# Patient Record
Sex: Female | Born: 1955 | Race: Black or African American | Hispanic: No | Smoking: Never smoker
Health system: Southern US, Community
[De-identification: ages and names within clinical notes are randomized; demographics above are authoritative.]

## PROBLEM LIST (undated history)

## (undated) DIAGNOSIS — Z973 Presence of spectacles and contact lenses: Secondary | ICD-10-CM

## (undated) DIAGNOSIS — K219 Gastro-esophageal reflux disease without esophagitis: Secondary | ICD-10-CM

## (undated) DIAGNOSIS — J45909 Unspecified asthma, uncomplicated: Secondary | ICD-10-CM

## (undated) DIAGNOSIS — E785 Hyperlipidemia, unspecified: Secondary | ICD-10-CM

## (undated) DIAGNOSIS — R011 Cardiac murmur, unspecified: Secondary | ICD-10-CM

## (undated) DIAGNOSIS — D649 Anemia, unspecified: Secondary | ICD-10-CM

## (undated) DIAGNOSIS — G4730 Sleep apnea, unspecified: Secondary | ICD-10-CM

## (undated) DIAGNOSIS — R5383 Other fatigue: Secondary | ICD-10-CM

## (undated) DIAGNOSIS — R0602 Shortness of breath: Secondary | ICD-10-CM

## (undated) DIAGNOSIS — I10 Essential (primary) hypertension: Secondary | ICD-10-CM

## (undated) DIAGNOSIS — C801 Malignant (primary) neoplasm, unspecified: Secondary | ICD-10-CM

## (undated) DIAGNOSIS — T7840XA Allergy, unspecified, initial encounter: Secondary | ICD-10-CM

## (undated) DIAGNOSIS — Z85038 Personal history of other malignant neoplasm of large intestine: Secondary | ICD-10-CM

## (undated) DIAGNOSIS — M549 Dorsalgia, unspecified: Secondary | ICD-10-CM

## (undated) DIAGNOSIS — E559 Vitamin D deficiency, unspecified: Secondary | ICD-10-CM

## (undated) DIAGNOSIS — D219 Benign neoplasm of connective and other soft tissue, unspecified: Secondary | ICD-10-CM

## (undated) DIAGNOSIS — M25475 Effusion, left foot: Secondary | ICD-10-CM

## (undated) DIAGNOSIS — E119 Type 2 diabetes mellitus without complications: Secondary | ICD-10-CM

## (undated) DIAGNOSIS — K635 Polyp of colon: Secondary | ICD-10-CM

## (undated) HISTORY — DX: Benign neoplasm of connective and other soft tissue, unspecified: D21.9

## (undated) HISTORY — DX: Dorsalgia, unspecified: M54.9

## (undated) HISTORY — DX: Hyperlipidemia, unspecified: E78.5

## (undated) HISTORY — DX: Other fatigue: R53.83

## (undated) HISTORY — DX: Unspecified asthma, uncomplicated: J45.909

## (undated) HISTORY — DX: Gastro-esophageal reflux disease without esophagitis: K21.9

## (undated) HISTORY — PX: MYOMECTOMY: SHX85

## (undated) HISTORY — DX: Shortness of breath: R06.02

## (undated) HISTORY — DX: Essential (primary) hypertension: I10

## (undated) HISTORY — DX: Vitamin D deficiency, unspecified: E55.9

## (undated) HISTORY — PX: ABDOMINAL HYSTERECTOMY: SHX81

## (undated) HISTORY — DX: Malignant (primary) neoplasm, unspecified: C80.1

## (undated) HISTORY — DX: Allergy, unspecified, initial encounter: T78.40XA

## (undated) HISTORY — DX: Personal history of other malignant neoplasm of large intestine: Z85.038

## (undated) HISTORY — DX: Anemia, unspecified: D64.9

## (undated) HISTORY — DX: Effusion, left foot: M25.475

---

## 1998-04-17 ENCOUNTER — Other Ambulatory Visit: Admission: RE | Admit: 1998-04-17 | Discharge: 1998-04-17 | Payer: Self-pay | Admitting: Obstetrics and Gynecology

## 1999-10-09 ENCOUNTER — Encounter: Payer: Self-pay | Admitting: Emergency Medicine

## 1999-10-09 ENCOUNTER — Encounter: Admission: RE | Admit: 1999-10-09 | Discharge: 1999-10-09 | Payer: Self-pay | Admitting: Emergency Medicine

## 1999-10-25 ENCOUNTER — Encounter: Admission: RE | Admit: 1999-10-25 | Discharge: 1999-10-25 | Payer: Self-pay | Admitting: Emergency Medicine

## 1999-10-25 ENCOUNTER — Encounter: Payer: Self-pay | Admitting: Emergency Medicine

## 2001-04-21 ENCOUNTER — Other Ambulatory Visit: Admission: RE | Admit: 2001-04-21 | Discharge: 2001-04-21 | Payer: Self-pay | Admitting: Obstetrics and Gynecology

## 2001-05-27 ENCOUNTER — Encounter: Payer: Self-pay | Admitting: Obstetrics and Gynecology

## 2001-05-27 ENCOUNTER — Encounter: Admission: RE | Admit: 2001-05-27 | Discharge: 2001-05-27 | Payer: Self-pay | Admitting: Obstetrics and Gynecology

## 2001-07-14 ENCOUNTER — Inpatient Hospital Stay (HOSPITAL_COMMUNITY): Admission: RE | Admit: 2001-07-14 | Discharge: 2001-07-16 | Payer: Self-pay | Admitting: Obstetrics and Gynecology

## 2001-07-14 ENCOUNTER — Encounter (INDEPENDENT_AMBULATORY_CARE_PROVIDER_SITE_OTHER): Payer: Self-pay | Admitting: Specialist

## 2002-02-14 ENCOUNTER — Inpatient Hospital Stay (HOSPITAL_COMMUNITY): Admission: AD | Admit: 2002-02-14 | Discharge: 2002-02-14 | Payer: Self-pay | Admitting: *Deleted

## 2002-10-12 ENCOUNTER — Encounter: Payer: Self-pay | Admitting: Obstetrics and Gynecology

## 2002-10-12 ENCOUNTER — Encounter: Admission: RE | Admit: 2002-10-12 | Discharge: 2002-10-12 | Payer: Self-pay | Admitting: Obstetrics and Gynecology

## 2003-10-21 ENCOUNTER — Encounter: Admission: RE | Admit: 2003-10-21 | Discharge: 2003-10-21 | Payer: Self-pay | Admitting: Obstetrics and Gynecology

## 2004-11-21 ENCOUNTER — Other Ambulatory Visit: Admission: RE | Admit: 2004-11-21 | Discharge: 2004-11-21 | Payer: Self-pay | Admitting: Obstetrics and Gynecology

## 2004-12-13 ENCOUNTER — Encounter: Admission: RE | Admit: 2004-12-13 | Discharge: 2004-12-13 | Payer: Self-pay | Admitting: Internal Medicine

## 2005-03-26 ENCOUNTER — Encounter: Admission: RE | Admit: 2005-03-26 | Discharge: 2005-03-26 | Payer: Self-pay | Admitting: Allergy and Immunology

## 2005-04-10 ENCOUNTER — Ambulatory Visit (HOSPITAL_COMMUNITY): Admission: RE | Admit: 2005-04-10 | Discharge: 2005-04-10 | Payer: Self-pay | Admitting: Allergy and Immunology

## 2005-05-21 ENCOUNTER — Encounter: Admission: RE | Admit: 2005-05-21 | Discharge: 2005-05-21 | Payer: Self-pay | Admitting: Allergy and Immunology

## 2005-12-06 ENCOUNTER — Other Ambulatory Visit: Admission: RE | Admit: 2005-12-06 | Discharge: 2005-12-06 | Payer: Self-pay | Admitting: Internal Medicine

## 2005-12-26 ENCOUNTER — Encounter: Admission: RE | Admit: 2005-12-26 | Discharge: 2005-12-26 | Payer: Self-pay | Admitting: Internal Medicine

## 2006-12-29 ENCOUNTER — Encounter: Admission: RE | Admit: 2006-12-29 | Discharge: 2006-12-29 | Payer: Self-pay | Admitting: Obstetrics & Gynecology

## 2007-02-24 ENCOUNTER — Encounter: Admission: RE | Admit: 2007-02-24 | Discharge: 2007-02-24 | Payer: Self-pay | Admitting: Obstetrics and Gynecology

## 2007-11-12 ENCOUNTER — Other Ambulatory Visit: Admission: RE | Admit: 2007-11-12 | Discharge: 2007-11-12 | Payer: Self-pay | Admitting: Obstetrics and Gynecology

## 2010-09-21 ENCOUNTER — Encounter
Admission: RE | Admit: 2010-09-21 | Discharge: 2010-09-21 | Payer: Self-pay | Source: Home / Self Care | Attending: Family Medicine | Admitting: Family Medicine

## 2011-01-25 NOTE — H&P (Signed)
[facility]  Patient:    Sharon Allison, Sharon Allison Visit Number: [id number] MRN: [id number]          Service Type: GYN Location: 9300 [id number] Attending Physician:  Jenean Lindau Dictated by:   Laqueta Linden, M.D. Admit Date:  07/14/2001                           History and Physical  IDENTIFYING DATA:             The patient is a 55 year old nulligravida black female with recurrent symptomatic fibroids admitted for total abdominal hysterectomy.  HISTORY OF PRESENT ILLNESS:   The patient is a 55 year old gravida 0, single, African-American female who was initially seen by this physician back in 1997 for multiple large symptomatic fibroids.  At that time, she desired to retain childbearing capabilities.  She subsequently underwent surgery with multiple myomectomies performed in May 1997 with removal of a total of 100 g of fibroids.  She was followed for a couple of years after that and did fairly well with no evidence of regrowth at her last visit in August 1999.  She was then lost to followup and presented again in August of this year.  At that time she reported a six-month history of worsening debilitating menorrhagia with multiple accidents and soak-throughs, especially on days #1 and #2.  At this point, she was almost 55 years of age and determined that she now longer desired childbearing capabilities.  She was anemic with a hemoglobin of 11 and had a fibroid which confirmed multiple serosal and intramural fibroids. Various alternatives were discussed including the possible use of combination versus progesterone-only pills, repeat myomectomy, evaluation for submucosal fibroids with endometrial resection if appropriate, versus hysterectomy.  Due to the patients age and recurrence of fibroids and desire for definitive surgery, she elected to proceed to hysterectomy.  She has been extensively counselled as to the risks, benefits, and alternatives, and  complications including the fact that this would render her permanently and irreversibly sterilized.  She has voiced her understanding and acceptance of all of these risks and is admitted now for same-day surgery.  PAST MEDICAL HISTORY:         1. Iron-deficiency anemia.                               2. Atypical chest, arm, and leg pain with                                  negative evaluation per [facility].  PAST SURGICAL HISTORY:        In 1997, multiple myomectomies as noted above.  PAST OBSTETRICAL HISTORY:     Nulligravida.  PAST GYNECOLOGIC HISTORY:     As noted above.  Pap smear in August 2002 was within normal limits.  Mammogram in September 2002 within normal limits.  STD testing including GC and chlamydia probe were negative as well.  ALLERGIES:                    SULFA causes itching.  IVP DYE causes facial  swelling.  No latex sensitivities.  CURRENT MEDICATIONS:          Multivitamins with iron daily with an additional iron supplement.  TRANSFUSION HISTORY:          Negative.  SOCIAL HISTORY:               The patient is divorced for 10 years.  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  She denies any smoking, alcohol, or illicit drug use.  FAMILY HISTORY:               Positive for hypertension and diabetes in her mother with diabetes also in her grandmother.  Father deceased at the age of 33 of lupus.  One brother is alive and well at the age of 32, one sister, at the age of 29, with anemia.   Otherwise noncontributory.  REVIEW OF SYSTEMS:            Notable as per History of Present Illness and some history of fatigue which she relates to anemia, left-sided body discomfort and numbness with a negative evaluation per her primary care physician which has gradually gotten better per the patients report.  She denies any urinary symptoms, bowel complaints,  cardiovascular, respiratory, or neurologic complaints other than as noted above.  PHYSICAL EXAMINATION: (Prior to admission)  GENERAL:                      Obese black female in no apparent distress.  VITAL SIGNS:                  Height 5 feet 3-1/4 inches, weight 201 pounds. Blood pressure 134/84.  HEENT:                        Grossly negative with slight exophthalmos noted.  NECK:                         Supple without thyromegaly or lymphadenopathy.  CHEST:                        Without spine or CVA tenderness.  HEART:                        Regular rate and rhythm without murmurs, gallops, or rubs.  Carotids +2 and equal without bruit, distal pulses full.  LUNGS:                        Clear to auscultation.  BREASTS:                      Without masses.  ABDOMEN:                      Soft without hepatosplenomegaly.  Pfannenstiel scar well healed.  Suprapubic mass appreciated.  PELVIC:                       A 12-week, irregular, poorly mobile uterus with no obvious adnexal masses.  Rectovaginal confirmatory.  EXTREMITIES:                  Without edema.  NEUROLOGIC:                   Grossly  nonfocal.  LABORATORY DATA:              Studies on admission reveal hemoglobin 12.8, hematocrit 36.9, platelets 312, normal white count of 7.1.  Comprehensive metabolic profile within normal limits with a slightly decreased potassium at 3.4.  Normal coagulation studies.  Urinalysis negative.  EKG and chest x-ray deferred per anesthesia.  IMPRESSION ON ADMISSION:      1. Multiple fibroids status post distant                                  myomectomy with recurrent menorrhagia.                               2. The patient is 55 years old, no desire for                                  childbearing at this time.                               3. Mild anemia, responsive to iron therapy.  PLAN:                         The patient is admitted for same-day surgery. She will  undergo a TAH under general anesthesia.  She has been extensively  counselled as to the risks, benefits, alternatives, and complications and agrees to proceed.  She will receive antibiotic prophylaxis preoperatively. Dictated by:   Laqueta Linden, M.D. Attending Physician:  Jenean Lindau DD:  07/14/01 TD:  07/14/01 Job: [id number]

## 2011-01-25 NOTE — Op Note (Signed)
[facility] [place]  Patient:    Sharon Allison, Sharon Allison Visit Number: [id number] MRN: [id number]          Service Type: GYN Location: 9300 [id number] Attending Physician:  Jenean Lindau Dictated by:   Laqueta Linden, M.D. Proc. Date: 07/14/01 Admit Date:  07/14/2001   CC:         [facility]   Operative Report  PREOPERATIVE DIAGNOSES:       Recurrent leiomyomata uteri.  POSTOPERATIVE DIAGNOSES:      Recurrent leiomyomata uteri, pelvic adhesions.  PROCEDURE:                    Total abdominal hysterectomy with lysis of adhesions.  SURGEON:                      Laqueta Linden, M.D.  ASSISTANT:                    Cheryle Horsfall, M.D.  ANESTHESIA:                   General endotracheal.  ESTIMATED BLOOD LOSS:         100 cc.  URINE OUTPUT:                 500 cc.  FLUIDS:                       1500 cc Crystalloid.  COUNTS:                       Correct x 2.  COMPLICATIONS:                None.  INDICATIONS:                  Sharon Allison is a 55 year old gravida 0 African-American woman who underwent multiple myomectomies by the surgeon in 1997 for multiple large fibroids and menorrhagia.  At that time she desired conservative management due to desire for retention of childbearing capacity. She was lost to followup for several years and presented again in August 2002 at the age of almost 55 reporting that her periods have become prohibitively heavy over the preceding six months which were debilitating with frequent accidents.  Her hemoglobin was noted to be 11 at that time.  She underwent ultrasound which confirmed the presence of multiple small fibroids diffusely involving the uterus.  Both ovaries appeared normal.  A discussion was held with the patient regarding various management alternatives.  A trial of low dose birth control pills to control her menses versus progesterone only pill versus repeat myomectomy versus hysterectomy  were all discussed.  At this point and at this age, she has determined that she will not be having children regardless and elects to proceed to definitive surgical management.  She has been extensively counseled as to the risks, benefits, alternatives, and complications of the procedure including, but not limited to, anesthesia risks, infection, bleeding, possibly requiring transfusion, injury to bowel, bladder, ureters, vessels, nerves, possibility of adhesions from the prior surgery making injury slightly more likely, possibility of fistula formation between the bladder or rectum or small bowel and vagina, postoperative expectations regarding recovery time, sexual functioning, return to work, as well as the permanent and irreversible sterilization as well as risks of DVT, PE, pneumonia, death, and other unnamed risks.  She has seen the informed consent  film and voiced her understanding and acceptance of the above named risks and agrees to proceed.  She has signed the informed permanent sterilization consent as well.  PROCEDURE:                    After proper identification and consents were ascertained she was taken to the operating room and placed on the operating table in supine position.  After the injection of general endotracheal anesthesia she was placed in the broadway position and the abdomen, perineum, and vagina were prepped and draped in a routine sterile fashion.  A transurethral Foley was placed.  She was returned to the supine position and drape was placed appropriately.  A Pfannenstiel incision was then made through the patients prior incision.  This was carried down to the level of the anterior rectus fascia.  The fascia was incised and the incision extended laterally superiorly and inferiorly.  The parietoperitoneum was elevated and incised and the incision extended superiorly and inferiorly to the level of the bladder.  There were noted to be some filmy anterior  peritoneal adhesions to the left side of the incision which were not obstructive in any way to the surgery and these were left in place.  The appendiceal tip was visualized and had no lesion or stone.  It did have some surrounding adhesions.  Upper abdomen was not palpated due to the adhesions around the incision.  Patient was placed in Trendelenburg and a self retaining retractor was placed.  Sharp dissection of small bowel and omental adhesions to the uterine fundus, posterior lower uterine segment were then carefully sharply lysed to mobilize the uterus.  There was also noted to be an anterior peritoneal adhesion of the bladder to the upper portion of the anterior aspect of the uterus which was also carefully lysed.  The uterus was distorted by multiple serosal and intramural fibroids.  Both tubes and ovaries appeared completely normal.  The uterus was elevated in the operative field after mobilization with lysis of adhesions.  The round ligaments were clamped, cut, and suture ligated bilaterally and tagged.  Dissection was carried forward in the anterior leaf of the broad ligament with dissection of the bladder off of the anterior lower uterine segment and cervix.  A window was made in the posterior broad ligament and the proximal utero-ovarian ligament and fallopian tube were then clamped with the pedicle cut and doubly ligated with a free tie and a stitch of 0 Vicryl with retention of both adnexa.  This procedure was performed on both sides.  The uterine vessels were then skeletonized bilaterally and curved Heaney clamps placed perpendicularly across the vessels at the level of the internal os.  Care was taken to advance the bladder well off of the cervix prior to proceeding with the dissection of the cervix.  Pedicles were cut and suture ligated with 0 Vicryl.  A straight Heaney clamp was then placed across the upper portion of the cardinal ligament with the pedicle cut and  suture ligated such that the uterine vessels were doubly ligated within this pedicle  as well.  Additional straight and then curved Heaney clamps were placed across the cardinal ligaments and upper vaginal angles bilaterally.  Satinsky scissors were then used to circumscribe the cervix with removal of the specimen which was sent for final sectioning.  Richardson angle sutures were placed bilaterally with excellent hemostasis noted.  Several additional figure-of-eight sutures of 0 Vicryl were placed with closure of the upper vagina.  Several small bleeding points were cauterized.  The uterosacral ligament tags were tied together to prevent enterocele formation.  Both ureters were visualized and noted to be of normal caliber and peristalsing normally throughout the procedure.  Copious lavage was again accomplished. Both adnexa were suspended to its lateral round ligaments.  A window in the peritoneum on the left was closed to prevent incarceration of bowel or omentum.  All pedicles were noted to be hemostatic.  The vaginal cuff was hemostatic as was the bladder dissection site.  Urine remained clear and copious throughout the procedure.  All instruments and lap packs were then removed.  Needle, sponge, and instrument counts were correct prior to closure of the abdomen.  Parietoperitoneum was closed in a running fashion using 2-0 Vicryl suture taking care to avoid the adhesions along the left aspect which were left in place as previously noted.  The rectus muscles were loosely reapproximated in the midline.  Subfascial hemostasis was ascertained and the fascia was closed from both lateral aspects of the midline using a running stitch of 0 Maxon.  Subcutaneous hemostasis was ascertained.  The skin was closed with staples and Steri-Strips and pressure dressing applied.  Patient was stable on transfer to the recovery room.  Estimated blood loss 100 cc. Urine output 500 cc.  Fluids:  1500 cc  Crystalloid.  Counts correct x 2. Complications:  None.Dictated by:   Laqueta Linden, M.D. Attending Physician:  Jenean Lindau DD:  07/14/01 TD:  07/15/01 Job: [id number]

## 2011-10-11 ENCOUNTER — Other Ambulatory Visit: Payer: Self-pay | Admitting: Family Medicine

## 2011-10-11 DIAGNOSIS — Z1231 Encounter for screening mammogram for malignant neoplasm of breast: Secondary | ICD-10-CM

## 2011-10-23 ENCOUNTER — Ambulatory Visit
Admission: RE | Admit: 2011-10-23 | Discharge: 2011-10-23 | Disposition: A | Discharge status: Home / Self Care | Payer: BC Managed Care – PPO | Source: Ambulatory Visit | Attending: Family Medicine | Admitting: Family Medicine

## 2011-10-23 DIAGNOSIS — Z1231 Encounter for screening mammogram for malignant neoplasm of breast: Secondary | ICD-10-CM

## 2012-12-14 ENCOUNTER — Other Ambulatory Visit: Payer: Self-pay

## 2012-12-14 DIAGNOSIS — Z1231 Encounter for screening mammogram for malignant neoplasm of breast: Secondary | ICD-10-CM

## 2013-01-19 ENCOUNTER — Encounter: Payer: Self-pay | Admitting: Certified Nurse Midwife

## 2013-01-20 ENCOUNTER — Ambulatory Visit (INDEPENDENT_AMBULATORY_CARE_PROVIDER_SITE_OTHER): Payer: BC Managed Care – PPO | Admitting: Certified Nurse Midwife

## 2013-01-20 ENCOUNTER — Encounter: Payer: Self-pay | Admitting: Certified Nurse Midwife

## 2013-01-20 ENCOUNTER — Ambulatory Visit
Admission: RE | Admit: 2013-01-20 | Discharge: 2013-01-20 | Disposition: A | Discharge status: Home / Self Care | Payer: BC Managed Care – PPO | Source: Ambulatory Visit

## 2013-01-20 VITALS — BP 132/62 | HR 68 | Resp 14 | Ht 63.0 in | Wt 215.0 lb

## 2013-01-20 DIAGNOSIS — Z1231 Encounter for screening mammogram for malignant neoplasm of breast: Secondary | ICD-10-CM

## 2013-01-20 DIAGNOSIS — Z0000 Encounter for general adult medical examination without abnormal findings: Secondary | ICD-10-CM

## 2013-01-20 DIAGNOSIS — B373 Candidiasis of vulva and vagina: Secondary | ICD-10-CM

## 2013-01-20 DIAGNOSIS — Z23 Encounter for immunization: Secondary | ICD-10-CM

## 2013-01-20 DIAGNOSIS — B3731 Acute candidiasis of vulva and vagina: Secondary | ICD-10-CM

## 2013-01-20 DIAGNOSIS — Z01419 Encounter for gynecological examination (general) (routine) without abnormal findings: Secondary | ICD-10-CM

## 2013-01-20 LAB — POCT URINALYSIS DIPSTICK

## 2013-01-20 MED ORDER — TERCONAZOLE 0.4 % VA CREA: 1.0000 | TOPICAL_CREAM | Freq: Every day | VAGINAL | Status: DC

## 2013-01-20 MED ORDER — TETANUS-DIPHTH-ACELL PERTUSSIS 5-2.5-18.5 LF-MCG/0.5 IM SUSP: 0.5000 mL | Freq: Once | INTRAMUSCULAR | Status: DC

## 2013-01-20 NOTE — Patient Instructions (Addendum)

## 2013-01-20 NOTE — Progress Notes (Signed)
57 y.o. G0P0000 Divorced African American Fe here for annual exam. Menopausal no HRT.  Reports occasional vaginal dryness, uses OTC with good results.Complaining of vaginal itching internal off and on over the past month.  Not sexually active.  No new personal products. No health issues today.  Sees PCP for aex, medication management and labs.     No LMP recorded. Patient is postmenopausal.          Sexually active: no  The current method of family planning is status post hysterectomy.    Exercising: yes  aerobics Smoker:  no  Health Maintenance: Pap:  11/21/2004  negative MMG:  01/20/2013 Colonoscopy:  12/2006 BMD:  Never, per pt.   TDaP:  Up to date at PCP, per pt.  Labs:PCP does lab (blood) work.     reports that she has never smoked. She has never used smokeless tobacco. She reports that she does not drink alcohol or use illicit drugs.  Past Medical History  Diagnosis Date  . Fibroid   . Anemia   . GERD (gastroesophageal reflux disease)   . Hypertension   . Asthma     Past Surgical History  Procedure Laterality Date  . Myomectomy      multiple fibroids  . Abdominal hysterectomy      2002    Current Outpatient Prescriptions  Medication Sig Dispense Refill  . Lansoprazole (PREVACID PO) Take by mouth daily.      . mometasone (ASMANEX) 220 MCG/INH inhaler Inhale 2 puffs into the lungs daily.      . Multiple Vitamins-Minerals (MULTIVITAMIN PO) Take by mouth daily.      . Cholecalciferol (VITAMIN D PO) Take by mouth daily.      . Montelukast Sodium (SINGULAIR PO) Take by mouth daily.       No current facility-administered medications for this visit.    Family History  Problem Relation Age of Onset  . Hypertension Mother   . Lupus Father   . Hodgkin's lymphoma Maternal Grandmother   . Cancer Maternal Grandfather     prostate    ROS:  Pertinent items are noted in HPI.  Otherwise, a comprehensive ROS was negative.  Exam:   BP 132/62  Pulse 68  Resp 14  Ht 5\' 3"   (1.6 m)  Wt 215 lb (97.523 kg)  BMI 38.09 kg/m2 Height: 5\' 3"  (160 cm)  Ht Readings from Last 3 Encounters:  01/20/13 5\' 3"  (1.6 m)    General appearance: alert, cooperative and appears stated age Head: Normocephalic, without obvious abnormality, atraumatic Neck: no adenopathy, supple, symmetrical, trachea midline and thyroid normal to inspection and palpation Lungs: clear to auscultation bilaterally Breasts: normal appearance, no masses or tenderness, No nipple retraction or dimpling, No nipple discharge or bleeding, No axillary or supraclavicular adenopathy, large, pendulous breasts Heart: regular rate and rhythm Abdomen: soft, non-tender; no masses,  no organomegaly Extremities: extremities normal, atraumatic, no cyanosis or edema Skin: Skin color, texture, turgor normal. No rashes or lesions Lymph nodes: Cervical, supraclavicular, and axillary nodes normal. No abnormal inguinal nodes palpated Neurologic: Grossly normal   Pelvic: External genitalia:  no lesions              Urethra:  normal appearing urethra with no masses, tenderness or lesions              Bartholin's and Skene's: normal                 Vagina: normal appearing vagina with normal color  and white thick discharge, no odor, no lesions Wet prep taken              Cervix: absent              Pap taken: no Bimanual Exam:  Uterus:  uterus absent              Adnexa: normal adnexa and no mass, fullness, tenderness               Rectovaginal: Confirms               Anus:  normal sphincter tone, no lesions Wet Prep: positive for yeast, negative for clue  A:  Well Woman with normal exam  Menopausal, no HRT S/P TAH with ovaries retained due to fibroids  Yeast vaginitis  Immunization update  P: Reviewed health and wellness pertinent to exam    Pap smear as per guidelines, no pap taken   Mammogram yearly  Reviewed findings Rx Terazol see order  Continue to use OTC moisture which well maintain normal vaginal  ph  Requests TDAP counseled on breast self exam, feminine hygiene, adequate intake of calcium and vitamin D, diet and exercise  return annually or prn  An After Visit Summary was printed and given to the patient.  Reviewed, TL

## 2014-01-24 ENCOUNTER — Ambulatory Visit: Payer: BC Managed Care – PPO | Admitting: Certified Nurse Midwife

## 2014-03-08 ENCOUNTER — Encounter: Payer: Self-pay | Admitting: Certified Nurse Midwife

## 2014-03-08 ENCOUNTER — Ambulatory Visit (INDEPENDENT_AMBULATORY_CARE_PROVIDER_SITE_OTHER): Payer: BC Managed Care – PPO | Admitting: Certified Nurse Midwife

## 2014-03-08 VITALS — BP 122/80 | HR 68 | Resp 16 | Ht 62.25 in | Wt 227.0 lb

## 2014-03-08 DIAGNOSIS — Z0000 Encounter for general adult medical examination without abnormal findings: Secondary | ICD-10-CM

## 2014-03-08 DIAGNOSIS — Z01419 Encounter for gynecological examination (general) (routine) without abnormal findings: Secondary | ICD-10-CM

## 2014-03-08 LAB — POCT URINALYSIS DIPSTICK
BILIRUBIN UA: NEGATIVE
Blood, UA: NEGATIVE
Glucose, UA: NEGATIVE
KETONES UA: NEGATIVE
LEUKOCYTES UA: NEGATIVE
NITRITE UA: NEGATIVE
Protein, UA: NEGATIVE
Urobilinogen, UA: NEGATIVE
pH, UA: 5

## 2014-03-08 LAB — HEMOGLOBIN A1C
Hgb A1c MFr Bld: 5.2 % (ref <5.7)
MEAN PLASMA GLUCOSE: 103 mg/dL (ref <117)

## 2014-03-08 NOTE — Patient Instructions (Signed)

## 2014-03-08 NOTE — Progress Notes (Signed)
58 y.o. G0P0000 Divorced African American Fe here for annual exam. Menopausal no HRT. Denies vaginal bleeding or vaginal dryness. Not sexually active. Has not seen PCP in past year and is searching for new one. Requests labs today. Patient complaining of feet and ankle edema at then end of the day. She sits all day at job, elevates at night and resolves. Denies pain in legs. No other health issues today.  Patient's last menstrual period was 07/10/2001.          Sexually active: No.  The current method of family planning is status post hysterectomy.    Exercising: No.  exercise Smoker:  no  Health Maintenance: Pap: 11-21-04 neg MMG:  01/20/13 density category 2, bi-rads category 1:neg Colonoscopy:  4/08 BMD:   none TDaP: 2014 Labs: Poct urine-neg, Hgb-13.2 Self breast exam: done occ   reports that she has never smoked. She has never used smokeless tobacco. She reports that she does not drink alcohol or use illicit drugs.  Past Medical History  Diagnosis Date  . Fibroid   . Anemia   . GERD (gastroesophageal reflux disease)   . Hypertension   . Asthma     Past Surgical History  Procedure Laterality Date  . Myomectomy      multiple fibroids  . Abdominal hysterectomy      2002    Current Outpatient Prescriptions  Medication Sig Dispense Refill  . DiphenhydrAMINE HCl (BENADRYL PO) Take by mouth daily.      . Multiple Vitamins-Minerals (MULTIVITAMIN PO) Take by mouth daily.       No current facility-administered medications for this visit.    Family History  Problem Relation Age of Onset  . Hypertension Mother   . Leukemia Mother   . Lupus Father   . Hodgkin's lymphoma Maternal Grandmother   . Cancer Maternal Grandfather     prostate  . Leukemia Sister   . Kidney disease Brother     ROS:  Pertinent items are noted in HPI.  Otherwise, a comprehensive ROS was negative.  Exam:   BP 122/80  Pulse 68  Resp 16  Ht 5' 2.25" (1.581 m)  Wt 227 lb (102.967 kg)  BMI 41.19  kg/m2  LMP 07/10/2001 Height: 5' 2.25" (158.1 cm)  Ht Readings from Last 3 Encounters:  03/08/14 5' 2.25" (1.581 m)  01/20/13 5\' 3"  (1.6 m)    General appearance: alert, cooperative and appears stated age Head: Normocephalic, without obvious abnormality, atraumatic Neck: no adenopathy, supple, symmetrical, trachea midline and thyroid normal to inspection and palpation and non-palpable Lungs: clear to auscultation bilaterally Breasts: normal appearance, no masses or tenderness, No nipple retraction or dimpling, No nipple discharge or bleeding, No axillary or supraclavicular adenopathy Heart: regular rate and rhythm Abdomen: soft, non-tender; no masses,  no organomegaly Extremities: extremities normal, atraumatic, no cyanosis or edema. Legs pulses equal, no edema noted Skin: Skin color, texture, turgor normal. No rashes or lesions Lymph nodes: Cervical, supraclavicular, and axillary nodes normal. No abnormal inguinal nodes palpated Neurologic: Grossly normal   Pelvic: External genitalia:  no lesions              Urethra:  normal appearing urethra with no masses, tenderness or lesions              Bartholin's and Skene's: normal                 Vagina: normal appearing vagina with normal color and discharge, no lesions  Cervix: absent              Pap taken: No. Bimanual Exam:  Uterus:  normal size, contour, position, consistency, mobility, non-tender and anteverted              Adnexa: normal adnexa and no mass, fullness, tenderness               Rectovaginal: Confirms               Anus:  normal sphincter tone, no lesions  A:  Well Woman with normal exam  Menopausal no HRT S/P TAH due to fibroids  Dependent edema of feet  Screening labs  P:   Reviewed health and wellness pertinent to exam  Discussed elevating feet on stool at work and taking a break frequently to move feet and legs. Watch salt intake, and increase water intake. Warning of edema given.  Labs: Lipid  panel, CMP, Hgb A 1c, TSH  Pap smear not taken today  Given list of PCP's in area   counseled on breast self exam, mammography screening, adequate intake of calcium and vitamin D, diet and exercise  return annually or prn  An After Visit Summary was printed and given to the patient.

## 2014-03-09 LAB — COMPREHENSIVE METABOLIC PANEL
ALK PHOS: 82 U/L (ref 39–117)
ALT: 20 U/L (ref 0–35)
AST: 18 U/L (ref 0–37)
Albumin: 4 g/dL (ref 3.5–5.2)
BILIRUBIN TOTAL: 0.4 mg/dL (ref 0.2–1.2)
BUN: 10 mg/dL (ref 6–23)
CO2: 27 meq/L (ref 19–32)
CREATININE: 0.67 mg/dL (ref 0.50–1.10)
Calcium: 10 mg/dL (ref 8.4–10.5)
Chloride: 102 mEq/L (ref 96–112)
GLUCOSE: 103 mg/dL — AB (ref 70–99)
POTASSIUM: 4.2 meq/L (ref 3.5–5.3)
Sodium: 137 mEq/L (ref 135–145)
Total Protein: 6.7 g/dL (ref 6.0–8.3)

## 2014-03-09 LAB — HEMOGLOBIN, FINGERSTICK: Hemoglobin, fingerstick: 13.2 g/dL (ref 12.0–16.0)

## 2014-03-09 LAB — LIPID PANEL
Cholesterol: 196 mg/dL (ref 0–200)
HDL: 50 mg/dL (ref >39)
LDL Cholesterol: 115 mg/dL — ABNORMAL HIGH (ref 0–99)
TRIGLYCERIDES: 155 mg/dL — AB (ref <150)
Total CHOL/HDL Ratio: 3.9 Ratio
VLDL: 31 mg/dL (ref 0–40)

## 2014-03-09 LAB — TSH: TSH: 1.066 u[IU]/mL (ref 0.350–4.500)

## 2014-03-14 NOTE — Progress Notes (Signed)
Reviewed personally.  M. Suzanne Hollie Wojahn, MD.  

## 2014-11-29 ENCOUNTER — Telehealth: Payer: Self-pay | Admitting: Certified Nurse Midwife

## 2014-11-29 NOTE — Telephone Encounter (Signed)
Left message regarding her appointment for 03/10/15. No longer a 3:30 slot.

## 2015-03-10 ENCOUNTER — Ambulatory Visit: Payer: BC Managed Care – PPO | Admitting: Certified Nurse Midwife

## 2015-03-29 ENCOUNTER — Ambulatory Visit (INDEPENDENT_AMBULATORY_CARE_PROVIDER_SITE_OTHER): Payer: BC Managed Care – PPO | Admitting: Certified Nurse Midwife

## 2015-03-29 ENCOUNTER — Encounter: Payer: Self-pay | Admitting: Certified Nurse Midwife

## 2015-03-29 VITALS — BP 130/80 | HR 80 | Resp 18 | Ht 62.5 in | Wt 226.0 lb

## 2015-03-29 DIAGNOSIS — Z01419 Encounter for gynecological examination (general) (routine) without abnormal findings: Secondary | ICD-10-CM | POA: Diagnosis not present

## 2015-03-29 DIAGNOSIS — Z0000 Encounter for general adult medical examination without abnormal findings: Secondary | ICD-10-CM

## 2015-03-29 LAB — POCT URINALYSIS DIPSTICK
BILIRUBIN UA: NEGATIVE
Blood, UA: NEGATIVE
GLUCOSE UA: NEGATIVE
KETONES UA: NEGATIVE
LEUKOCYTES UA: NEGATIVE
Nitrite, UA: NEGATIVE
Protein, UA: NEGATIVE
UROBILINOGEN UA: NEGATIVE
pH, UA: 5

## 2015-03-29 LAB — LIPID PANEL
CHOL/HDL RATIO: 5.7 ratio
Cholesterol: 232 mg/dL — ABNORMAL HIGH (ref 0–200)
HDL: 41 mg/dL — ABNORMAL LOW (ref >=46)
LDL CALC: 142 mg/dL — AB (ref 0–99)
TRIGLYCERIDES: 245 mg/dL — AB (ref <150)
VLDL: 49 mg/dL — ABNORMAL HIGH (ref 0–40)

## 2015-03-29 LAB — CBC
HCT: 39.6 % (ref 36.0–46.0)
HEMOGLOBIN: 13.7 g/dL (ref 12.0–15.0)
MCH: 30.2 pg (ref 26.0–34.0)
MCHC: 34.6 g/dL (ref 30.0–36.0)
MCV: 87.4 fL (ref 78.0–100.0)
MPV: 9.7 fL (ref 8.6–12.4)
Platelets: 327 10*3/uL (ref 150–400)
RBC: 4.53 MIL/uL (ref 3.87–5.11)
RDW: 13.8 % (ref 11.5–15.5)
WBC: 7.3 10*3/uL (ref 4.0–10.5)

## 2015-03-29 LAB — HEMOGLOBIN A1C
Hgb A1c MFr Bld: 5.4 % (ref <5.7)
MEAN PLASMA GLUCOSE: 108 mg/dL (ref <117)

## 2015-03-29 LAB — COMPREHENSIVE METABOLIC PANEL
ALK PHOS: 74 U/L (ref 39–117)
ALT: 14 U/L (ref 0–35)
AST: 16 U/L (ref 0–37)
Albumin: 4.1 g/dL (ref 3.5–5.2)
BUN: 10 mg/dL (ref 6–23)
CALCIUM: 10.6 mg/dL — AB (ref 8.4–10.5)
CHLORIDE: 101 meq/L (ref 96–112)
CO2: 28 mEq/L (ref 19–32)
Creat: 0.6 mg/dL (ref 0.50–1.10)
GLUCOSE: 77 mg/dL (ref 70–99)
Potassium: 4.3 mEq/L (ref 3.5–5.3)
Sodium: 139 mEq/L (ref 135–145)
TOTAL PROTEIN: 7.2 g/dL (ref 6.0–8.3)
Total Bilirubin: 0.4 mg/dL (ref 0.2–1.2)

## 2015-03-29 LAB — TSH: TSH: 1.843 u[IU]/mL (ref 0.350–4.500)

## 2015-03-29 NOTE — Progress Notes (Signed)
59 y.o. G0P0000 Divorced  African American Fe here for annual exam. Menopausal no HRT. no hot flashes or night sweats. Denies vagina dryness.  Planning on establishing PCP with [facility] , attended a weight loss class that has helped. No other health issues today.  Patient's last menstrual period was 07/10/2001.          Sexually active: No.  The current method of family planning is status post hysterectomy.    Exercising: No.  exercise Smoker:  no  Health Maintenance: Pap:  11-21-04 neg MMG:  2015 per patient negative. Colonoscopy:  4/08 normal  BMD:   None  TDaP:  2014 Labs: poct urine-neg, Hgb-13.6 Self breast exam: done occ   reports that she has never smoked. She has never used smokeless tobacco. She reports that she does not drink alcohol or use illicit drugs.  Past Medical History  Diagnosis Date  . Fibroid   . Anemia   . GERD (gastroesophageal reflux disease)   . Hypertension   . Asthma     Past Surgical History  Procedure Laterality Date  . Myomectomy      multiple fibroids  . Abdominal hysterectomy      2002    Current Outpatient Prescriptions  Medication Sig Dispense Refill  . DiphenhydrAMINE HCl (BENADRYL PO) Take by mouth as needed.     . Multiple Vitamins-Minerals (MULTIVITAMIN PO) Take by mouth daily.     No current facility-administered medications for this visit.    Family History  Problem Relation Age of Onset  . Hypertension Mother   . Leukemia Mother   . Lupus Father   . Hodgkin's lymphoma Maternal Grandmother   . Cancer Maternal Grandfather     prostate  . Leukemia Sister   . Kidney disease Brother   . Leukemia Brother     ROS:  Pertinent items are noted in HPI.  Otherwise, a comprehensive ROS was negative.  Exam:   BP 130/80 mmHg  Pulse 80  Resp 18  Ht 5' 2.5" (1.588 m)  Wt 226 lb (102.513 kg)  BMI 40.65 kg/m2  LMP 07/10/2001 Height: 5' 2.5" (158.8 cm) Ht Readings from Last 3 Encounters:  03/29/15 5' 2.5" (1.588 m)   03/08/14 5' 2.25" (1.581 m)  01/20/13 5\' 3"  (1.6 m)    General appearance: alert, cooperative and appears stated age Head: Normocephalic, without obvious abnormality, atraumatic Neck: no adenopathy, supple, symmetrical, trachea midline and thyroid normal to inspection and palpation Lungs: clear to auscultation bilaterally Breasts: normal appearance, no masses or tenderness, No nipple retraction or dimpling, No nipple discharge or bleeding, No axillary or supraclavicular adenopathy  Large and pendulous Heart: regular rate and rhythm Abdomen: soft, non-tender; no masses,  no organomegaly Extremities: extremities normal, atraumatic, no cyanosis or edema Skin: Skin color, texture, turgor normal. No rashes or lesions Lymph nodes: Cervical, supraclavicular, and axillary nodes normal. No abnormal inguinal nodes palpated Neurologic: Grossly normal   Pelvic: External genitalia:  no lesions              Urethra:  normal appearing urethra with no masses, tenderness or lesions              Bartholin's and Skene's: normal                 Vagina: normal appearing vagina with normal color and discharge, no lesions              Cervix: absent  Pap taken: No. Bimanual Exam:  Uterus:  uterus absent              Adnexa: no mass, fullness, tenderness and adnexa not palpable due to body habitus, no large masses palpated               Rectovaginal: Confirms               Anus:  normal sphincter tone, no lesions  Chaperone present: Yes  A:  Well Woman with normal exam  Menopausal no HRT S/P TAH ovaries retained, fibroids  Morbid obesity working on weight loss  Mammogram overdue  Height loss, BMD due  Screening labs  P:   Reviewed health and wellness pertinent to exam  Encouraged to continue working on weight loss and exercise to help reduce risk of other health issues.  Stressed mammogram yearly, given information to schedule and can schedule BMD at same time. Patient will schedule  !  Labs:Lipid panel, CMP, CBC, Vitamin D, TSH, Hgb A1-c  Pap smear not taken   counseled on breast self exam, mammography screening, adequate intake of calcium and vitamin D, diet and exercise  return annually or prn  An After Visit Summary was printed and given to the patient.

## 2015-03-29 NOTE — Patient Instructions (Signed)

## 2015-03-30 ENCOUNTER — Telehealth: Payer: Self-pay | Admitting: Certified Nurse Midwife

## 2015-03-30 ENCOUNTER — Other Ambulatory Visit: Payer: Self-pay

## 2015-03-30 DIAGNOSIS — E2839 Other primary ovarian failure: Secondary | ICD-10-CM

## 2015-03-30 DIAGNOSIS — Z780 Asymptomatic menopausal state: Secondary | ICD-10-CM

## 2015-03-30 DIAGNOSIS — Z1231 Encounter for screening mammogram for malignant neoplasm of breast: Secondary | ICD-10-CM

## 2015-03-30 LAB — HEMOGLOBIN, FINGERSTICK: Hemoglobin, fingerstick: 13.6 g/dL (ref 12.0–16.0)

## 2015-03-30 LAB — VITAMIN D 25 HYDROXY (VIT D DEFICIENCY, FRACTURES): VIT D 25 HYDROXY: 18 ng/mL — AB (ref 30–100)

## 2015-03-30 NOTE — Progress Notes (Signed)
Reviewed personally.  M. Suzanne Shriyan Arakawa, MD.  

## 2015-03-30 NOTE — Telephone Encounter (Signed)
Spoke with patient. Advised [facility] for BMD [facility] for her appointment on 04/07/2015. Patient is agreeable.  Routing to provider for final review. Patient agreeable to disposition. Will close encounter.

## 2015-03-30 NOTE — Telephone Encounter (Signed)
Patient has a MMG appointment 04/07/15 at the [facility] center. Patient needs an order sent for the BMD appointment. Last seen 03/29/15.

## 2015-03-31 ENCOUNTER — Telehealth: Payer: Self-pay

## 2015-03-31 DIAGNOSIS — R7989 Other specified abnormal findings of blood chemistry: Secondary | ICD-10-CM

## 2015-03-31 NOTE — Telephone Encounter (Signed)
Left message to call Kaitlyn at [phone]. 

## 2015-04-03 MED ORDER — VITAMIN D (ERGOCALCIFEROL) 1.25 MG (50000 UNIT) PO CAPS: 50000.0000 [IU] | ORAL_CAPSULE | ORAL | Status: DC

## 2015-04-03 NOTE — Telephone Encounter (Signed)
Spoke with patient. Advised of results as seen below from [place]. Patient is agreeable and verbalizes understanding. Rx for Vitamin D 50,000 IU every 7 days #8 0RF sent to [facility] off of [address] per patients request. Follow up lab appointment scheduled for 9/22 at 3:30pm. Agreeable to date and time. Patient would like to schedule appointment with new PCP. Will call to let our office know of provider and location so that labs can be faxed.    Notes Recorded by Jasmine Awe, RN on 03/31/2015 at 8:31 AM Left message to call [place] at [phone].  Notes Recorded by Regina Eck, CNM on 03/30/2015 at 7:48 AM Notify patient that Vitamin D is low protocol which contributes to fatigue Lipid panel is elevated with borderline high cholesterol Triglycerides are elevated again, LDL is borderline high( harmful cholesterol) needs PCP management follow up with scheduling with PCP and take labs from our office with her. Liver, kidney,glucose profile normal, except slight elevation of calcium, if taking calcium needs to stop Hgb. A1-c is normal,  CBC is Normal TSH is normal  Routing to provider for final review. Patient agreeable to disposition. Will close encounter.   Patient aware provider will review message and nurse will return call if any additional advice or change of disposition.

## 2015-04-07 ENCOUNTER — Ambulatory Visit
Admission: RE | Admit: 2015-04-07 | Discharge: 2015-04-07 | Disposition: A | Discharge status: Home / Self Care | Payer: BC Managed Care – PPO | Source: Ambulatory Visit

## 2015-04-07 DIAGNOSIS — Z1231 Encounter for screening mammogram for malignant neoplasm of breast: Secondary | ICD-10-CM

## 2015-04-25 ENCOUNTER — Ambulatory Visit
Admission: RE | Admit: 2015-04-25 | Discharge: 2015-04-25 | Disposition: A | Discharge status: Home / Self Care | Payer: BC Managed Care – PPO | Source: Ambulatory Visit | Attending: Certified Nurse Midwife | Admitting: Certified Nurse Midwife

## 2015-04-25 DIAGNOSIS — Z780 Asymptomatic menopausal state: Secondary | ICD-10-CM

## 2015-04-25 DIAGNOSIS — E2839 Other primary ovarian failure: Secondary | ICD-10-CM

## 2015-05-02 ENCOUNTER — Telehealth: Payer: Self-pay

## 2015-05-02 NOTE — Telephone Encounter (Signed)
Notified patient that her bone density was normal. See scanned in results.

## 2015-05-30 ENCOUNTER — Telehealth: Payer: Self-pay | Admitting: Certified Nurse Midwife

## 2015-05-30 DIAGNOSIS — Z0189 Encounter for other specified special examinations: Secondary | ICD-10-CM

## 2015-05-30 NOTE — Telephone Encounter (Signed)
Patient has a lab appointment 06/06/15 for vitamin d. Patient would like to add cholesterol to her labs.

## 2015-05-30 NOTE — Telephone Encounter (Signed)
OK to add lipid panel as long as she is fasting 12 hours.  Water or black coffee only after 8:30 pm.

## 2015-05-30 NOTE — Telephone Encounter (Signed)
Kem Boroughs, FNP patient would like to have lipid panel checked with vitamin D recheck. Okay to add this order to labs?

## 2015-05-31 NOTE — Telephone Encounter (Signed)
Left message to call Maryem Shuffler at [phone]. 

## 2015-05-31 NOTE — Telephone Encounter (Signed)
Spoke with patient. Advised of message as seen below from Kem Boroughs, [place]. Patient is agreeable. Order placed for lipid panel.  Routing to provider for final review. Patient agreeable to disposition. Will close encounter.

## 2015-06-01 ENCOUNTER — Other Ambulatory Visit: Payer: BC Managed Care – PPO

## 2015-06-06 ENCOUNTER — Other Ambulatory Visit (INDEPENDENT_AMBULATORY_CARE_PROVIDER_SITE_OTHER): Payer: BC Managed Care – PPO

## 2015-06-06 DIAGNOSIS — Z0000 Encounter for general adult medical examination without abnormal findings: Secondary | ICD-10-CM

## 2015-06-06 DIAGNOSIS — R7989 Other specified abnormal findings of blood chemistry: Secondary | ICD-10-CM

## 2015-06-06 DIAGNOSIS — Z0189 Encounter for other specified special examinations: Secondary | ICD-10-CM

## 2015-06-06 LAB — LIPID PANEL
CHOL/HDL RATIO: 5 ratio (ref <=5.0)
CHOLESTEROL: 203 mg/dL — AB (ref 125–200)
HDL: 41 mg/dL — ABNORMAL LOW (ref >=46)
LDL CALC: 133 mg/dL — AB (ref <130)
Triglycerides: 147 mg/dL (ref <150)
VLDL: 29 mg/dL (ref <30)

## 2015-06-06 NOTE — Addendum Note (Signed)
Addended by: Graylon Good on: 06/06/2015 08:47 AM   Modules accepted: Orders

## 2015-06-07 ENCOUNTER — Telehealth: Payer: Self-pay

## 2015-06-07 LAB — VITAMIN D 25 HYDROXY (VIT D DEFICIENCY, FRACTURES): Vit D, 25-Hydroxy: 24 ng/mL — ABNORMAL LOW (ref 30–100)

## 2015-06-07 NOTE — Telephone Encounter (Signed)
[email or web address]

## 2015-06-07 NOTE — Telephone Encounter (Signed)
-----   Message from Regina Eck, CNM sent at 06/07/2015 12:13 PM EDT ----- Notify patient that vitamin D is low needs protocol Cholesterol is better than previous year Cholesterol is 203 from 232 Triglycerides level is 147 from 245 HDL is same at 41 low needs to elevate with exercise LDL is 133 from 142, which is optimal level Keep working on weight management and exercise to improve this recheck one year

## 2015-06-08 NOTE — Telephone Encounter (Signed)
Left message for call back.

## 2015-06-09 NOTE — Telephone Encounter (Signed)
Left message to call back  

## 2015-06-09 NOTE — Telephone Encounter (Signed)
Patient returning your call.

## 2015-06-13 NOTE — Telephone Encounter (Signed)
Left message for call back.

## 2015-06-14 NOTE — Telephone Encounter (Signed)
Returning call.

## 2015-06-14 NOTE — Telephone Encounter (Signed)
Patient notified of results as written by provider 

## 2016-03-26 ENCOUNTER — Other Ambulatory Visit: Payer: Self-pay | Admitting: Certified Nurse Midwife

## 2016-03-26 DIAGNOSIS — Z1231 Encounter for screening mammogram for malignant neoplasm of breast: Secondary | ICD-10-CM

## 2016-03-29 ENCOUNTER — Encounter: Payer: Self-pay | Admitting: Certified Nurse Midwife

## 2016-03-29 ENCOUNTER — Ambulatory Visit (INDEPENDENT_AMBULATORY_CARE_PROVIDER_SITE_OTHER): Payer: BC Managed Care – PPO | Admitting: Certified Nurse Midwife

## 2016-03-29 VITALS — BP 120/80 | HR 72 | Resp 16 | Ht 62.25 in | Wt 236.0 lb

## 2016-03-29 DIAGNOSIS — Z0000 Encounter for general adult medical examination without abnormal findings: Secondary | ICD-10-CM

## 2016-03-29 DIAGNOSIS — Z01419 Encounter for gynecological examination (general) (routine) without abnormal findings: Secondary | ICD-10-CM | POA: Diagnosis not present

## 2016-03-29 LAB — POCT URINALYSIS DIPSTICK
Bilirubin, UA: NEGATIVE
Glucose, UA: NEGATIVE
Ketones, UA: NEGATIVE
Leukocytes, UA: NEGATIVE
NITRITE UA: NEGATIVE
PROTEIN UA: NEGATIVE
RBC UA: NEGATIVE
UROBILINOGEN UA: NEGATIVE
pH, UA: 5

## 2016-03-29 LAB — CBC
HCT: 39 % (ref 35.0–45.0)
Hemoglobin: 13.3 g/dL (ref 11.7–15.5)
MCH: 30 pg (ref 27.0–33.0)
MCHC: 34.1 g/dL (ref 32.0–36.0)
MCV: 87.8 fL (ref 80.0–100.0)
MPV: 10 fL (ref 7.5–12.5)
PLATELETS: 309 10*3/uL (ref 140–400)
RBC: 4.44 MIL/uL (ref 3.80–5.10)
RDW: 13.6 % (ref 11.0–15.0)
WBC: 8.1 10*3/uL (ref 3.8–10.8)

## 2016-03-29 LAB — TSH: TSH: 0.96 m[IU]/L

## 2016-03-29 NOTE — Progress Notes (Signed)
60 y.o. G0P0000 Divorced  African American Fe here for annual exam. Menopausal  No HRT. Denies vaginal bleeding or vaginal dryness. Has not been on vegetable and lean meat diet in a while and has gained 10 pounds. Patient has started again and plans to work on exercise, due to her job sitting all the time. Seeing [place] with PCP prn but no labs. Screening labs desired. No other health issues today.  Patient's last menstrual period was 07/10/2001.          Sexually active: No.  The current method of family planning is status post hysterectomy.    Exercising: No.  exercise Smoker:  no  Health Maintenance: Pap: 11-21-04 neg MMG:  04-07-15 category b density birads 1:neg, scheduled for 04-08-16 Colonoscopy:  4/07 BMD:   2016 TDaP:  2014 Shingles: no Pneumonia: no Hep C and HIV: HIV neg yrs ago Labs: poct urine-neg Self breast exam: done occ   reports that she has never smoked. She has never used smokeless tobacco. She reports that she does not drink alcohol or use illicit drugs.  Past Medical History  Diagnosis Date  . Fibroid   . Anemia   . GERD (gastroesophageal reflux disease)   . Hypertension   . Asthma     Past Surgical History  Procedure Laterality Date  . Myomectomy      multiple fibroids  . Abdominal hysterectomy      2002    Current Outpatient Prescriptions  Medication Sig Dispense Refill  . Ascorbic Acid (VITAMIN C PO) Take by mouth daily.    . Multiple Vitamins-Minerals (MULTIVITAMIN PO) Take by mouth daily.    . Probiotic Product (PROBIOTIC PO) Take by mouth daily.    . Pseudoephedrine HCl (SUPHEDRINE PO) Take by mouth as needed.     No current facility-administered medications for this visit.    Family History  Problem Relation Age of Onset  . Hypertension Mother   . Leukemia Mother   . Lupus Father   . Hodgkin's lymphoma Maternal Grandmother   . Cancer Maternal Grandfather     prostate  . Leukemia Sister   . Kidney disease Brother   . Leukemia  Brother     ROS:  Pertinent items are noted in HPI.  Otherwise, a comprehensive ROS was negative.  Exam:   BP 120/80 mmHg  Pulse 72  Resp 16  Ht 5' 2.25" (1.581 m)  Wt 236 lb (107.049 kg)  BMI 42.83 kg/m2  LMP 07/10/2001 Height: 5' 2.25" (158.1 cm) Ht Readings from Last 3 Encounters:  03/29/16 5' 2.25" (1.581 m)  03/29/15 5' 2.5" (1.588 m)  03/08/14 5' 2.25" (1.581 m)    General appearance: alert, cooperative and appears stated age Head: Normocephalic, without obvious abnormality, atraumatic Neck: no adenopathy, supple, symmetrical, trachea midline and thyroid normal to inspection and palpation Lungs: clear to auscultation bilaterally Breasts: normal appearance, no masses or tenderness, No nipple retraction or dimpling, No nipple discharge or bleeding, No axillary or supraclavicular adenopathy Heart: regular rate and rhythm Abdomen: soft, non-tender; no masses,  no organomegaly Extremities: extremities normal, atraumatic, no cyanosis or edema Skin: Skin color, texture, turgor normal. No rashes or lesions Lymph nodes: Cervical, supraclavicular, and axillary nodes normal. No abnormal inguinal nodes palpated Neurologic: Grossly normal   Pelvic: External genitalia:  no lesions              Urethra:  normal appearing urethra with no masses, tenderness or lesions  Bartholin's and Skene's: normal                 Vagina: normal appearing vagina with normal color and discharge, no lesions              Cervix: absent              Pap taken: No. Bimanual Exam:  Uterus:  uterus absent              Adnexa: no mass, fullness, tenderness and adnexa not palpable , limited by body habitus               Rectovaginal: Confirms               Anus:  normal sphincter tone, no lesions  Chaperone present: yes  A:  Well Woman with normal exam  Menopausal no HRT  Morbid obesity  Screening labs  Colonoscopy due  P:   Reviewed health and wellness pertinent to exam  Discussed  increase in weight has influence on increase risk of other health issues. Patient will try to work exercise with her diet.  Lab: CBC,CMP, Hgb A1-C, Lipid panel, TSH, Vitamin    D  Patient will call to schedule with Dr. Collene Mares( saw before)  Pap smear as above not taken   counseled on breast self exam, mammography screening, adequate intake of calcium and vitamin D, diet and exercise  return annually or prn  An After Visit Summary was printed and given to the patient.

## 2016-03-29 NOTE — Patient Instructions (Signed)
EXERCISE AND DIET:  We recommended that you start or continue a regular exercise program for good health. Regular exercise means any activity that makes your heart beat faster and makes you sweat.  We recommend exercising at least 30 minutes per day at least 3 days a week, preferably 4 or 5.  We also recommend a diet low in fat and sugar.  Inactivity, poor dietary choices and obesity can cause diabetes, heart attack, stroke, and kidney damage, among others.    ALCOHOL AND SMOKING:  Women should limit their alcohol intake to no more than 7 drinks/beers/glasses of wine (combined, not each!) per week. Moderation of alcohol intake to this level decreases your risk of breast cancer and liver damage. And of course, no recreational drugs are part of a healthy lifestyle.  And absolutely no smoking or even second hand smoke. Most people know smoking can cause heart and lung diseases, but did you know it also contributes to weakening of your bones? Aging of your skin?  Yellowing of your teeth and nails?  CALCIUM AND VITAMIN D:  Adequate intake of calcium and Vitamin D are recommended.  The recommendations for exact amounts of these supplements seem to change often, but generally speaking 600 mg of calcium (either carbonate or citrate) and 800 units of Vitamin D per day seems prudent. Certain women may benefit from higher intake of Vitamin D.  If you are among these women, your doctor will have told you during your visit.    PAP SMEARS:  Pap smears, to check for cervical cancer or precancers,  have traditionally been done yearly, although recent scientific advances have shown that most women can have pap smears less often.  However, every woman still should have a physical exam from her gynecologist every year. It will include a breast check, inspection of the vulva and vagina to check for abnormal growths or skin changes, a visual exam of the cervix, and then an exam to evaluate the size and shape of the uterus and  ovaries.  And after 60 years of age, a rectal exam is indicated to check for rectal cancers. We will also provide age appropriate advice regarding health maintenance, like when you should have certain vaccines, screening for sexually transmitted diseases, bone density testing, colonoscopy, mammograms, etc.   MAMMOGRAMS:  All women over 40 years old should have a yearly mammogram. Many facilities now offer a "3D" mammogram, which may cost around $50 extra out of pocket. If possible,  we recommend you accept the option to have the 3D mammogram performed.  It both reduces the number of women who will be called back for extra views which then turn out to be normal, and it is better than the routine mammogram at detecting truly abnormal areas.    COLONOSCOPY:  Colonoscopy to screen for colon cancer is recommended for all women at age 50.  We know, you hate the idea of the prep.  We agree, BUT, having colon cancer and not knowing it is worse!!  Colon cancer so often starts as a polyp that can be seen and removed at colonscopy, which can quite literally save your life!  And if your first colonoscopy is normal and you have no family history of colon cancer, most women don't have to have it again for 10 years.  Once every ten years, you can do something that may end up saving your life, right?  We will be happy to help you get it scheduled when you are ready.    Be sure to check your insurance coverage so you understand how much it will cost.  It may be covered as a preventative service at no cost, but you should check your particular policy.       Calorie Counting for Weight Loss Calories are energy you get from the things you eat and drink. Your body uses this energy to keep you going throughout the day. The number of calories you eat affects your weight. When you eat more calories than your body needs, your body stores the extra calories as fat. When you eat fewer calories than your body needs, your body burns fat  to get the energy it needs. Calorie counting means keeping track of how many calories you eat and drink each day. If you make sure to eat fewer calories than your body needs, you should lose weight. In order for calorie counting to work, you will need to eat the number of calories that are right for you in a day to lose a healthy amount of weight per week. A healthy amount of weight to lose per week is usually 1-2 lb (0.5-0.9 kg). A dietitian can determine how many calories you need in a day and give you suggestions on how to reach your calorie goal.  WHAT IS MY MY PLAN? My goal is to have __________ calories per day.  If I have this many calories per day, I should lose around __________ pounds per week. WHAT DO I NEED TO KNOW ABOUT CALORIE COUNTING? In order to meet your daily calorie goal, you will need to:  Find out how many calories are in each food you would like to eat. Try to do this before you eat.  Decide how much of the food you can eat.  Write down what you ate and how many calories it had. Doing this is called keeping a food log. WHERE DO I FIND CALORIE INFORMATION? The number of calories in a food can be found on a Nutrition Facts label. Note that all the information on a label is based on a specific serving of the food. If a food does not have a Nutrition Facts label, try to look up the calories online or ask your dietitian for help. HOW DO I DECIDE HOW MUCH TO EAT? To decide how much of the food you can eat, you will need to consider both the number of calories in one serving and the size of one serving. This information can be found on the Nutrition Facts label. If a food does not have a Nutrition Facts label, look up the information online or ask your dietitian for help. Remember that calories are listed per serving. If you choose to have more than one serving of a food, you will have to multiply the calories per serving by the amount of servings you plan to eat. For example, the  label on a package of bread might say that a serving size is 1 slice and that there are 90 calories in a serving. If you eat 1 slice, you will have eaten 90 calories. If you eat 2 slices, you will have eaten 180 calories. HOW DO I KEEP A FOOD LOG? After each meal, record the following information in your food log:  What you ate.  How much of it you ate.  How many calories it had.  Then, add up your calories. Keep your food log near you, such as in a small notebook in your pocket. Another option is to use a  mobile app or website. Some programs will calculate calories for you and show you how many calories you have left each time you add an item to the log. WHAT ARE SOME CALORIE COUNTING TIPS?  Use your calories on foods and drinks that will fill you up and not leave you hungry. Some examples of this include foods like nuts and nut butters, vegetables, lean proteins, and high-fiber foods (more than 5 g fiber per serving).  Eat nutritious foods and avoid empty calories. Empty calories are calories you get from foods or beverages that do not have many nutrients, such as candy and soda. It is better to have a nutritious high-calorie food (such as an avocado) than a food with few nutrients (such as a bag of chips).  Know how many calories are in the foods you eat most often. This way, you do not have to look up how many calories they have each time you eat them.  Look out for foods that may seem like low-calorie foods but are really high-calorie foods, such as baked goods, soda, and fat-free candy.  Pay attention to calories in drinks. Drinks such as sodas, specialty coffee drinks, alcohol, and juices have a lot of calories yet do not fill you up. Choose low-calorie drinks like water and diet drinks.  Focus your calorie counting efforts on higher calorie items. Logging the calories in a garden salad that contains only vegetables is less important than calculating the calories in a milk  shake.  Find a way of tracking calories that works for you. Get creative. Most people who are successful find ways to keep track of how much they eat in a day, even if they do not count every calorie. WHAT ARE SOME PORTION CONTROL TIPS?  Know how many calories are in a serving. This will help you know how many servings of a certain food you can have.  Use a measuring cup to measure serving sizes. This is helpful when you start out. With time, you will be able to estimate serving sizes for some foods.  Take some time to put servings of different foods on your favorite plates, bowls, and cups so you know what a serving looks like.  Try not to eat straight from a bag or box. Doing this can lead to overeating. Put the amount you would like to eat in a cup or on a plate to make sure you are eating the right portion.  Use smaller plates, glasses, and bowls to prevent overeating. This is a quick and easy way to practice portion control. If your plate is smaller, less food can fit on it.  Try not to multitask while eating, such as watching TV or using your computer. If it is time to eat, sit down at a table and enjoy your food. Doing this will help you to start recognizing when you are full. It will also make you more aware of what and how much you are eating. HOW CAN I CALORIE COUNT WHEN EATING OUT?  Ask for smaller portion sizes or child-sized portions.  Consider sharing an entree and sides instead of getting your own entree.  If you get your own entree, eat only half. Ask for a box at the beginning of your meal and put the rest of your entree in it so you are not tempted to eat it.  Look for the calories on the menu. If calories are listed, choose the lower calorie options.  Choose dishes that include vegetables, fruits,  whole grains, low-fat dairy products, and lean protein. Focusing on smart food choices from each of the 5 food groups can help you stay on track at restaurants.  Choose items  that are boiled, broiled, grilled, or steamed.  Choose water, milk, unsweetened iced tea, or other drinks without added sugars. If you want an alcoholic beverage, choose a lower calorie option. For example, a regular margarita can have up to 700 calories and a glass of wine has around 150.  Stay away from items that are buttered, battered, fried, or served with cream sauce. Items labeled "crispy" are usually fried, unless stated otherwise.  Ask for dressings, sauces, and syrups on the side. These are usually very high in calories, so do not eat much of them.  Watch out for salads. Many people think salads are a healthy option, but this is often not the case. Many salads come with bacon, fried chicken, lots of cheese, fried chips, and dressing. All of these items have a lot of calories. If you want a salad, choose a garden salad and ask for grilled meats or steak. Ask for the dressing on the side, or ask for olive oil and vinegar or lemon to use as dressing.  Estimate how many servings of a food you are given. For example, a serving of cooked rice is  cup or about the size of half a tennis ball or one cupcake wrapper. Knowing serving sizes will help you be aware of how much food you are eating at restaurants. The list below tells you how big or small some common portion sizes are based on everyday objects.  1 oz--4 stacked dice.  3 oz--1 deck of cards.  1 tsp--1 dice.  1 Tbsp-- a Ping-Pong ball.  2 Tbsp--1 Ping-Pong ball.   cup--1 tennis ball or 1 cupcake wrapper.  1 cup--1 baseball.   This information is not intended to replace advice given to you by your health care provider. Make sure you discuss any questions you have with your health care provider.   Document Released: 08/26/2005 Document Revised: 09/16/2014 Document Reviewed: 07/01/2013 Elsevier Interactive Patient Education [employer].

## 2016-03-30 LAB — COMPREHENSIVE METABOLIC PANEL
ALT: 21 U/L (ref 6–29)
AST: 20 U/L (ref 10–35)
Albumin: 4.1 g/dL (ref 3.6–5.1)
Alkaline Phosphatase: 76 U/L (ref 33–130)
BUN: 13 mg/dL (ref 7–25)
CALCIUM: 10.3 mg/dL (ref 8.6–10.4)
CO2: 25 mmol/L (ref 20–31)
Chloride: 102 mmol/L (ref 98–110)
Creat: 0.62 mg/dL (ref 0.50–1.05)
GLUCOSE: 81 mg/dL (ref 65–99)
POTASSIUM: 4.3 mmol/L (ref 3.5–5.3)
Sodium: 138 mmol/L (ref 135–146)
Total Bilirubin: 0.4 mg/dL (ref 0.2–1.2)
Total Protein: 6.8 g/dL (ref 6.1–8.1)

## 2016-03-30 LAB — LIPID PANEL
CHOL/HDL RATIO: 3.9 ratio (ref <=5.0)
Cholesterol: 202 mg/dL — ABNORMAL HIGH (ref 125–200)
HDL: 52 mg/dL (ref >=46)
LDL CALC: 117 mg/dL (ref <130)
Triglycerides: 166 mg/dL — ABNORMAL HIGH (ref <150)
VLDL: 33 mg/dL — ABNORMAL HIGH (ref <30)

## 2016-03-30 LAB — HEMOGLOBIN A1C
HEMOGLOBIN A1C: 5.3 % (ref <5.7)
Mean Plasma Glucose: 105 mg/dL

## 2016-03-30 LAB — HEPATITIS C ANTIBODY: HCV AB: NEGATIVE

## 2016-03-30 LAB — VITAMIN D 25 HYDROXY (VIT D DEFICIENCY, FRACTURES): Vit D, 25-Hydroxy: 26 ng/mL — ABNORMAL LOW (ref 30–100)

## 2016-04-02 ENCOUNTER — Telehealth: Payer: Self-pay

## 2016-04-02 NOTE — Telephone Encounter (Signed)
Patient returned call

## 2016-04-02 NOTE — Telephone Encounter (Signed)
[email or web address]

## 2016-04-02 NOTE — Telephone Encounter (Signed)
-----   Message from Regina Eck, CNM sent at 04/01/2016  8:13 AM EDT ----- Notify patient that Vitamin D is low needs protocol CBC and TSH are normal Hep C. Is negative Lipid panel borderline elevated cholesterol at 202 < 200 is normal Triglycerides are slightly elevated, but non fasting and Hgb A1-C is normal, so no concerns, HDL is normal and so is LDL Liver, kidney and glucose panel is notmal

## 2016-04-02 NOTE — Telephone Encounter (Signed)
Patient notified of results.

## 2016-04-03 NOTE — Progress Notes (Signed)
Encounter reviewed Bree Heinzelman, MD   

## 2016-04-08 ENCOUNTER — Ambulatory Visit
Admission: RE | Admit: 2016-04-08 | Discharge: 2016-04-08 | Disposition: A | Discharge status: Home / Self Care | Payer: BC Managed Care – PPO | Source: Ambulatory Visit | Attending: Certified Nurse Midwife | Admitting: Certified Nurse Midwife

## 2016-04-08 DIAGNOSIS — Z1231 Encounter for screening mammogram for malignant neoplasm of breast: Secondary | ICD-10-CM

## 2017-02-13 ENCOUNTER — Encounter: Payer: Self-pay | Admitting: Allergy

## 2017-02-13 ENCOUNTER — Ambulatory Visit (INDEPENDENT_AMBULATORY_CARE_PROVIDER_SITE_OTHER): Payer: BC Managed Care – PPO | Admitting: Allergy

## 2017-02-13 ENCOUNTER — Other Ambulatory Visit: Payer: Self-pay | Admitting: *Deleted

## 2017-02-13 VITALS — BP 128/80 | HR 76 | Temp 98.6°F | Resp 20 | Ht 63.25 in | Wt 239.2 lb

## 2017-02-13 DIAGNOSIS — J309 Allergic rhinitis, unspecified: Secondary | ICD-10-CM | POA: Diagnosis not present

## 2017-02-13 DIAGNOSIS — J4520 Mild intermittent asthma, uncomplicated: Secondary | ICD-10-CM | POA: Diagnosis not present

## 2017-02-13 DIAGNOSIS — H1010 Acute atopic conjunctivitis, unspecified eye: Secondary | ICD-10-CM | POA: Diagnosis not present

## 2017-02-13 MED ORDER — ALBUTEROL SULFATE HFA 108 (90 BASE) MCG/ACT IN AERS: 2.0000 | INHALATION_SPRAY | RESPIRATORY_TRACT | 1 refills | Status: DC | PRN

## 2017-02-13 MED ORDER — BUDESONIDE-FORMOTEROL FUMARATE 160-4.5 MCG/ACT IN AERO: 2.0000 | INHALATION_SPRAY | Freq: Two times a day (BID) | RESPIRATORY_TRACT | 5 refills | Status: DC

## 2017-02-13 NOTE — Progress Notes (Signed)
New Patient Note  RE: Sharon Allison MRN: [id number] DOB: May 01, 1956 Date of Office Visit: 02/13/2017  Referring provider: No ref. provider found Primary care provider: Patient, No Pcp Per  Chief Complaint: allergies and breathing issues  History of present illness: Sharon Allison is a 61 y.o. female presenting today for evaluation of allergies and asthma.  She is a former patient with last visit around 2012.  She reports she was doing well which is why she did not follow-up routinely however  In May she had worsening of her allergies and asthma.     She had issues with coughing, nasal drainage, itching eyes, headaches in May.  She went to [facility] for these symptoms. She received a depo-medrol injection and nebulizer treatment in office.  She was provided with month supply with singulair, claritin and patanol.   She does not feel like claritin was helping.  She reports she has run of her month supply.    Several days after her first UC visit she developed wheezing and chest tightness and she went back to UC due to asthma attack.  She was treated with albuterol treatment and prescribed prednisone course and an Z-pak.    She states the symptoms are coming back since she has run out of the allergy medications.  She wakes up with a headache with achy ears, coughing and nasal drainage.  She reports she been using albuterol every 4 hours since her UC visit as she was advised to do this.          Review of systems: Review of Systems  Constitutional: Negative for chills, fever and malaise/fatigue.  HENT: Positive for congestion. Negative for ear discharge, ear pain, nosebleeds, sore throat and tinnitus.   Eyes: Positive for redness. Negative for pain and discharge.  Respiratory: Positive for cough, shortness of breath and wheezing.   Cardiovascular: Negative for chest pain.  Gastrointestinal: Negative for abdominal pain, constipation, diarrhea, heartburn, nausea and vomiting.  Musculoskeletal:  Negative for joint pain and myalgias.  Skin: Negative for itching and rash.  Neurological: Positive for headaches.    All other systems negative unless noted above in HPI  Past medical history: Past Medical History:  Diagnosis Date  . Anemia   . Asthma   . Fibroid   . GERD (gastroesophageal reflux disease)   . Hypertension     Past surgical history: Past Surgical History:  Procedure Laterality Date  . ABDOMINAL HYSTERECTOMY     2002  . MYOMECTOMY     multiple fibroids    Family history:  Family History  Problem Relation Age of Onset  . Hypertension Mother   . Leukemia Mother   . Lupus Father   . Hodgkin's lymphoma Maternal Grandmother   . Cancer Maternal Grandfather        prostate  . Leukemia Sister   . Kidney disease Brother   . Leukemia Brother   . Allergic rhinitis Neg Hx   . Asthma Neg Hx   . Eczema Neg Hx   . Urticaria Neg Hx     Social history: She lives in a home with carpeting with gas heating and central cooling. There are no pets in the home. There is no concern for water damage or mildew or roaches in the home. 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 She has no smoking history   Medication List: Allergies as of 02/13/2017      Reactions  Iodine Swelling   Sulfa Antibiotics Itching      Medication List       Accurate as of 02/13/17  7:23 PM. Always use your most recent med list.          albuterol 108 (90 Base) MCG/ACT inhaler Commonly known as:  PROVENTIL HFA;VENTOLIN HFA Inhale into the lungs.   fluticasone 50 MCG/ACT nasal spray Commonly known as:  FLONASE 1 spray by Each Nare route daily for 30 days.   loratadine 5 MG chewable tablet Commonly known as:  CLARITIN Chew by mouth.   montelukast 5 MG chewable tablet Commonly known as:  SINGULAIR Chew 10 mg by mouth.   MULTIVITAMIN PO Take by mouth daily.   olopatadine 0.1 % ophthalmic solution Commonly known as:   PATANOL Place 1 drop into both eyes 2 times daily for 30 days.   PROBIOTIC PO Take by mouth daily.   VITAMIN C PO Take by mouth daily.   Vitamin D (Ergocalciferol) 50000 units Caps capsule Commonly known as:  DRISDOL Take by mouth.   Vitamin-B Complex Tabs Take by mouth.       Known medication allergies: Allergies  Allergen Reactions  . Iodine Swelling  . Sulfa Antibiotics Itching     Physical examination: Blood pressure 128/80, pulse 76, temperature 98.6 F (37 C), temperature source Oral, resp. rate 20, height 5' 3.25" (1.607 m), weight 239 lb 3.2 oz (108.5 kg), last menstrual period 07/10/2001.  General: Alert, interactive, in no acute distress. HEENT: PERRLA, TMs pearly gray, turbinates moderately edematous with clear discharge, post-pharynx non erythematous. Neck: Supple without lymphadenopathy. Lungs: Decreased breath sounds with expiratory wheezing bilaterally. {no increased work of breathing. She was provided with a DuoNeb and had slight improvement in amount of wheezing throughout with increased aeration. CV: Normal S1, S2 without murmurs. Abdomen: Nondistended, nontender. Skin: Warm and dry, without lesions or rashes. Extremities:  No clubbing, cyanosis or edema. Neuro:   Grossly intact.  Diagnositics/Labs:  Spirometry: FEV1: 1.15L  58%, FVC: 1.35L 55%, postbronchodilator she had a 4% increase in FEV1 which is not significantly  Allergy testing: Deferred today due to reduced lung function Allergy testing results were read and interpreted by provider, documented by clinical staff.   Assessment and plan:   Asthma, mild intermittent   - At this time poorly controlled.   - Start Symbicort 160 g 2 puffs twice a day   - Continue Singulair 10 mg daily   - Start to taper off albuterol-- use every 6 hours for the next 2-3 days then every 8 hours for 2-3 days then return to as needed use.   Use 2 puffs as needed every 4-6 hours for cough, wheeze, shortness of  breath or chest tightness. Monitor frequency of use.    - will obtain CBC and IgE level and environmental profile  Asthma control goals:   Full participation in all desired activities (may need albuterol before activity)  Albuterol use two time or less a week on average (not counting use with activity)  Cough interfering with sleep two time or less a month  Oral steroids no more than once a year  No hospitalizations  Allergic rhinoconjunctivitis  - As above continue Singulair  - Continue Patanol 1 drop each eye as needed for itchy, watery, red eyes.  We'll recommend use of a re-wetting/artificial tears eye drop as well.  - continue Flonase 2 sprays each nostril for nasal congestion/drainage  - use Xyzal 5mg  daily (this replaces claritin)  -  obtain environmental allergen panel  Follow-up 3 months or sooner if needed  I appreciate the opportunity to take part in Sharon Allison's care. Please do not hesitate to contact me with questions.  Sincerely,   Prudy Feeler, MD Allergy/Immunology [facility]

## 2017-02-13 NOTE — Patient Instructions (Addendum)
Asthma   - At this time not well-controlled.   - Start Symbicort 160 g 2 puffs twice a day   - Continue Singulair 10 mg daily   - Start to taper off albuterol-- use every 6 hours for the next 2-3 days then every 8 hours for 2-3 days then return to as needed use.   Use 2 puffs as needed every 4-6 hours for cough, wheeze, shortness of breath or chest tightness. Monitor frequency of use.    - will obtain CBC and IgE level and environmental profile  Asthma control goals:   Full participation in all desired activities (may need albuterol before activity)  Albuterol use two time or less a week on average (not counting use with activity)  Cough interfering with sleep two time or less a month  Oral steroids no more than once a year  No hospitalizations  Allergic rhinoconjunctivitis  - As above continue Singulair  - Continue Patanol 1 drop each eye as needed for itchy, watery, red eyes.  We'll recommend use of a re-wetting/artificial tears eye drop as well.  - continue Flonase 2 sprays each nostril for nasal congestion/drainage  - use Xyzal 5mg  daily (this replaces claritin)  - obtain environmental allergen panel  Follow-up 3 months or sooner if needed

## 2017-02-18 LAB — CBC WITH DIFFERENTIAL/PLATELET
BASOS ABS: 0 {cells}/uL (ref 0–200)
Basophils Relative: 0 %
EOS ABS: 222 {cells}/uL (ref 15–500)
EOS PCT: 3 %
HEMATOCRIT: 39.8 % (ref 35.0–45.0)
HEMOGLOBIN: 13.4 g/dL (ref 11.7–15.5)
LYMPHS ABS: 2516 {cells}/uL (ref 850–3900)
Lymphocytes Relative: 34 %
MCH: 30.5 pg (ref 27.0–33.0)
MCHC: 33.7 g/dL (ref 32.0–36.0)
MCV: 90.7 fL (ref 80.0–100.0)
MONO ABS: 444 {cells}/uL (ref 200–950)
MPV: 9.5 fL (ref 7.5–12.5)
Monocytes Relative: 6 %
NEUTROS ABS: 4218 {cells}/uL (ref 1500–7800)
Neutrophils Relative %: 57 %
Platelets: 294 10*3/uL (ref 140–400)
RBC: 4.39 MIL/uL (ref 3.80–5.10)
RDW: 13.5 % (ref 11.0–15.0)
WBC: 7.4 10*3/uL (ref 3.8–10.8)

## 2017-02-19 LAB — CP584 ZONE 3
Allergen, Black Locust, Acacia9: <0.1 kU/L
Allergen, C. Herbarum, M2: <0.1 kU/L
Allergen, Cedar tree, t12: <0.1 kU/L
Allergen, Comm Silver Birch, t9: <0.1 kU/L
Allergen, Oak,t7: <0.1 kU/L
Allergen, P. notatum, m1: <0.1 kU/L
Allergen, S. Botryosum, m10: <0.1 kU/L
Cat Dander: <0.1 kU/L
Cockroach: <0.1 kU/L
Common Ragweed: <0.1 kU/L
Dog Dander: <0.1 kU/L
Johnson Grass: <0.1 kU/L
Pecan/Hickory Tree IgE: <0.1 kU/L
Rough Pigweed  IgE: <0.1 kU/L

## 2017-02-19 LAB — IGE: IGE (IMMUNOGLOBULIN E), SERUM: 87 kU/L (ref <115)

## 2017-04-04 ENCOUNTER — Encounter: Payer: Self-pay | Admitting: Certified Nurse Midwife

## 2017-04-04 ENCOUNTER — Ambulatory Visit (INDEPENDENT_AMBULATORY_CARE_PROVIDER_SITE_OTHER): Payer: BC Managed Care – PPO | Admitting: Certified Nurse Midwife

## 2017-04-04 ENCOUNTER — Other Ambulatory Visit: Payer: Self-pay | Admitting: Certified Nurse Midwife

## 2017-04-04 VITALS — BP 124/80 | HR 68 | Ht 62.25 in | Wt 237.0 lb

## 2017-04-04 DIAGNOSIS — Z8709 Personal history of other diseases of the respiratory system: Secondary | ICD-10-CM

## 2017-04-04 DIAGNOSIS — Z01419 Encounter for gynecological examination (general) (routine) without abnormal findings: Secondary | ICD-10-CM

## 2017-04-04 DIAGNOSIS — Z0000 Encounter for general adult medical examination without abnormal findings: Secondary | ICD-10-CM | POA: Diagnosis not present

## 2017-04-04 NOTE — Progress Notes (Signed)
61 y.o. G0P0000 Divorced  African American Fe here for annual exam.Menopausal no HRT. Denies vaginal dryness. Seen recently by [facility] to control asthma . All stable now.  Has established with PCP with [facility], but will be changing to another practice closer to her home. Also now lives close to gym and plans to enroll, has ued in the past with good success.. Needs labs here at this time. Denies edema of feet and legs or other issues today.  Has a family reunion coming up!  Patient's last menstrual period was 07/10/2001.          Sexually active: No.  The current method of family planning is status post hysterectomy.    Exercising: No.  The patient does not participate in regular exercise at present. Smoker:  no  Health Maintenance: Pap:  11/21/04, Negative History of Abnormal Pap: no MMG: 04/08/16, 3D-no, Density Category B, Bi-Rads 1:  Negative Self Breast exams: sometimes Colonoscopy: 12/2005 benign 10 years BMD: 2016, Normal TDaP: 01/20/13 Shingles: None Pneumonia: none Hep C and HIV: 03/29/16, HIV neg years ago Labs: Fasting labs today    reports that she has never smoked. She has never used smokeless tobacco. She reports that she does not drink alcohol or use drugs.  Past Medical History:  Diagnosis Date  . Anemia   . Asthma   . Fibroid   . GERD (gastroesophageal reflux disease)   . Hypertension     Past Surgical History:  Procedure Laterality Date  . ABDOMINAL HYSTERECTOMY     2002  . MYOMECTOMY     multiple fibroids    Current Outpatient Prescriptions  Medication Sig Dispense Refill  . albuterol (PROVENTIL HFA;VENTOLIN HFA) 108 (90 Base) MCG/ACT inhaler Inhale 2 puffs into the lungs every 4 (four) hours as needed for wheezing or shortness of breath. 18 g 1  . Ascorbic Acid (VITAMIN C PO) Take by mouth daily.    . B Complex Vitamins (VITAMIN-B COMPLEX) TABS Take by mouth.    . budesonide-formoterol (SYMBICORT) 160-4.5 MCG/ACT inhaler Inhale 2 puffs  into the lungs 2 (two) times daily. 1 Inhaler 5  . loratadine (CLARITIN) 5 MG chewable tablet Chew by mouth.    . Multiple Vitamins-Minerals (MULTIVITAMIN PO) Take by mouth daily.    . Probiotic Product (PROBIOTIC PO) Take by mouth daily.    . Vitamin D, Ergocalciferol, (DRISDOL) 50000 units CAPS capsule Take by mouth.    . fluticasone (FLONASE) 50 MCG/ACT nasal spray 1 spray by Each Nare route daily for 30 days.     No current facility-administered medications for this visit.     Family History  Problem Relation Age of Onset  . Hypertension Mother   . Leukemia Mother   . Lupus Father   . Hodgkin's lymphoma Maternal Grandmother   . Cancer Maternal Grandfather        prostate  . Leukemia Sister   . Kidney disease Brother   . Leukemia Brother   . Allergic rhinitis Neg Hx   . Asthma Neg Hx   . Eczema Neg Hx   . Urticaria Neg Hx     ROS:  Pertinent items are noted in HPI.  Otherwise, a comprehensive ROS was negative.  Exam:   BP 124/80 (BP Location: Right Arm, Patient Position: Sitting, Cuff Size: Large)   Pulse 68   Ht 5' 2.25" (1.581 m)   Wt 237 lb (107.5 kg)   LMP 07/10/2001   BMI 43.00 kg/m  Height: 5'  2.25" (158.1 cm) Ht Readings from Last 3 Encounters:  04/04/17 5' 2.25" (1.581 m)  02/13/17 5' 3.25" (1.607 m)  03/29/16 5' 2.25" (1.581 m)    General appearance: alert, cooperative and appears stated age Head: Normocephalic, without obvious abnormality, atraumatic Neck: no adenopathy, supple, symmetrical, trachea midline and thyroid normal to inspection and palpation Lungs: clear to auscultation bilaterally Breasts: normal appearance, no masses or tenderness, No nipple retraction or dimpling, No nipple discharge or bleeding, No axillary or supraclavicular adenopathy Heart: regular rate and rhythm Abdomen: soft, non-tender; no masses,  no organomegaly Extremities: extremities normal, atraumatic, no cyanosis or edema Skin: Skin color, texture, turgor normal. No rashes  or lesions Lymph nodes: Cervical, supraclavicular, and axillary nodes normal. No abnormal inguinal nodes palpated Neurologic: Grossly normal   Pelvic: External genitalia:  no lesions              Urethra:  normal appearing urethra with no masses, tenderness or lesions              Bartholin's and Skene's: normal                 Vagina: normal appearing vagina with normal color and discharge, no lesions              Cervix: absent              Pap taken: No. Bimanual Exam:  Uterus:  uterus absent              Adnexa: normal adnexa and no mass, fullness, tenderness               Rectovaginal: Confirms               Anus:  normal sphincter tone, no lesions  Chaperone present: yes  A:  Well Woman with normal exam  Menopausal no HRT, S/P TAH with ovaries retained   Asthma management with asthma center now  Morbid obesity  Colonoscopy due patient will call has reminder  Mammogram due has appointment  Screening labs  P:   Reviewed health and wellness pertinent to exam  Aware to advise if vaginal dryness or bleeding  Follow up with Asthma clinic as indicated  Plans weight loss and gym use, encouraged to start enroll in the next week, to keep her momentum up.  Stressed importance of colon cancer screening.  Keep appointment as idicated  Pap smear: no  Labs; CBC, CMP,Lipid panel, TSH, Vitamin D   counseled on breast self exam, mammography screening, adequate intake of calcium and vitamin D, diet and exercise  return annually or prn  An After Visit Summary was printed and given to the patient.

## 2017-04-04 NOTE — Patient Instructions (Signed)

## 2017-04-05 LAB — CBC
HEMOGLOBIN: 13.1 g/dL (ref 11.1–15.9)
Hematocrit: 38.5 % (ref 34.0–46.6)
MCH: 30.8 pg (ref 26.6–33.0)
MCHC: 34 g/dL (ref 31.5–35.7)
MCV: 91 fL (ref 79–97)
Platelets: 328 10*3/uL (ref 150–379)
RBC: 4.25 x10E6/uL (ref 3.77–5.28)
RDW: 13.8 % (ref 12.3–15.4)
WBC: 7 10*3/uL (ref 3.4–10.8)

## 2017-04-05 LAB — COMPREHENSIVE METABOLIC PANEL
ALT: 20 IU/L (ref 0–32)
AST: 22 IU/L (ref 0–40)
Albumin/Globulin Ratio: 1.8 (ref 1.2–2.2)
Albumin: 4.3 g/dL (ref 3.6–4.8)
Alkaline Phosphatase: 83 IU/L (ref 39–117)
BILIRUBIN TOTAL: 0.6 mg/dL (ref 0.0–1.2)
BUN/Creatinine Ratio: 23 (ref 12–28)
BUN: 13 mg/dL (ref 8–27)
CALCIUM: 10.5 mg/dL — AB (ref 8.7–10.3)
CO2: 22 mmol/L (ref 20–29)
CREATININE: 0.57 mg/dL (ref 0.57–1.00)
Chloride: 100 mmol/L (ref 96–106)
GFR calc non Af Amer: 101 mL/min/{1.73_m2} (ref >59)
GFR, EST AFRICAN AMERICAN: 117 mL/min/{1.73_m2} (ref >59)
GLUCOSE: 90 mg/dL (ref 65–99)
Globulin, Total: 2.4 g/dL (ref 1.5–4.5)
Potassium: 4.2 mmol/L (ref 3.5–5.2)
Sodium: 138 mmol/L (ref 134–144)
TOTAL PROTEIN: 6.7 g/dL (ref 6.0–8.5)

## 2017-04-05 LAB — LIPID PANEL
Chol/HDL Ratio: 3.9 ratio (ref 0.0–4.4)
Cholesterol, Total: 197 mg/dL (ref 100–199)
HDL: 51 mg/dL (ref >39)
LDL CALC: 119 mg/dL — AB (ref 0–99)
Triglycerides: 133 mg/dL (ref 0–149)
VLDL CHOLESTEROL CAL: 27 mg/dL (ref 5–40)

## 2017-04-05 LAB — VITAMIN D 25 HYDROXY (VIT D DEFICIENCY, FRACTURES): Vit D, 25-Hydroxy: 23.8 ng/mL — ABNORMAL LOW (ref 30.0–100.0)

## 2017-04-05 LAB — TSH: TSH: 1.37 u[IU]/mL (ref 0.450–4.500)

## 2017-04-06 ENCOUNTER — Other Ambulatory Visit: Payer: Self-pay | Admitting: Certified Nurse Midwife

## 2017-04-06 DIAGNOSIS — R899 Unspecified abnormal finding in specimens from other organs, systems and tissues: Secondary | ICD-10-CM

## 2017-04-14 ENCOUNTER — Telehealth: Payer: Self-pay | Admitting: *Deleted

## 2017-04-14 NOTE — Telephone Encounter (Signed)
I have attempted to contact this patient by phone with the following results: left message to return call to [place] at [phone] on answering machine (mobile per [facility]).  No personal information given. [phone] (Mobile)

## 2017-04-14 NOTE — Telephone Encounter (Signed)
Pt notified in result note.  Closing encounter. 

## 2017-04-14 NOTE — Telephone Encounter (Signed)
-----   Message from Regina Eck, CNM sent at 04/06/2017  7:39 PM EDT ----- Notify patient that her liver, kidney and glucose profile is normal with the exception of calcium will need to recheck in 2 weeks, order placed, please schedule CBC is normal Lipid panel is normal with slight elevation of LDL. Work on decrease fat and increase sugar in diet an regular exercise TSH is normal  Vitamin D is low needs to start on Vitamin D 3 2000 IU daily for 8 weeks and recheck, order placed, please schedule

## 2017-04-21 ENCOUNTER — Other Ambulatory Visit: Payer: Self-pay | Admitting: Certified Nurse Midwife

## 2017-04-21 DIAGNOSIS — Z1231 Encounter for screening mammogram for malignant neoplasm of breast: Secondary | ICD-10-CM

## 2017-04-22 ENCOUNTER — Other Ambulatory Visit: Payer: Self-pay

## 2017-04-24 ENCOUNTER — Other Ambulatory Visit (INDEPENDENT_AMBULATORY_CARE_PROVIDER_SITE_OTHER): Payer: BC Managed Care – PPO

## 2017-04-24 DIAGNOSIS — R899 Unspecified abnormal finding in specimens from other organs, systems and tissues: Secondary | ICD-10-CM

## 2017-04-25 ENCOUNTER — Ambulatory Visit
Admission: RE | Admit: 2017-04-25 | Discharge: 2017-04-25 | Disposition: A | Discharge status: Home / Self Care | Payer: BC Managed Care – PPO | Source: Ambulatory Visit | Attending: Certified Nurse Midwife | Admitting: Certified Nurse Midwife

## 2017-04-25 DIAGNOSIS — Z1231 Encounter for screening mammogram for malignant neoplasm of breast: Secondary | ICD-10-CM

## 2017-04-25 LAB — PTH, INTACT AND CALCIUM
Calcium: 10.4 mg/dL — ABNORMAL HIGH (ref 8.7–10.3)
PTH: 77 pg/mL — AB (ref 15–65)

## 2017-05-01 ENCOUNTER — Telehealth: Payer: Self-pay | Admitting: Allergy

## 2017-05-01 NOTE — Telephone Encounter (Signed)
I have only addressed her asthma and allergies at her initial visit.  Thus have not evaluated the need for a sleep study.  If she would like to have this done before a follow-up visit I suggest she discuss this with her PCP in regards to ordering a sleep study.  I would have no diagnosis/code at this point to order this.

## 2017-05-01 NOTE — Telephone Encounter (Signed)
Pt came by to see if she could get [place] to send her for another sleep study. Her appointment not until sept 6 with [place]. And she was seening want to do. [phone].

## 2017-05-01 NOTE — Telephone Encounter (Signed)
I spoke with patient and she is willing to wait to discuss this at her follow up with Dr. Nelva Bush.

## 2017-05-02 ENCOUNTER — Other Ambulatory Visit: Payer: Self-pay | Admitting: Certified Nurse Midwife

## 2017-05-02 DIAGNOSIS — R899 Unspecified abnormal finding in specimens from other organs, systems and tissues: Secondary | ICD-10-CM

## 2017-05-04 ENCOUNTER — Other Ambulatory Visit: Payer: Self-pay | Admitting: Certified Nurse Midwife

## 2017-05-04 DIAGNOSIS — R899 Unspecified abnormal finding in specimens from other organs, systems and tissues: Secondary | ICD-10-CM

## 2017-05-05 ENCOUNTER — Telehealth: Payer: Self-pay | Admitting: *Deleted

## 2017-05-05 NOTE — Telephone Encounter (Signed)
-----   Message from Regina Eck, CNM sent at 05/04/2017  3:10 PM EDT ----- Needs 24 hour urine collection. See previous note and repeat calcium serum level. Order placed See previous note

## 2017-05-05 NOTE — Telephone Encounter (Signed)
Left message to call regarding lab results -eh 

## 2017-05-07 NOTE — Telephone Encounter (Signed)
Patient returning your call.

## 2017-05-07 NOTE — Telephone Encounter (Signed)
Spoke patient and informed of results and recommendations. Patient is going to come by the office to pick up 24 hour urine tomorrow-eh

## 2017-05-14 ENCOUNTER — Other Ambulatory Visit (INDEPENDENT_AMBULATORY_CARE_PROVIDER_SITE_OTHER): Payer: BC Managed Care – PPO

## 2017-05-14 DIAGNOSIS — R899 Unspecified abnormal finding in specimens from other organs, systems and tissues: Secondary | ICD-10-CM | POA: Diagnosis not present

## 2017-05-15 ENCOUNTER — Encounter: Payer: Self-pay | Admitting: Allergy

## 2017-05-15 ENCOUNTER — Telehealth: Payer: Self-pay

## 2017-05-15 ENCOUNTER — Ambulatory Visit (INDEPENDENT_AMBULATORY_CARE_PROVIDER_SITE_OTHER): Payer: BC Managed Care – PPO | Admitting: Allergy

## 2017-05-15 VITALS — BP 136/80 | HR 69 | Temp 98.3°F | Resp 18

## 2017-05-15 DIAGNOSIS — J4520 Mild intermittent asthma, uncomplicated: Secondary | ICD-10-CM

## 2017-05-15 DIAGNOSIS — J309 Allergic rhinitis, unspecified: Secondary | ICD-10-CM

## 2017-05-15 DIAGNOSIS — H1010 Acute atopic conjunctivitis, unspecified eye: Secondary | ICD-10-CM

## 2017-05-15 DIAGNOSIS — G4733 Obstructive sleep apnea (adult) (pediatric): Secondary | ICD-10-CM | POA: Diagnosis not present

## 2017-05-15 LAB — CALCIUM, URINE, 24 HOUR
Calcium, 24H Urine: 215.6 mg/24 hr (ref 100.0–300.0)
Calcium, Urine: 15.4 mg/dL

## 2017-05-15 LAB — CALCIUM: CALCIUM: 10.6 mg/dL — AB (ref 8.7–10.3)

## 2017-05-15 MED ORDER — MONTELUKAST SODIUM 10 MG PO TABS: 10.0000 mg | ORAL_TABLET | Freq: Every day | ORAL | 5 refills | Status: DC

## 2017-05-15 NOTE — Progress Notes (Signed)
Follow-up Note  RE: Sharon Allison MRN: [id number] DOB: [birth year] Date of Office Visit: 05/15/2017   History of present illness: Sharon Allison is a 61 y.o. female presenting today for follow-up of asthma, allergic rhinoconjunctivitis.  She was last seen in the office on 02/13/17 by myself for initial evaluation.  At this visit she was having signficant symptoms related to her asthma.  I started her on symbicort and she states she has had good improvement in her symptoms.  She states her cough is much less now and she uses albuterol very infrequently.  She is taking symbicort 2 puffs twice a day.  She states she did not use albuterol at all in August.  She denies any nighttime awakenings related to her asthma.    She does have a history of OSA and use to use a CPAP at night but states it has been years since she used CPAP and feels like she needs to get back on it.  She states she has recently been seen by her eye doctor and GI doctor who both mentioned she should be reevaluated for OSA and need for CPAP.  She states she does have a lot of daytime sleepiness.  She has had weight gain as well.  She states she has been told she continues to have pauses of breathing when sleeping.    With her allergy symptoms she reports decreased symptoms with use of Xyzal and flonase.  She was on singulair however this was never filled by pharmacy after last visit.   She states she has dry eyes and her eye doctor recommend she use Systance, artificial tears and Zaditor.     Review of systems: Review of Systems  Constitutional: Positive for malaise/fatigue. Negative for chills, fever and weight loss.  HENT: Positive for congestion. Negative for ear discharge, ear pain, nosebleeds, sinus pain and sore throat.   Eyes: Negative for discharge and redness.  Respiratory: Negative for cough, shortness of breath and wheezing.   Cardiovascular: Negative for chest pain.  Gastrointestinal: Negative for abdominal pain,  constipation, diarrhea, heartburn, nausea and vomiting.  Musculoskeletal: Negative for joint pain.  Skin: Negative for itching and rash.  Neurological: Negative for headaches.    All other systems negative unless noted above in HPI  Past medical/social/surgical/family history have been reviewed and are unchanged unless specifically indicated below.  No changes  Medication List: Allergies as of 05/15/2017      Reactions   Iodine Swelling   Sulfa Antibiotics Itching      Medication List       Accurate as of 05/15/17  5:58 PM. Always use your most recent med list.          albuterol 108 (90 Base) MCG/ACT inhaler Commonly known as:  PROVENTIL HFA;VENTOLIN HFA Inhale 2 puffs into the lungs every 4 (four) hours as needed for wheezing or shortness of breath.   budesonide-formoterol 160-4.5 MCG/ACT inhaler Commonly known as:  SYMBICORT Inhale 2 puffs into the lungs 2 (two) times daily.   EQ RESTORE PM Oint Place 1 drop into both eyes daily.   fluticasone 50 MCG/ACT nasal spray Commonly known as:  FLONASE 1 spray by Each Nare route daily for 30 days.   ketotifen 0.025 % ophthalmic solution Commonly known as:  ZADITOR Place 1 drop into both eyes 2 (two) times daily.   loratadine 5 MG chewable tablet Commonly known as:  CLARITIN Chew by mouth.   MULTIVITAMIN PO Take by mouth daily.  PROBIOTIC PO Take by mouth daily.   SYSTANE BALANCE 0.6 % Soln Generic drug:  Propylene Glycol Place 1 drop into both eyes 3 (three) times daily.   VITAMIN C PO Take by mouth daily.   Vitamin D (Ergocalciferol) 50000 units Caps capsule Commonly known as:  DRISDOL Take by mouth.   Vitamin-B Complex Tabs Take by mouth.            Discharge Care Instructions        Start     Ordered   05/15/17 0000  Spirometry with Graph    Question Answer Comment  Where should this test be performed? Other   Basic spirometry Yes      05/15/17 1703   05/15/17 0000  Nocturnal  polysomnography    Comments:  History of sleep apnea with previous c-pap use and continues with daytime sleepiness.  Question:  Where should this test be performed:  Answer:  Other   05/15/17 1703      Known medication allergies: Allergies  Allergen Reactions  . Iodine Swelling  . Sulfa Antibiotics Itching     Physical examination: Blood pressure 136/80, pulse 69, temperature 98.3 F (36.8 C), temperature source Oral, resp. rate 18, last menstrual period 07/10/2001, SpO2 94 %.  General: Alert, interactive, in no acute distress, overweight. HEENT: PERRLA, TMs pearly gray, turbinates mildly edematous without discharge, post-pharynx non erythematous. Neck: Supple without lymphadenopathy. Lungs: Clear to auscultation without wheezing, rhonchi or rales. {no increased work of breathing. CV: Normal S1, S2 without murmurs. Abdomen: Nondistended, nontender. Skin: Warm and dry, without lesions or rashes. Extremities:  No clubbing, cyanosis or edema. Neuro:   Grossly intact.  Diagnositics/Labs: Labs:  Component     Latest Ref Rng & Units 02/18/2017  Allergen, D pternoyssinus,d7     kU/L <0.10  D. farinae     kU/L <0.10  Cat Dander     kU/L <0.10  Dog Dander     kU/L <0.10  [place] Grass     kU/L <0.10  Meadow Grass     kU/L <0.10  [place] Grass     kU/L <0.10  [place]ia Grass     kU/L <0.10  Cockroach     kU/L <0.10  Allergen, P. notatum, m1     kU/L <0.10  Allergen, C. Herbarum, M2     kU/L <0.10  Aspergillus fumigatus, m3     kU/L <0.10  Allergen, Mucor Racemosus, M4     kU/L <0.10  Allergen, A. alternata, m6     kU/L <0.10  Allergen, S. Botryosum, m10     kU/L <0.10  Box Elder IgE     kU/L <0.10  Allergen, Comm Silver Wendee Copp, t9     kU/L <0.10  Allergen, Cedar tree, t12     kU/L <0.10  Allergen, Oak,t7     kU/L <0.10  Elm IgE     kU/L <0.10  Pecan/Hickory Tree IgE     kU/L <0.10  Allergen, Mulberry, t76     kU/L <0.10  Common Ragweed     kU/L <0.10    Plantain     kU/L <0.10  Rough Pigweed  IgE     kU/L <0.10  Nettle     kU/L <0.10  Allergen, Black Locust, Acacia9     kU/L <0.10   Component     Latest Ref Rng & Units 02/18/2017  WBC     3.8 - 10.8 K/uL 7.4  RBC     3.80 - 5.10 MIL/uL  4.39  Hemoglobin     11.7 - 15.5 g/dL 13.4  HCT     35.0 - 45.0 % 39.8  MCV     80.0 - 100.0 fL 90.7  MCH     27.0 - 33.0 pg 30.5  MCHC     32.0 - 36.0 g/dL 33.7  RDW     11.0 - 15.0 % 13.5  Platelets     140 - 400 K/uL 294  MPV     7.5 - 12.5 fL 9.5  NEUT#     1,500 - 7,800 cells/uL 4,218  Lymphocyte #     850 - 3,900 cells/uL 2,516  Monocyte #     200 - 950 cells/uL 444  Eosinophils Absolute     15 - 500 cells/uL 222  Basophils Absolute     0 - 200 cells/uL 0  Neutrophils     % 57  Lymphocytes     % 34  Monocytes Relative     % 6  Eosinophil     % 3  Basophil     % 0  Smear Review      Criteria for review not met  IgE (Immunoglobulin E), Serum     <115 kU/L 87   Spirometry: FEV1: 1.61L  81%, FVC: 1.81L  74%, ratio consistent with nonobstructive pattern  Assessment and plan:   Asthma, mild intermittent   - control is improved   - continue Symbicort 160 g 2 puffs twice a day   - resume Singulair 10 mg daily   - have access to albuterol inhaler 2 puffs every 4-6 hours as needed for cough/wheeze/shortness of breath/chest tightness.  May use 15-20 minutes prior to activity.   Monitor frequency of use.    Asthma control goals:   Full participation in all desired activities (may need albuterol before activity)  Albuterol use two time or less a week on average (not counting use with activity)  Cough interfering with sleep two time or less a month  Oral steroids no more than once a year  No hospitalizations  Allergic rhinoconjunctivitis  - As above continue Singulair  - Continue Zaditor 1 drop each eye as needed for itchy, watery, red eyes.    Use your other eye drops for dry eye as recommended by your eye  doctor  - continue Flonase 2 sprays each nostril for nasal congestion/drainage  - continue Xyzal 68m daily   Obstructive sleep apnea  - will order sleep study for evaluation of need for CPAP   Follow-up 4-6 months or sooner if needed I appreciate the opportunity to take part in Jaedynn's care. Please do not hesitate to contact me with questions.  Sincerely,   SPrudy Feeler MD [facility]

## 2017-05-15 NOTE — Telephone Encounter (Signed)
I called [facility] to ask how they wanted [place] to send the order. They informed me that we can fax the order along with office notes ,demographics, and insurance information to [phone].

## 2017-05-15 NOTE — Patient Instructions (Addendum)
Asthma, mild intermittent   - control is improved   - continue Symbicort 160 g 2 puffs twice a day   - resume Singulair 10 mg daily   - have access to albuterol inhaler 2 puffs every 4-6 hours as needed for cough/wheeze/shortness of breath/chest tightness.  May use 15-20 minutes prior to activity.   Monitor frequency of use.    Asthma control goals:   Full participation in all desired activities (may need albuterol before activity)  Albuterol use two time or less a week on average (not counting use with activity)  Cough interfering with sleep two time or less a month  Oral steroids no more than once a year  No hospitalizations  Allergic rhinoconjunctivitis  - As above continue Singulair  - Continue Zaditor 1 drop each eye as needed for itchy, watery, red eyes.    Use your other eye drops for dry eye as recommended by your eye doctor  - continue Flonase 2 sprays each nostril for nasal congestion/drainage  - continue Xyzal 5mg  daily   Obstructive sleep apnea  - will order sleep study for evaluation of need for CPAP   Follow-up 4-6 months or sooner if needed

## 2017-05-20 ENCOUNTER — Other Ambulatory Visit: Payer: Self-pay | Admitting: Allergy

## 2017-05-20 NOTE — Telephone Encounter (Signed)
Patient needs a refill on sigulair - states she has called before and nothing has been sent in for her. Also patient wants to know the status of her sleep study

## 2017-05-20 NOTE — Telephone Encounter (Signed)
Patient called back and said the Singulair was sent to the wrong pharmacy. It was suppose to be sent to [facility] on [address].

## 2017-05-20 NOTE — Telephone Encounter (Signed)
Called patient. Left message in regards to the montelukast already being sent on 05/16/2017. I also informed her as soon as we get the results we would call her.To give [place] a call if she has any other concerns.

## 2017-05-21 ENCOUNTER — Telehealth: Payer: Self-pay | Admitting: *Deleted

## 2017-05-21 DIAGNOSIS — E213 Hyperparathyroidism, unspecified: Secondary | ICD-10-CM

## 2017-05-21 MED ORDER — MONTELUKAST SODIUM 10 MG PO TABS: 10.0000 mg | ORAL_TABLET | Freq: Every day | ORAL | 5 refills | Status: DC

## 2017-05-21 NOTE — Telephone Encounter (Signed)
Patient informed. 

## 2017-05-21 NOTE — Telephone Encounter (Signed)
-----   Message from Regina Eck, CNM sent at 05/20/2017 10:05 PM EDT ----- Notify patient that her calcium was again elevated and her urine calcium was upper limits of normal. Her PTH was elevated also previously. Will need referral to Dr. 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

## 2017-05-21 NOTE — Telephone Encounter (Signed)
Spoke with patient, advised of results and recommendations as seen below per Melvia Heaps, CNM. Patient accepts referral to Dr. Harlow Asa. Advised patient our referral coordinator will f/u with scheduling, may receive call directly from [facility] for scheduling. Patient verbalizes understanding and is agreeable.   Order placed for referral.  Patient is agreeable to disposition. Will close encounter.   Cc: Magdalene Patricia; [address]

## 2017-05-21 NOTE — Telephone Encounter (Signed)
I re-sent script to the correct Pharmacy.

## 2017-06-09 ENCOUNTER — Other Ambulatory Visit: Payer: BC Managed Care – PPO

## 2017-06-09 DIAGNOSIS — R899 Unspecified abnormal finding in specimens from other organs, systems and tissues: Secondary | ICD-10-CM

## 2017-06-10 ENCOUNTER — Other Ambulatory Visit: Payer: Self-pay | Admitting: Certified Nurse Midwife

## 2017-06-10 DIAGNOSIS — E559 Vitamin D deficiency, unspecified: Secondary | ICD-10-CM

## 2017-06-10 LAB — VITAMIN D 25 HYDROXY (VIT D DEFICIENCY, FRACTURES): VIT D 25 HYDROXY: 29.7 ng/mL — AB (ref 30.0–100.0)

## 2017-07-03 ENCOUNTER — Other Ambulatory Visit (HOSPITAL_COMMUNITY): Payer: Self-pay | Admitting: Surgery

## 2017-07-03 DIAGNOSIS — E210 Primary hyperparathyroidism: Secondary | ICD-10-CM

## 2017-07-16 ENCOUNTER — Encounter (HOSPITAL_COMMUNITY)
Admission: RE | Admit: 2017-07-16 | Discharge: 2017-07-16 | Disposition: A | Discharge status: Home / Self Care | Payer: BC Managed Care – PPO | Source: Ambulatory Visit | Attending: Surgery | Admitting: Surgery

## 2017-07-16 DIAGNOSIS — E210 Primary hyperparathyroidism: Secondary | ICD-10-CM | POA: Insufficient documentation

## 2017-07-16 MED ORDER — TECHNETIUM TC 99M SESTAMIBI GENERIC - CARDIOLITE
25.0000 | Freq: Once | INTRAVENOUS | Status: AC | PRN
  Administered 2017-07-16: 25 via INTRAVENOUS

## 2017-07-21 ENCOUNTER — Other Ambulatory Visit: Payer: Self-pay | Admitting: Surgery

## 2017-07-21 DIAGNOSIS — E210 Primary hyperparathyroidism: Secondary | ICD-10-CM

## 2017-07-23 ENCOUNTER — Ambulatory Visit
Admission: RE | Admit: 2017-07-23 | Discharge: 2017-07-23 | Disposition: A | Discharge status: Home / Self Care | Payer: BC Managed Care – PPO | Source: Ambulatory Visit | Attending: Surgery | Admitting: Surgery

## 2017-07-23 DIAGNOSIS — E210 Primary hyperparathyroidism: Secondary | ICD-10-CM

## 2017-07-29 ENCOUNTER — Other Ambulatory Visit: Payer: Self-pay | Admitting: Nurse Practitioner

## 2017-07-29 ENCOUNTER — Other Ambulatory Visit: Payer: Self-pay | Admitting: Surgery

## 2017-07-29 DIAGNOSIS — D126 Benign neoplasm of colon, unspecified: Secondary | ICD-10-CM

## 2017-08-13 ENCOUNTER — Other Ambulatory Visit: Payer: Self-pay

## 2017-08-13 ENCOUNTER — Other Ambulatory Visit (INDEPENDENT_AMBULATORY_CARE_PROVIDER_SITE_OTHER): Payer: BC Managed Care – PPO

## 2017-08-13 DIAGNOSIS — E559 Vitamin D deficiency, unspecified: Secondary | ICD-10-CM

## 2017-08-14 LAB — VITAMIN D 25 HYDROXY (VIT D DEFICIENCY, FRACTURES): VIT D 25 HYDROXY: 34.8 ng/mL (ref 30.0–100.0)

## 2017-08-19 ENCOUNTER — Ambulatory Visit: Payer: Self-pay | Admitting: Surgery

## 2017-08-25 ENCOUNTER — Other Ambulatory Visit: Payer: Self-pay | Admitting: Surgery

## 2017-08-25 DIAGNOSIS — E210 Primary hyperparathyroidism: Secondary | ICD-10-CM

## 2017-09-10 ENCOUNTER — Ambulatory Visit: Payer: BC Managed Care – PPO | Admitting: Allergy

## 2017-09-10 ENCOUNTER — Encounter: Payer: Self-pay | Admitting: Allergy

## 2017-09-10 VITALS — BP 126/78 | HR 76 | Ht 63.0 in | Wt 237.0 lb

## 2017-09-10 DIAGNOSIS — H1010 Acute atopic conjunctivitis, unspecified eye: Secondary | ICD-10-CM | POA: Diagnosis not present

## 2017-09-10 DIAGNOSIS — J4520 Mild intermittent asthma, uncomplicated: Secondary | ICD-10-CM

## 2017-09-10 DIAGNOSIS — G4733 Obstructive sleep apnea (adult) (pediatric): Secondary | ICD-10-CM | POA: Diagnosis not present

## 2017-09-10 DIAGNOSIS — J309 Allergic rhinitis, unspecified: Secondary | ICD-10-CM | POA: Diagnosis not present

## 2017-09-10 NOTE — Progress Notes (Signed)
Follow-up Note  RE: CAILA CIRELLI MRN: [id number] DOB: [birth year] Date of Office Visit: 09/10/2017   History of present illness: Sharon Allison is a 62 y.o. female presenting today for follow-up of asthma, allergic rhinoconjunctivitis and OSA. She was last seen in the office on 05/15/17 by myself.   After this visit I advised she resume Singulair and continue on symbicort 122mcg 2 puffs twice day of which she has been doing.  She states her breathing is much better now and she states she has not needed to use any albuterol since last visit.  She denies any nighttime awakenings, ED/UC visits or oral steroids.  She does currently endorse nasal congestion and drainage that started several days ago. She is also taking xyzal daily.  She denies any cold/flu symptoms including no fevers, myalgias, N/V/D.  No known sick contacts.   She did have her sleep study done which was done by [employer] in September as a home study.  She states she has not heard the results and has not been set up with CPAP.   She initially wanted to use this company as this was the company she used several years ago who set up her CPAP before.  However she stopped using CPAP as she felt better and thought she was 'cured'.  She stated at last visit in order to get back on CPAP she needed a new sleep study and the company set everything up for her depending on the sleep study results.  She would like the results of this study if possible. She will be having surgery on 09/18/16 for colon polyp removal that was unable to be removed on routine colonoscopy.    Review of systems: Review of Systems  Constitutional: Positive for malaise/fatigue. Negative for chills and fever.  HENT: Positive for congestion. Negative for ear discharge, ear pain, nosebleeds, sinus pain and sore throat.   Eyes: Negative for pain, discharge and redness.  Respiratory: Negative for cough, sputum production, shortness of breath and wheezing.     Cardiovascular: Negative for chest pain.  Gastrointestinal: Negative for abdominal pain, constipation, diarrhea, heartburn, nausea and vomiting.  Musculoskeletal: Negative for joint pain and myalgias.  Skin: Negative for itching and rash.  Neurological: Negative for headaches.    All other systems negative unless noted above in HPI  Past medical/social/surgical/family history have been reviewed and are unchanged unless specifically indicated below.  No changes  Medication List: Allergies as of 09/10/2017      Reactions   Iodine Swelling   Sulfa Antibiotics Itching      Medication List        Accurate as of 09/10/17  5:01 PM. Always use your most recent med list.          albuterol 108 (90 Base) MCG/ACT inhaler Commonly known as:  PROVENTIL HFA;VENTOLIN HFA Inhale 2 puffs into the lungs every 4 (four) hours as needed for wheezing or shortness of breath.   budesonide-formoterol 160-4.5 MCG/ACT inhaler Commonly known as:  SYMBICORT Inhale 2 puffs into the lungs 2 (two) times daily.   CENTRUM SILVER PO Take 1 tablet by mouth daily.   ketotifen 0.025 % ophthalmic solution Commonly known as:  ZADITOR Place 1 drop into both eyes 2 (two) times daily.   lansoprazole 15 MG capsule Commonly known as:  PREVACID Take 15 mg by mouth daily.   montelukast 10 MG tablet Commonly known as:  SINGULAIR Take 1 tablet (10 mg total) by mouth at bedtime.  SYSTANE OP Place 1 drop into both eyes 2 (two) times daily.   TURMERIC PO Take 1 capsule by mouth daily.   vitamin B-12 250 MCG tablet Commonly known as:  CYANOCOBALAMIN Take 250 mcg by mouth daily.   vitamin C 1000 MG tablet Take 1,000 mg by mouth daily.   Vitamin D3 1000 units Caps Take 1,000 Units by mouth daily.       Known medication allergies: Allergies  Allergen Reactions  . Iodine Swelling  . Sulfa Antibiotics Itching     Physical examination: Blood pressure 126/78, pulse 76, height 5\' 3"  (1.6 m), weight  237 lb (107.5 kg), last menstrual period 07/10/2001, SpO2 96 %.  General: Alert, interactive, in no acute distress. HEENT: PERRLA, TMs pearly gray, turbinates markedly edematous with near obstruction bilaterally without discharge, post-pharynx non erythematous. Neck: Supple without lymphadenopathy. Lungs: Clear to auscultation without wheezing, rhonchi or rales. {no increased work of breathing. CV: Normal S1, S2 without murmurs. Abdomen: Nondistended, nontender. Skin: Warm and dry, without lesions or rashes. Extremities:  No clubbing, cyanosis or edema. Neuro:   Grossly intact.  Diagnositics/Labs:  Spirometry: FEV1: 1.64L  83%, FVC: 2.06L  82%, ratio consistent with nonobstructive pattern  ACT 22  Home sleep study scanned into system which showed per interpretation severe OSA with 67 apneas with saturations <89% with nadir of 67%. Recommendations CPAP  Assessment and plan: Patient Instructions  Asthma, mild intermittent   - control continues to be improved   - continue Symbicort 160 g 2 puffs twice a day   - continue Singulair 10 mg daily   - have access to albuterol inhaler 2 puffs every 4-6 hours as needed for cough/wheeze/shortness of breath/chest tightness.  May use 15-20 minutes prior to activity.   Monitor frequency of use.    Asthma control goals:   Full participation in all desired activities (may need albuterol before activity)  Albuterol use two time or less a week on average (not counting use with activity)  Cough interfering with sleep two time or less a month  Oral steroids no more than once a year  No hospitalizations  Allergic rhinoconjunctivitis  - As above continue Singulair  - Continue Zaditor 1 drop each eye as needed for itchy, watery, red eyes.    Use your other eye drops for dry eye as recommended by your eye doctor - at this time nose with severe obstruction.  Advise use of Afrin (OTC nasal decongestant) use 2 sprays each nostril daily for next 3  days.  Do not use more than 5 days at a time.  Afrin helps to open up your nose so your routine nasal sprays can reach part of the nose they need to reach to be effective.   Once you use Afrin wait about 5-10 minutes or until you can breathe more freely then use your nasal sprays.   Provided with Dymista sample today.  This is a combination nasal spray with Flonase and Astelin, a nasal antihistamine.  This helps both nasal congestion and nasal drainage.  While using Dymista hold your Flonase.    - Flonase 2 sprays each nostril for nasal congestion/drainage  - continue Xyzal 5mg  daily   Obstructive sleep apnea  - sleep study shows severe sleep apnea and CPAP is the treatment of choice.  We called the sleepy study company physician who reports he is no longer practicing and he recommended referral to sleep medicine.  We will place referral for sleep medicine for CPAP initiation.  We also provided her with a copy of her sleep study results.    Follow-up 6 months or sooner if needed  I appreciate the opportunity to take part in Maralyn's care. Please do not hesitate to contact me with questions.  Sincerely,   Prudy Feeler, MD Allergy/Immunology [facility]

## 2017-09-10 NOTE — Patient Instructions (Addendum)
Asthma, mild intermittent   - control continues to be improved   - continue Symbicort 160 g 2 puffs twice a day   - continue Singulair 10 mg daily   - have access to albuterol inhaler 2 puffs every 4-6 hours as needed for cough/wheeze/shortness of breath/chest tightness.  May use 15-20 minutes prior to activity.   Monitor frequency of use.    Asthma control goals:   Full participation in all desired activities (may need albuterol before activity)  Albuterol use two time or less a week on average (not counting use with activity)  Cough interfering with sleep two time or less a month  Oral steroids no more than once a year  No hospitalizations  Allergic rhinoconjunctivitis  - As above continue Singulair  - Continue Zaditor 1 drop each eye as needed for itchy, watery, red eyes.    Use your other eye drops for dry eye as recommended by your eye doctor - at this time nose with severe obstruction.  Advise use of Afrin (OTC nasal decongestant) use 2 sprays each nostril daily for next 3 days.  Do not use more than 5 days at a time.  Afrin helps to open up your nose so your routine nasal sprays can reach part of the nose they need to reach to be effective.   Once you use Afrin wait about 5-10 minutes or until you can breathe more freely then use your nasal sprays.   Provided with Dymista sample today.  This is a combination nasal spray with Flonase and Astelin, a nasal antihistamine.  This helps both nasal congestion and nasal drainage.  While using Dymista hold your Flonase.    - Flonase 2 sprays each nostril for nasal congestion/drainage  - continue Xyzal 5mg  daily   Obstructive sleep apnea  - sleep study shows severe sleep apnea and CPAP is the treatment of choice.  We will try calling your sleep study center to facilitate you getting restarted on CPAP.     Follow-up 6 months or sooner if needed

## 2017-09-11 NOTE — Patient Instructions (Signed)
KIMBERLLY NORGARD  09/11/2017   Your procedure is scheduled on: 09-18-17  Report to [facility] Main  Entrance    Report to admitting at [facility]   Call this number if you have problems the morning of surgery [phone]   Remember: ONLY 1 PERSON MAY GO WITH YOU TO SHORT STAY TO GET  READY MORNING OF YOUR SURGERY.     Do not eat food :After Midnight on Tuesday 09-16-17. Drink plenty of clear liquids on Wednesday 09-17-17 and follow all bowel prep orders provided by your surgeon. Continue clear liquids until 5am day of surgery. Please finish carbohydrate drink per surgeon order.     Take these medicines the morning of surgery with A SIP OF WATER: prevacid, inhalers if needed, eye drops                                 You may not have any metal on your body including hair pins and              piercings  Do not wear jewelry, make-up, lotions, powders or perfumes, deodorant             Do not wear nail polish.  Do not shave  48 hours prior to surgery.        Do not bring valuables to the hospital. [place].  Contacts, dentures or bridgework may not be worn into surgery.  Leave suitcase in the car. After surgery it may be brought to your room.                 Please read over the following fact sheets you were given: _____________________________________________________________________    CLEAR LIQUID DIET   Foods Allowed                                                                     Foods Excluded  Coffee and tea, regular and decaf                             liquids that you cannot  Plain Jell-O in any flavor                                             see through such as: Fruit ices (not with fruit pulp)                                     milk, soups, orange juice  Iced Popsicles                                    All solid food Carbonated beverages, regular and diet  Cranberry, grape and apple juices Sports drinks like Gatorade Lightly seasoned clear broth or consume(fat free) Sugar, honey syrup  Sample Menu Breakfast                                Lunch                                     Supper Cranberry juice                    Beef broth                            Chicken broth Jell-O                                     Grape juice                           Apple juice Coffee or tea                        Jell-O                                      Popsicle                                                Coffee or tea                        Coffee or tea  _____________________________________________________________________  [facility] - Preparing for Surgery Before surgery, you can play an important role.  Because skin is not sterile, your skin needs to be as free of germs as possible.  You can reduce the number of germs on your skin by washing with CHG (chlorahexidine gluconate) soap before surgery.  CHG is an antiseptic cleaner which kills germs and bonds with the skin to continue killing germs even after washing. Please DO NOT use if you have an allergy to CHG or antibacterial soaps.  If your skin becomes reddened/irritated stop using the CHG and inform your nurse when you arrive at Short Stay. Do not shave (including legs and underarms) for at least 48 hours prior to the first CHG shower.  You may shave your face/neck. Please follow these instructions carefully:  1.  Shower with CHG Soap the night before surgery and the  morning of Surgery.  2.  If you choose to wash your hair, wash your hair first as usual with your  normal  shampoo.  3.  After you shampoo, rinse your hair and body thoroughly to remove the  shampoo.                           4.  Use CHG as you would any other liquid soap.  You can apply chg directly  to the skin and wash  Gently with a scrungie or clean washcloth.  5.  Apply the CHG Soap to your body ONLY  FROM THE NECK DOWN.   Do not use on face/ open                           Wound or open sores. Avoid contact with eyes, ears mouth and genitals (private parts).                       Wash face,  Genitals (private parts) with your normal soap.             6.  Wash thoroughly, paying special attention to the area where your surgery  will be performed.  7.  Thoroughly rinse your body with warm water from the neck down.  8.  DO NOT shower/wash with your normal soap after using and rinsing off  the CHG Soap.                9.  Pat yourself dry with a clean towel.            10.  Wear clean pajamas.            11.  Place clean sheets on your bed the night of your first shower and do not  sleep with pets. Day of Surgery : Do not apply any lotions/deodorants the morning of surgery.  Please wear clean clothes to the hospital/surgery center.  FAILURE TO FOLLOW THESE INSTRUCTIONS MAY RESULT IN THE CANCELLATION OF YOUR SURGERY PATIENT SIGNATURE_________________________________  NURSE SIGNATURE__________________________________  ________________________________________________________________________

## 2017-09-12 ENCOUNTER — Telehealth: Payer: Self-pay | Admitting: Allergy

## 2017-09-12 NOTE — Addendum Note (Signed)
Addended by: Theresia Lo on: 09/12/2017 03:46 PM   Modules accepted: Orders

## 2017-09-12 NOTE — Telephone Encounter (Signed)
Routed the order to [place]

## 2017-09-12 NOTE — Telephone Encounter (Signed)
-----   Message from [place], [place] sent at 09/10/2017  3:36 PM EST ----- Please refer patient to Sleep Medicine for first available appointment for the following:   Obstructive sleep apnea  - sleep study shows severe sleep apnea and CPAP is the treatment of choice.  We will try calling your sleep study center to facilitate you getting restarted on CPAP.    Thank you!

## 2017-09-15 ENCOUNTER — Encounter (HOSPITAL_COMMUNITY)
Admission: RE | Admit: 2017-09-15 | Discharge: 2017-09-15 | Disposition: A | Discharge status: Home / Self Care | Payer: BC Managed Care – PPO | Source: Ambulatory Visit | Attending: Surgery | Admitting: Surgery

## 2017-09-15 ENCOUNTER — Encounter (HOSPITAL_COMMUNITY): Payer: Self-pay

## 2017-09-15 ENCOUNTER — Ambulatory Visit: Payer: BC Managed Care – PPO | Admitting: Allergy

## 2017-09-15 ENCOUNTER — Other Ambulatory Visit: Payer: Self-pay

## 2017-09-15 HISTORY — DX: Polyp of colon: K63.5

## 2017-09-15 HISTORY — DX: Cardiac murmur, unspecified: R01.1

## 2017-09-15 LAB — CBC WITH DIFFERENTIAL/PLATELET
BASOS PCT: 0 %
Basophils Absolute: 0 10*3/uL (ref 0.0–0.1)
Eosinophils Absolute: 0.1 10*3/uL (ref 0.0–0.7)
Eosinophils Relative: 1 %
HCT: 37.9 % (ref 36.0–46.0)
HEMOGLOBIN: 12.8 g/dL (ref 12.0–15.0)
LYMPHS ABS: 2.2 10*3/uL (ref 0.7–4.0)
Lymphocytes Relative: 25 %
MCH: 30.5 pg (ref 26.0–34.0)
MCHC: 33.8 g/dL (ref 30.0–36.0)
MCV: 90.2 fL (ref 78.0–100.0)
MONOS PCT: 6 %
Monocytes Absolute: 0.5 10*3/uL (ref 0.1–1.0)
NEUTROS PCT: 68 %
Neutro Abs: 5.9 10*3/uL (ref 1.7–7.7)
Platelets: 289 10*3/uL (ref 150–400)
RBC: 4.2 MIL/uL (ref 3.87–5.11)
RDW: 13.2 % (ref 11.5–15.5)
WBC: 8.7 10*3/uL (ref 4.0–10.5)

## 2017-09-15 LAB — COMPREHENSIVE METABOLIC PANEL
ALBUMIN: 3.9 g/dL (ref 3.5–5.0)
ALT: 20 U/L (ref 14–54)
AST: 25 U/L (ref 15–41)
Alkaline Phosphatase: 94 U/L (ref 38–126)
Anion gap: 6 (ref 5–15)
BILIRUBIN TOTAL: 0.6 mg/dL (ref 0.3–1.2)
BUN: 13 mg/dL (ref 6–20)
CO2: 28 mmol/L (ref 22–32)
Calcium: 10.1 mg/dL (ref 8.9–10.3)
Chloride: 104 mmol/L (ref 101–111)
Creatinine, Ser: 0.59 mg/dL (ref 0.44–1.00)
GFR calc non Af Amer: >60 mL/min (ref >60)
Glucose, Bld: 107 mg/dL — ABNORMAL HIGH (ref 65–99)
POTASSIUM: 4 mmol/L (ref 3.5–5.1)
Sodium: 138 mmol/L (ref 135–145)
Total Protein: 7 g/dL (ref 6.5–8.1)

## 2017-09-15 LAB — ABO/RH: ABO/RH(D): O POS

## 2017-09-15 LAB — HEMOGLOBIN A1C
HEMOGLOBIN A1C: 5 % (ref 4.8–5.6)
MEAN PLASMA GLUCOSE: 96.8 mg/dL

## 2017-09-18 ENCOUNTER — Inpatient Hospital Stay (HOSPITAL_COMMUNITY): Payer: BC Managed Care – PPO | Admitting: Anesthesiology

## 2017-09-18 ENCOUNTER — Inpatient Hospital Stay (HOSPITAL_COMMUNITY)
Admission: RE | Admit: 2017-09-18 | Discharge: 2017-09-25 | DRG: 330 | Disposition: A | Discharge status: Home Health | Payer: BC Managed Care – PPO | Source: Ambulatory Visit | Attending: Surgery | Admitting: Surgery

## 2017-09-18 ENCOUNTER — Encounter (HOSPITAL_COMMUNITY): Payer: Self-pay | Admitting: *Deleted

## 2017-09-18 ENCOUNTER — Encounter (HOSPITAL_COMMUNITY)
Admission: RE | Disposition: A | Discharge status: Home Health | Payer: Self-pay | Source: Ambulatory Visit | Attending: Surgery

## 2017-09-18 ENCOUNTER — Other Ambulatory Visit: Payer: Self-pay

## 2017-09-18 DIAGNOSIS — J45909 Unspecified asthma, uncomplicated: Secondary | ICD-10-CM | POA: Diagnosis present

## 2017-09-18 DIAGNOSIS — D120 Benign neoplasm of cecum: Secondary | ICD-10-CM | POA: Diagnosis present

## 2017-09-18 DIAGNOSIS — Z90710 Acquired absence of both cervix and uterus: Secondary | ICD-10-CM | POA: Diagnosis not present

## 2017-09-18 DIAGNOSIS — Z79899 Other long term (current) drug therapy: Secondary | ICD-10-CM

## 2017-09-18 DIAGNOSIS — Z5331 Laparoscopic surgical procedure converted to open procedure: Secondary | ICD-10-CM | POA: Diagnosis not present

## 2017-09-18 DIAGNOSIS — C180 Malignant neoplasm of cecum: Principal | ICD-10-CM | POA: Diagnosis present

## 2017-09-18 DIAGNOSIS — K219 Gastro-esophageal reflux disease without esophagitis: Secondary | ICD-10-CM | POA: Diagnosis present

## 2017-09-18 DIAGNOSIS — K567 Ileus, unspecified: Secondary | ICD-10-CM | POA: Diagnosis not present

## 2017-09-18 DIAGNOSIS — I10 Essential (primary) hypertension: Secondary | ICD-10-CM | POA: Diagnosis present

## 2017-09-18 DIAGNOSIS — C779 Secondary and unspecified malignant neoplasm of lymph node, unspecified: Secondary | ICD-10-CM | POA: Diagnosis present

## 2017-09-18 DIAGNOSIS — G4730 Sleep apnea, unspecified: Secondary | ICD-10-CM | POA: Diagnosis present

## 2017-09-18 DIAGNOSIS — Z6841 Body Mass Index (BMI) 40.0 and over, adult: Secondary | ICD-10-CM | POA: Diagnosis not present

## 2017-09-18 DIAGNOSIS — K635 Polyp of colon: Secondary | ICD-10-CM | POA: Diagnosis present

## 2017-09-18 DIAGNOSIS — K660 Peritoneal adhesions (postprocedural) (postinfection): Secondary | ICD-10-CM | POA: Diagnosis present

## 2017-09-18 HISTORY — PX: COLONOSCOPY: SHX5424

## 2017-09-18 HISTORY — DX: Sleep apnea, unspecified: G47.30

## 2017-09-18 HISTORY — PX: LAPAROSCOPIC RIGHT HEMI COLECTOMY: SHX5926

## 2017-09-18 LAB — TYPE AND SCREEN
ABO/RH(D): O POS
Antibody Screen: NEGATIVE

## 2017-09-18 SURGERY — LAPAROSCOPIC RIGHT HEMI COLECTOMY
Anesthesia: General | Site: Abdomen | Laterality: Right

## 2017-09-18 MED ORDER — DEXAMETHASONE SODIUM PHOSPHATE 10 MG/ML IJ SOLN
INTRAMUSCULAR | Status: DC | PRN
  Administered 2017-09-18: 10 mg via INTRAVENOUS

## 2017-09-18 MED ORDER — ONDANSETRON HCL 4 MG/2ML IJ SOLN
INTRAMUSCULAR | Status: AC
  Filled 2017-09-18: qty 2

## 2017-09-18 MED ORDER — GABAPENTIN 300 MG PO CAPS
300.0000 mg | ORAL_CAPSULE | Freq: Two times a day (BID) | ORAL | Status: DC
  Administered 2017-09-18 – 2017-09-25 (×14): 300 mg via ORAL
  Filled 2017-09-18 (×14): qty 1

## 2017-09-18 MED ORDER — MOMETASONE FURO-FORMOTEROL FUM 200-5 MCG/ACT IN AERO
2.0000 | INHALATION_SPRAY | Freq: Two times a day (BID) | RESPIRATORY_TRACT | Status: DC
  Administered 2017-09-18 – 2017-09-25 (×14): 2 via RESPIRATORY_TRACT
  Filled 2017-09-18: qty 8.8

## 2017-09-18 MED ORDER — METOPROLOL TARTRATE 5 MG/5ML IV SOLN: 5.0000 mg | Freq: Four times a day (QID) | INTRAVENOUS | Status: DC | PRN

## 2017-09-18 MED ORDER — ONDANSETRON HCL 4 MG/2ML IJ SOLN
INTRAMUSCULAR | Status: DC | PRN
  Administered 2017-09-18: 4 mg via INTRAVENOUS

## 2017-09-18 MED ORDER — ROCURONIUM BROMIDE 50 MG/5ML IV SOSY
PREFILLED_SYRINGE | INTRAVENOUS | Status: AC
  Filled 2017-09-18: qty 5

## 2017-09-18 MED ORDER — LIDOCAINE HCL 2 % IJ SOLN
INTRAMUSCULAR | Status: AC
  Filled 2017-09-18: qty 20

## 2017-09-18 MED ORDER — FENTANYL CITRATE (PF) 100 MCG/2ML IJ SOLN
INTRAMUSCULAR | Status: DC | PRN
  Administered 2017-09-18 (×2): 50 ug via INTRAVENOUS
  Administered 2017-09-18: 100 ug via INTRAVENOUS

## 2017-09-18 MED ORDER — BUPIVACAINE LIPOSOME 1.3 % IJ SUSP
20.0000 mL | Freq: Once | INTRAMUSCULAR | Status: AC
  Administered 2017-09-18: 20 mL
  Filled 2017-09-18: qty 20

## 2017-09-18 MED ORDER — HYDROMORPHONE HCL 1 MG/ML IJ SOLN: 0.2500 mg | INTRAMUSCULAR | Status: DC | PRN

## 2017-09-18 MED ORDER — MIDAZOLAM HCL 2 MG/2ML IJ SOLN
INTRAMUSCULAR | Status: AC
  Filled 2017-09-18: qty 2

## 2017-09-18 MED ORDER — ACETAMINOPHEN 325 MG PO TABS
650.0000 mg | ORAL_TABLET | Freq: Four times a day (QID) | ORAL | Status: DC
  Administered 2017-09-18 – 2017-09-25 (×28): 650 mg via ORAL
  Filled 2017-09-18 (×27): qty 2

## 2017-09-18 MED ORDER — ALBUTEROL SULFATE (2.5 MG/3ML) 0.083% IN NEBU: 2.5000 mg | INHALATION_SOLUTION | RESPIRATORY_TRACT | Status: DC | PRN

## 2017-09-18 MED ORDER — GABAPENTIN 300 MG PO CAPS
300.0000 mg | ORAL_CAPSULE | ORAL | Status: AC
  Administered 2017-09-18: 300 mg via ORAL
  Filled 2017-09-18: qty 1

## 2017-09-18 MED ORDER — OXYCODONE HCL 5 MG PO TABS
10.0000 mg | ORAL_TABLET | Freq: Four times a day (QID) | ORAL | Status: DC | PRN
  Administered 2017-09-22: 10 mg via ORAL
  Filled 2017-09-18: qty 2

## 2017-09-18 MED ORDER — PHENYLEPHRINE HCL 10 MG/ML IJ SOLN
INTRAMUSCULAR | Status: DC | PRN
  Administered 2017-09-18 (×5): 80 ug via INTRAVENOUS

## 2017-09-18 MED ORDER — ROCURONIUM BROMIDE 100 MG/10ML IV SOLN
INTRAVENOUS | Status: DC | PRN
  Administered 2017-09-18 (×2): 10 mg via INTRAVENOUS
  Administered 2017-09-18 (×3): 20 mg via INTRAVENOUS
  Administered 2017-09-18: 50 mg via INTRAVENOUS

## 2017-09-18 MED ORDER — ALVIMOPAN 12 MG PO CAPS
12.0000 mg | ORAL_CAPSULE | ORAL | Status: AC
  Administered 2017-09-18: 12 mg via ORAL
  Filled 2017-09-18: qty 1

## 2017-09-18 MED ORDER — NALOXONE HCL 0.4 MG/ML IJ SOLN: 0.4000 mg | INTRAMUSCULAR | Status: DC | PRN

## 2017-09-18 MED ORDER — CEFOTETAN DISODIUM-DEXTROSE 2-2.08 GM-%(50ML) IV SOLR
2.0000 g | INTRAVENOUS | Status: AC
  Administered 2017-09-18: 2 g via INTRAVENOUS
  Filled 2017-09-18: qty 50

## 2017-09-18 MED ORDER — CHLORHEXIDINE GLUCONATE CLOTH 2 % EX PADS: 6.0000 | MEDICATED_PAD | Freq: Once | CUTANEOUS | Status: DC

## 2017-09-18 MED ORDER — FENTANYL CITRATE (PF) 250 MCG/5ML IJ SOLN
INTRAMUSCULAR | Status: AC
  Filled 2017-09-18: qty 5

## 2017-09-18 MED ORDER — HYDROMORPHONE 1 MG/ML IV SOLN
INTRAVENOUS | Status: DC
  Administered 2017-09-18: 16:00:00 via INTRAVENOUS
  Administered 2017-09-18: 1.2 mg via INTRAVENOUS
  Administered 2017-09-18: 17:00:00 via INTRAVENOUS
  Administered 2017-09-18: 1.5 mg via INTRAVENOUS
  Administered 2017-09-19: 0.6 mg via INTRAVENOUS
  Administered 2017-09-19: 0 mg via INTRAVENOUS
  Administered 2017-09-19 (×2): 0.9 mg via INTRAVENOUS
  Administered 2017-09-19: 1.5 mg via INTRAVENOUS
  Administered 2017-09-20: 0 mg via INTRAVENOUS
  Administered 2017-09-20: 0.3 mg via INTRAVENOUS
  Administered 2017-09-20 (×2): 0 mg via INTRAVENOUS
  Administered 2017-09-20 – 2017-09-21 (×5): 0.3 mg via INTRAVENOUS
  Filled 2017-09-18: qty 25

## 2017-09-18 MED ORDER — BUPIVACAINE-EPINEPHRINE 0.25% -1:200000 IJ SOLN
INTRAMUSCULAR | Status: AC
  Filled 2017-09-18: qty 1

## 2017-09-18 MED ORDER — KETOTIFEN FUMARATE 0.025 % OP SOLN
1.0000 [drp] | Freq: Two times a day (BID) | OPHTHALMIC | Status: DC
  Administered 2017-09-18 – 2017-09-25 (×14): 1 [drp] via OPHTHALMIC
  Filled 2017-09-18: qty 5

## 2017-09-18 MED ORDER — ACETAMINOPHEN 500 MG PO TABS
1000.0000 mg | ORAL_TABLET | ORAL | Status: AC
  Administered 2017-09-18: 1000 mg via ORAL
  Filled 2017-09-18: qty 2

## 2017-09-18 MED ORDER — NEOMYCIN SULFATE 500 MG PO TABS: 1000.0000 mg | ORAL_TABLET | ORAL | Status: DC

## 2017-09-18 MED ORDER — PROPOFOL 10 MG/ML IV BOLUS
INTRAVENOUS | Status: DC | PRN
  Administered 2017-09-18: 150 mg via INTRAVENOUS

## 2017-09-18 MED ORDER — BUPIVACAINE-EPINEPHRINE 0.25% -1:200000 IJ SOLN
INTRAMUSCULAR | Status: DC | PRN
  Administered 2017-09-18: 30 mL

## 2017-09-18 MED ORDER — PHENYLEPHRINE HCL 10 MG/ML IJ SOLN
INTRAMUSCULAR | Status: AC
  Filled 2017-09-18: qty 1

## 2017-09-18 MED ORDER — MIDAZOLAM HCL 5 MG/5ML IJ SOLN
INTRAMUSCULAR | Status: DC | PRN
  Administered 2017-09-18 (×2): 1 mg via INTRAVENOUS

## 2017-09-18 MED ORDER — PHENYLEPHRINE HCL 10 MG/ML IJ SOLN
INTRAMUSCULAR | Status: DC | PRN
  Administered 2017-09-18: 25 ug/min via INTRAVENOUS

## 2017-09-18 MED ORDER — ONDANSETRON HCL 4 MG/2ML IJ SOLN: 4.0000 mg | Freq: Four times a day (QID) | INTRAMUSCULAR | Status: DC | PRN

## 2017-09-18 MED ORDER — SUGAMMADEX SODIUM 500 MG/5ML IV SOLN
INTRAVENOUS | Status: DC | PRN
  Administered 2017-09-18: 300 mg via INTRAVENOUS

## 2017-09-18 MED ORDER — LACTATED RINGERS IV SOLN
INTRAVENOUS | Status: DC
  Administered 2017-09-18: 1000 mL via INTRAVENOUS
  Administered 2017-09-18 – 2017-09-21 (×4): via INTRAVENOUS

## 2017-09-18 MED ORDER — DIPHENHYDRAMINE HCL 12.5 MG/5ML PO ELIX: 12.5000 mg | ORAL_SOLUTION | Freq: Four times a day (QID) | ORAL | Status: DC | PRN

## 2017-09-18 MED ORDER — PHENYLEPHRINE 40 MCG/ML (10ML) SYRINGE FOR IV PUSH (FOR BLOOD PRESSURE SUPPORT)
PREFILLED_SYRINGE | INTRAVENOUS | Status: AC
  Filled 2017-09-18: qty 10

## 2017-09-18 MED ORDER — ONDANSETRON HCL 4 MG/2ML IJ SOLN: 4.0000 mg | Freq: Once | INTRAMUSCULAR | Status: DC | PRN

## 2017-09-18 MED ORDER — DIPHENHYDRAMINE HCL 50 MG/ML IJ SOLN: 12.5000 mg | Freq: Four times a day (QID) | INTRAMUSCULAR | Status: DC | PRN

## 2017-09-18 MED ORDER — LACTATED RINGERS IV SOLN
INTRAVENOUS | Status: DC
  Administered 2017-09-18 (×3): via INTRAVENOUS

## 2017-09-18 MED ORDER — PROPOFOL 10 MG/ML IV BOLUS
INTRAVENOUS | Status: AC
  Filled 2017-09-18: qty 20

## 2017-09-18 MED ORDER — POLYETHYLENE GLYCOL 3350 17 GM/SCOOP PO POWD: 1.0000 | Freq: Once | ORAL | Status: DC

## 2017-09-18 MED ORDER — SUGAMMADEX SODIUM 500 MG/5ML IV SOLN
INTRAVENOUS | Status: AC
  Filled 2017-09-18: qty 5

## 2017-09-18 MED ORDER — LIDOCAINE 2% (20 MG/ML) 5 ML SYRINGE
INTRAMUSCULAR | Status: DC | PRN
  Administered 2017-09-18: 1.5 mg/kg/h via INTRAVENOUS

## 2017-09-18 MED ORDER — ALVIMOPAN 12 MG PO CAPS
12.0000 mg | ORAL_CAPSULE | Freq: Two times a day (BID) | ORAL | Status: DC
  Administered 2017-09-19 – 2017-09-20 (×4): 12 mg via ORAL
  Filled 2017-09-18 (×4): qty 1

## 2017-09-18 MED ORDER — LIDOCAINE HCL (CARDIAC) 20 MG/ML IV SOLN
INTRAVENOUS | Status: DC | PRN
  Administered 2017-09-18: 50 mg via INTRAVENOUS

## 2017-09-18 MED ORDER — ONDANSETRON HCL 4 MG PO TABS: 4.0000 mg | ORAL_TABLET | Freq: Four times a day (QID) | ORAL | Status: DC | PRN

## 2017-09-18 MED ORDER — METRONIDAZOLE 500 MG PO TABS: 1000.0000 mg | ORAL_TABLET | ORAL | Status: DC

## 2017-09-18 MED ORDER — HEPARIN SODIUM (PORCINE) 5000 UNIT/ML IJ SOLN
5000.0000 [IU] | Freq: Once | INTRAMUSCULAR | Status: AC
  Administered 2017-09-18: 5000 [IU] via SUBCUTANEOUS
  Filled 2017-09-18: qty 1

## 2017-09-18 MED ORDER — MEPERIDINE HCL 50 MG/ML IJ SOLN: 6.2500 mg | INTRAMUSCULAR | Status: DC | PRN

## 2017-09-18 MED ORDER — SODIUM CHLORIDE 0.9% FLUSH: 9.0000 mL | INTRAVENOUS | Status: DC | PRN

## 2017-09-18 MED ORDER — 0.9 % SODIUM CHLORIDE (POUR BTL) OPTIME
TOPICAL | Status: DC | PRN
  Administered 2017-09-18: 1250 mL

## 2017-09-18 MED ORDER — MONTELUKAST SODIUM 10 MG PO TABS
10.0000 mg | ORAL_TABLET | Freq: Every day | ORAL | Status: DC
  Administered 2017-09-18 – 2017-09-24 (×7): 10 mg via ORAL
  Filled 2017-09-18 (×7): qty 1

## 2017-09-18 MED ORDER — PANTOPRAZOLE SODIUM 40 MG PO TBEC
40.0000 mg | DELAYED_RELEASE_TABLET | Freq: Every day | ORAL | Status: DC
  Administered 2017-09-19 – 2017-09-25 (×7): 40 mg via ORAL
  Filled 2017-09-18 (×8): qty 1

## 2017-09-18 SURGICAL SUPPLY — 77 items
ADH SKN CLS APL DERMABOND .7 (GAUZE/BANDAGES/DRESSINGS) ×2
APPLIER CLIP 5 13 M/L LIGAMAX5 (MISCELLANEOUS)
APPLIER CLIP ROT 10 11.4 M/L (STAPLE)
APR CLP MED LRG 11.4X10 (STAPLE)
APR CLP MED LRG 5 ANG JAW (MISCELLANEOUS)
BLADE EXTENDED COATED 6.5IN (ELECTRODE) ×1 IMPLANT
CABLE HIGH FREQUENCY MONO STRZ (ELECTRODE) ×3 IMPLANT
CATH MUSHROOM 28FR (CATHETERS) IMPLANT
CATH MUSHROOM 30FR (CATHETERS) IMPLANT
CELLS DAT CNTRL 66122 CELL SVR (MISCELLANEOUS) IMPLANT
CLIP APPLIE 5 13 M/L LIGAMAX5 (MISCELLANEOUS) IMPLANT
CLIP APPLIE ROT 10 11.4 M/L (STAPLE) IMPLANT
DECANTER SPIKE VIAL GLASS SM (MISCELLANEOUS) ×3 IMPLANT
DERMABOND ADVANCED (GAUZE/BANDAGES/DRESSINGS) ×1
DERMABOND ADVANCED .7 DNX12 (GAUZE/BANDAGES/DRESSINGS) IMPLANT
DISSECTOR BLUNT TIP ENDO 5MM (MISCELLANEOUS) IMPLANT
DRAIN CHANNEL 19F RND (DRAIN) IMPLANT
DRAPE SURG IRRIG POUCH 19X23 (DRAPES) ×3 IMPLANT
DRSG OPSITE POSTOP 4X10 (GAUZE/BANDAGES/DRESSINGS) IMPLANT
DRSG OPSITE POSTOP 4X6 (GAUZE/BANDAGES/DRESSINGS) IMPLANT
DRSG OPSITE POSTOP 4X8 (GAUZE/BANDAGES/DRESSINGS) ×1 IMPLANT
ELECT REM PT RETURN 15FT ADLT (MISCELLANEOUS) ×3 IMPLANT
EVACUATOR SILICONE 100CC (DRAIN) IMPLANT
GAUZE SPONGE 2X2 8PLY STRL LF (GAUZE/BANDAGES/DRESSINGS) IMPLANT
GAUZE SPONGE 4X4 12PLY STRL (GAUZE/BANDAGES/DRESSINGS) IMPLANT
GLOVE BIO SURGEON STRL SZ7.5 (GLOVE) ×6 IMPLANT
GLOVE INDICATOR 8.0 STRL GRN (GLOVE) ×6 IMPLANT
GOWN STRL REUS W/TWL XL LVL3 (GOWN DISPOSABLE) ×12 IMPLANT
HOLDER FOLEY CATH W/STRAP (MISCELLANEOUS) ×3 IMPLANT
KIT DEFENDO BUTTON (KITS) ×1 IMPLANT
LIGASURE IMPACT 36 18CM CVD LR (INSTRUMENTS) ×1 IMPLANT
PACK COLON (CUSTOM PROCEDURE TRAY) ×3 IMPLANT
PAD POSITIONING PINK XL (MISCELLANEOUS) ×3 IMPLANT
PORT LAP GEL ALEXIS MED 5-9CM (MISCELLANEOUS) ×1 IMPLANT
RELOAD PROXIMATE 75MM BLUE (ENDOMECHANICALS) ×6 IMPLANT
RELOAD STAPLE 75 3.8 BLU REG (ENDOMECHANICALS) IMPLANT
RETRACTOR WND ALEXIS 18 MED (MISCELLANEOUS) IMPLANT
RTRCTR WOUND ALEXIS 18CM MED (MISCELLANEOUS)
SCISSORS LAP 5X35 DISP (ENDOMECHANICALS) ×3 IMPLANT
SEALER TISSUE G2 STRG ARTC 35C (ENDOMECHANICALS) ×3 IMPLANT
SET IRRIG TUBING LAPAROSCOPIC (IRRIGATION / IRRIGATOR) ×3 IMPLANT
SHEARS HARMONIC ACE PLUS 36CM (ENDOMECHANICALS) IMPLANT
SLEEVE ADV FIXATION 5X100MM (TROCAR) ×6 IMPLANT
SPONGE DRAIN TRACH 4X4 STRL 2S (GAUZE/BANDAGES/DRESSINGS) IMPLANT
SPONGE GAUZE 2X2 STER 10/PKG (GAUZE/BANDAGES/DRESSINGS) ×1
SPONGE LAP 18X18 X RAY DECT (DISPOSABLE) ×2 IMPLANT
STAPLER GUN LINEAR PROX 60 (STAPLE) ×1 IMPLANT
STAPLER PROXIMATE 75MM BLUE (STAPLE) ×1 IMPLANT
STAPLER VISISTAT 35W (STAPLE) ×1 IMPLANT
SUT ETHILON 3 0 PS 1 (SUTURE) IMPLANT
SUT MNCRL AB 4-0 PS2 18 (SUTURE) ×2 IMPLANT
SUT PDS AB 1 CTX 36 (SUTURE) ×2 IMPLANT
SUT PDS AB 1 TP1 96 (SUTURE) IMPLANT
SUT PROLENE 2 0 KS (SUTURE) ×3 IMPLANT
SUT PROLENE 2 0 SH DA (SUTURE) ×3 IMPLANT
SUT SILK 2 0 (SUTURE) ×3
SUT SILK 2 0 SH CR/8 (SUTURE) ×3 IMPLANT
SUT SILK 2-0 18XBRD TIE 12 (SUTURE) ×2 IMPLANT
SUT SILK 3 0 (SUTURE) ×3
SUT SILK 3 0 SH CR/8 (SUTURE) ×4 IMPLANT
SUT SILK 3-0 18XBRD TIE 12 (SUTURE) ×2 IMPLANT
SUT VIC AB 3-0 SH 18 (SUTURE) IMPLANT
SUT VICRYL 0 UR6 27IN ABS (SUTURE) ×1 IMPLANT
SUT VICRYL 2 0 18  UND BR (SUTURE) ×1
SUT VICRYL 2 0 18 UND BR (SUTURE) ×2 IMPLANT
SYR 50ML LL SCALE MARK (SYRINGE) ×1 IMPLANT
SYS LAPSCP GELPORT 120MM (MISCELLANEOUS)
SYSTEM LAPSCP GELPORT 120MM (MISCELLANEOUS) IMPLANT
TAPE CLOTH 2X10 TAN LF (GAUZE/BANDAGES/DRESSINGS) ×1 IMPLANT
TOWEL OR 17X26 10 PK STRL BLUE (TOWEL DISPOSABLE) IMPLANT
TOWEL OR NON WOVEN STRL DISP B (DISPOSABLE) ×3 IMPLANT
TRAY FOLEY BAG SILVER LF 14FR (CATHETERS) IMPLANT
TRAY FOLEY CATH SILVER 14FR (SET/KITS/TRAYS/PACK) ×1 IMPLANT
TRAY IRRIG W/60CC SYR STRL (SET/KITS/TRAYS/PACK) ×2 IMPLANT
TROCAR ADV FIXATION 5X100MM (TROCAR) ×3 IMPLANT
TROCAR XCEL BLUNT TIP 100MML (ENDOMECHANICALS) ×3 IMPLANT
TUBING INSUF HEATED (TUBING) ×3 IMPLANT

## 2017-09-18 NOTE — Anesthesia Preprocedure Evaluation (Addendum)
Anesthesia Evaluation  Patient identified by MRN, date of birth, ID band Patient awake    Reviewed: Allergy & Precautions, NPO status , Patient's Chart, lab work & pertinent test results  Airway Mallampati: II  TM Distance: >3 FB Neck ROM: Full    Dental   Pulmonary sleep apnea ,    Pulmonary exam normal        Cardiovascular hypertension, Pt. on medications Normal cardiovascular exam     Neuro/Psych    GI/Hepatic GERD  Medicated and Controlled,  Endo/Other    Renal/GU      Musculoskeletal   Abdominal   Peds  Hematology   Anesthesia Other Findings   Reproductive/Obstetrics                             Anesthesia Physical Anesthesia Plan  ASA: II  Anesthesia Plan: General   Post-op Pain Management:    Induction: Intravenous  PONV Risk Score and Plan: 3 and Ondansetron and Dexamethasone  Airway Management Planned: Oral ETT  Additional Equipment:   Intra-op Plan:   Post-operative Plan: Extubation in OR  Informed Consent: I have reviewed the patients History and Physical, chart, labs and discussed the procedure including the risks, benefits and alternatives for the proposed anesthesia with the patient or authorized representative who has indicated his/her understanding and acceptance.     Plan Discussed with: CRNA and Surgeon  Anesthesia Plan Comments:         Anesthesia Quick Evaluation

## 2017-09-18 NOTE — Transfer of Care (Signed)
Immediate Anesthesia Transfer of Care Note  Patient: Sharon Allison  Procedure(s) Performed: LAPAROSCOPIC RIGHT HEMI COLECTOMY ERAS PATHWAY (Right Abdomen) COLONOSCOPY (N/A )  Patient Location: PACU  Anesthesia Type:General  Level of Consciousness: drowsy and patient cooperative  Airway & Oxygen Therapy: Patient Spontanous Breathing and Patient connected to face mask oxygen  Post-op Assessment: Report given to RN and Post -op Vital signs reviewed and stable  Post vital signs: Reviewed and stable  Last Vitals:  Vitals:   09/18/17 0822 09/18/17 1453  BP: (!) 169/85 133/74  Pulse: 91 82  Resp: 18   Temp: 36.9 C   SpO2: 95% 96%    Last Pain:  Vitals:   09/18/17 0822  TempSrc: Oral         Complications: No apparent anesthesia complications

## 2017-09-18 NOTE — Op Note (Signed)
PATIENT: Sharon Allison  62 y.o. female  Patient Care Team: Regina Eck, CNM as PCP - General (Certified Nurse Midwife)  PREOP DIAGNOSIS: colon mass  POSTOP DIAGNOSIS: colon mass  PROCEDURE: 1. Laparoscopic converted open right hemicolectomy 2. Colonoscopy  SURGEON: Sharon Mt. Dema Severin, MD  ASSISTANT: Stark Klein, MD  ANESTHESIA: General endotracheal  EBL: 200cc Total I/O In: 2000 [I.V.:2000] Out: 335 [Urine:235; Blood:100]  DRAINS: None  SPECIMEN: Terminal ileum, right colon en bloc  COUNTS: Sponge, needle and instrument counts were reported correct x2  FINDINGS: Colonoscopy performed as lesion had not been tattoo'd and needed to be localized prior to resection. Dense abdominal wall adhesions with omentum; pelvic adhesions also quite dense from prior surgery - majority of small bowel including terminal ileum were adhesed in the pelvis necessitating conversion to open for adhesiolysis. No obvious metastatic disease on visceral parietal peritoneum or liver.  STATEMENT OF MEDICAL NECESSITY: [name] is a very pleasant 62 year old female referred to me for evaluation of endoscopically unretrievable carpeting polyp in the cecum. She underwent routine screening colonoscopy at the age of 45 and had no polyps that time. She subsequently underwent her 62 year old screening colonoscopy on 05/21/17 by Dr.Jyothi Collene Mares. A small sessile polyp was found the rectum and removed with cold biopsy forceps pathology returned benign hyperplastic polyp. Additionally she had a a carpeting polyp in the cecum which was endoscopically only partially removed and the remainder was not removable - this was not tattooed. 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 My partner Dr.Gerkin is seeing her for workup of possible hyperparathyroidism/hypercalcemia. She denies symptoms today including abdominal pain, bleeding, black or tarry stool. Her weight has  been stable.  The anatomy, physiology and pathophysiology of the digestive tract was discussed with the patient using pictures and diagrams. The natural history of the disease without surgery was discussed.  I worked to give an overview of the treatment approach including the the frequent need to have multidisciplinary involvement.  I feel the risks of no intervention will lead to serious problems that outweigh the operative risks; therefore, I recommended a partial colectomy to address the pathology.  Laparoscopic & open techniques were discussed in great detail. The possible need for stoma was also discussed. The material risks (including but not limited to pain, bleeding, infection, scarring, abscess, anastomotic leak, need for additional procedures, possible ostomy, hernia, heart attack, stroke and death) benefits and alternatives were discussed. Goals of post-operative recovery were discussed as well. The patient expressed understanding and wished to proceed with surgery.  NARRATIVE:  The patient was identified & brought into the operating room, placed supine on the operating table and SCDs were applied to the lower extremities. General endotracheal anesthesia was induced. The patient was positioned supine with arms tucked. Antibiotics were administered. A foley catheter was placed under sterile conditions. The abdomen was prepped and draped in a sterile fashion. A timeout was performed confirming our patient and plan.  An infraumbilical incision was made and dissection carried down to the umbilical stalk. This was grasped with a kocher and retracted outwardly. The midline infraumbilical fascia was scored with cautery. Blunt dissection was used to gain peritoneal entry. A Hasson trocar was placed. The abdomen was insufflated to 15 mmHg with CO2.  Camera inspection revealed no evidence of injury. Additional ports were then placed under direct laparoscopic visualization. The abdomen was surveyed.  The liver  and peritoneum appeared normal.  There were no signs of metastatic disease.  Adhesions  to the abdominal wall of omentum from her prior gynecologic surgeries was then lysed.  The patient was positioned in trendelenburg with left side down.  The omentum was swept from the right lower quadrant and the cecum identified.  This was traced back to terminal ileum.  A gentle grasper was then placed on the terminal ileum to occluded for planned colonoscopy.  At this point attention was turned to performing the colonoscopy.  The colonoscope was inserted in the anus and under direct visualization advanced to the cecum.  The appendiceal orifice was identified and using additional lap scopic guidance position of the cecum was confirmed.  The polyp was visualized within the cecum, it was carpeting, and clearly not amenable to endoscopic removal.  Out of the polyp and localized the colonoscope was then gently removed while decompressing the colon on the way out.  Attention was then turned back to performing laparoscopy and gowns and gloves were changed.  The cecum and ascending colon were mobilized along the Ross Hefferan line of Toldt in the avascular plane reflecting the colon medially up to the level of the hepatic flexure.  Once this had been completed attention was turned to the hepatic flexure mobilization.  The transverse colon was retracted caudad and the omentum elevated anteriorly.  A plane just above the colon was then developed and entered in the lesser sac was entered.  This was opened along the hepatic flexure facilitating mobilization of the hepatic flexure of the colon caudad.  The duodenum was identified and protected free of injury.  Once the proximal transverse colon, hepatic flexure, ascending colon and cecum had been fully mobilized attention was turned to mobilizing the terminal ileum.  The terminal ileum was found to be running posteriorly and down into the pelvis.  Multiple other loops of small bowel are  also in the pelvis and matted together presumably from her prior gynecologic procedures.  Careful and tedious retraction and adhesiolysis was then performed sharply but ultimately visualization was quite difficult, retraction was difficult and the decision was made to convert to open.  The laparoscopic trochars were removed under direct visualization.  A midline incision was then made around the umbilicus and carried down to approximately 2 cm above the pubic symphysis.  Subcutaneous tissue was divided and the fascia was incised incorporating the already present fascial opening from the umbilical trocar site.  A Bookwalter retractor was then placed.  The small bowel was then freed from its adhesions to the pelvis sharply.  This adhesiolysis took approximately 1 hour.  Care was taken to steer clear of the pelvic sidewall on either side.  One small deserosalization occurred and was repaired with 3-0 silk sutures. Once the terminal ileum and approximately 2 feet of ileum were free from the pelvis and easily reached the upper midline attention was then turned to performing the right hemicolectomy.  The terminal ileum and cecum were elevated and the ileocolic pedicle was identified.  This was circumferentially dissected again taking care to identify and preserve the duodenum free of injury.  This was then ligated using a LigaSure in a rather high location.  There was what felt to be enlarged lymph nodes within the ileocolic pedicle and these were taken with the specimen.  The terminal ileum was divided using a GIA blue load approximately 10-15 cm from the ileocecal valve and at the location of the vision the bowel was pink and healthy in appearance.  The distal point of transection was then identified on the portion of  the transverse colon just distal to the hepatic flexure.  A window was made in the mesentery along the border of the proximal transverse colon and the colon was divided using a GIA blue load.  The  intervening mesentery was then ligated using the LigaSure, again taking care to protect the duodenum.  The terminal ileum, cecum, ascending colon and proximal transverse colon were then passed off as specimen.  Hemostasis was then verified.  Attention was then turned to performing the side to side, functional end-to-end ileocolic anastomosis. The anastomosis was created between the terminal ileum and the transverse colon on the antimesenteric border of both respective segments of bowel. This was done using a GIA blue load stapler.  The common enterotomy channel was closed using a TA 60 blue load stapler. Several 3-0 silk sutures were used to imbricate the edge of the anastomosis and effectively dunk the corners. Two anti-tension sutures were placed in the crotch of the anastomosis. This was then placed back into the abdomen. The abdomen was then irrigated with sterile saline. The omentum was then brought down over the anastomosis. At this point, the entire team switched to clean instruments, gowns and drapes.   The fascia was then closed with 2 running #1 looped PDS sutures.  The wound was then irrigated with additional sterile saline.  The skin of all incision sites was then closed with staples and sterile dressings were placed. The patient was then awakened from anesthesia and transferred to a stretcher for transport to PACU in satisfactory condition.

## 2017-09-18 NOTE — Anesthesia Postprocedure Evaluation (Signed)
Anesthesia Post Note  Patient: Sharon Allison  Procedure(s) Performed: LAPAROSCOPIC RIGHT HEMI COLECTOMY ERAS PATHWAY (Right Abdomen) COLONOSCOPY (N/A )     Patient location during evaluation: PACU Anesthesia Type: General Level of consciousness: awake and alert Pain management: pain level controlled Vital Signs Assessment: post-procedure vital signs reviewed and stable Respiratory status: spontaneous breathing, nonlabored ventilation, respiratory function stable and patient connected to nasal cannula oxygen Cardiovascular status: blood pressure returned to baseline and stable Postop Assessment: no apparent nausea or vomiting Anesthetic complications: no    Last Vitals:  Vitals:   09/18/17 1515 09/18/17 1530  BP: 138/73 138/81  Pulse: 73 72  Resp: 20 18  Temp:  37 C  SpO2: 99% 100%    Last Pain:  Vitals:   09/18/17 1530  TempSrc:   PainSc: 0-No pain                 Tahmir Kleckner S

## 2017-09-18 NOTE — Anesthesia Procedure Notes (Signed)
Procedure Name: Intubation Date/Time: 09/18/2017 9:52 AM Performed by: Glory Buff, CRNA Pre-anesthesia Checklist: Patient identified, Emergency Drugs available, Suction available and Patient being monitored Patient Re-evaluated:Patient Re-evaluated prior to induction Oxygen Delivery Method: Circle system utilized Preoxygenation: Pre-oxygenation with 100% oxygen Induction Type: IV induction Ventilation: Mask ventilation without difficulty Laryngoscope Size: Miller and 3 Grade View: Grade I Tube type: Oral Tube size: 7.0 mm Number of attempts: 1 Airway Equipment and Method: Stylet and Oral airway Placement Confirmation: ETT inserted through vocal cords under direct vision,  positive ETCO2 and breath sounds checked- equal and bilateral Secured at: 20 cm Tube secured with: Tape Dental Injury: Teeth and Oropharynx as per pre-operative assessment

## 2017-09-18 NOTE — H&P (Signed)
CC: Referred for endoscopically unretrievable cecal carpeting adenomatous polyp  HPI: Sharon Allison is a very pleasant 62 year old female referred to me for evaluation of endoscopically unretrievable carpeting polyp in the cecum.  She underwent routine screening colonoscopy at the age of 50 and had no polyps that time.  She subsequently underwent her 62 year old screening colonoscopy on 05/21/17 by Dr.Jyothi Collene Mares.  A small sessile polyp was found the rectum and removed with cold biopsy forceps pathology returned benign hyperplastic polyp.  Additionally she had a a carpeting polyp in the cecum which is unclear if was tattooed but was endoscopically only partially removed and the remainder was not removable.  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  My partner Dr.Gerkin is seeing her for workup of possible hyperparathyroidism/hypercalcemia.  She denies symptoms today including abdominal pain, bleeding, black or tarry stool.  Her weight has been stable.  PMH: Asthma (well controlled on inhalers); obstructive sleep apnea  PSH: Prior open via Pfannenstiel hysterectomy in 2002 for fibroids.  Her ovaries are still in place.  Social: Denies use of tobacco/EtOH/drugs  FHx: Denies FHx of malignancy  ROS: A comprehensive 10 system review of systems was completed and pertinent findings noted above.  Allergies Iodine *CHEMICALS*   Sulfa Antibiotics    Medication History Singulair  (10MG  Tablet, Oral) Active. Artificial Tear Ointment  (Ophthalmic) Active. Zaditor  (0.025% Solution, Ophthalmic) Active. Systane  (0.4-0.3% Solution, Ophthalmic) Active. B Complex Vitamins  (Oral) Active. Albuterol  (90MCG/ACT Aerosol Soln, Inhalation) Active. Budesonide-Formoterol Fumarate  (160-4.5MCG/ACT Aerosol, Inhalation) Active. Vitamin C  (Oral) Specific strength unknown - Active. Vitamin D (Cholecalciferol)  (Oral) Specific strength unknown - Active. Prevacid 24HR  (15MG   Capsule DR, Oral) Active. Medications Reconciled     Review of Systems General Present- Fatigue and Weight Gain. Not Present- Appetite Loss, Chills, Fever, Night Sweats and Weight Loss. Skin Not Present- Change in Wart/Mole, Dryness, Hives, Jaundice, New Lesions, Non-Healing Wounds, Rash and Ulcer. HEENT Present- Seasonal Allergies. Not Present- Earache, Hearing Loss, Hoarseness, Nose Bleed, Oral Ulcers, Ringing in the Ears, Sinus Pain, Sore Throat, Visual Disturbances, Wears glasses/contact lenses and Yellow Eyes. Respiratory Present- Snoring and Wheezing. Not Present- Bloody sputum, Chronic Cough and Difficulty Breathing. Breast Not Present- Breast Mass, Breast Pain, Nipple Discharge and Skin Changes. Cardiovascular Present- Shortness of Breath. Not Present- Chest Pain, Difficulty Breathing Lying Down, Leg Cramps, Palpitations, Rapid Heart Rate and Swelling of Extremities. Gastrointestinal Present- Bloating and Hemorrhoids. Not Present- Abdominal Pain, Bloody Stool, Change in Bowel Habits, Chronic diarrhea, Constipation, Difficulty Swallowing, Excessive gas, Gets full quickly at meals, Indigestion, Nausea, Rectal Pain and Vomiting. Female Genitourinary Not Present- Frequency, Nocturia, Painful Urination, Pelvic Pain and Urgency. Musculoskeletal Present- Back Pain. Not Present- Joint Pain, Joint Stiffness, Muscle Pain, Muscle Weakness and Swelling of Extremities. Neurological Present- Numbness. Not Present- Decreased Memory, Fainting, Headaches, Seizures, Tingling, Tremor, Trouble walking and Weakness. Psychiatric Not Present- Anxiety, Bipolar, Change in Sleep Pattern, Depression, Fearful and Frequent crying. Endocrine Not Present- Cold Intolerance, Excessive Hunger, Hair Changes, Heat Intolerance, Hot flashes and New Diabetes. Hematology Not Present- Blood Thinners, Easy Bruising, Excessive bleeding, Gland problems, HIV and Persistent Infections.   Physical Exam  Vitals:   09/18/17 0822   BP: (!) 169/85  Pulse: 91  Resp: 18  Temp: 98.5 F (36.9 C)  TempSrc: Oral  SpO2: 95%    Constitutional: No acute distress, conversant, no deformities Eyes: Moist conjunctiva; no lid lag; anicteric; pupils equal round and reactive to light Neck: Trachea midline;  no palpable thyromegaly Lungs: Normal respiratory effort; no tactile fremitus CV: Regular rate and rhythm; no palpable thrill; no pitting edema GI: Abdomen obese, soft, nontender, nondistended; no palpable hepatosplenomegaly MSK: Normal gait; no clubbing/cyanosis Psych: Appropriate affect; alert and oriented 3 Lymphatic: No palpable cervical or axillary lymphadenopathy  Assessment & Plan 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 -The anatomy and physiology of the GI tract was discussed at lenght with pictures and diagrams. The pathophysiology of colon polyps and their development into cancer was additionally discussed again with the same. The procedure, material risks (including, but not limited to, pain which could become chronic, bleeding, infection, scarring, recurrence, leak from anastomosis, damage to surrounding structures including nerves/vessels/viscus/bladder, need for additional procedures, need for stoma which could be permenant, hernia, heart attack, stroke, death) benefits and alternatives to surgery were described. Her questions were answered to her satisfaction and she elected to proceed with surgery.  Sharon Mt. Dema Severin, M.D. General and Colorectal Surgery [facility]

## 2017-09-19 ENCOUNTER — Encounter (HOSPITAL_COMMUNITY): Payer: Self-pay | Admitting: Surgery

## 2017-09-19 LAB — PHOSPHORUS: PHOSPHORUS: 2.9 mg/dL (ref 2.5–4.6)

## 2017-09-19 LAB — MAGNESIUM: MAGNESIUM: 1.8 mg/dL (ref 1.7–2.4)

## 2017-09-19 LAB — BASIC METABOLIC PANEL
Anion gap: 4 — ABNORMAL LOW (ref 5–15)
BUN: 9 mg/dL (ref 6–20)
CALCIUM: 9.4 mg/dL (ref 8.9–10.3)
CHLORIDE: 105 mmol/L (ref 101–111)
CO2: 30 mmol/L (ref 22–32)
CREATININE: 0.6 mg/dL (ref 0.44–1.00)
Glucose, Bld: 127 mg/dL — ABNORMAL HIGH (ref 65–99)
Potassium: 4.1 mmol/L (ref 3.5–5.1)
SODIUM: 139 mmol/L (ref 135–145)

## 2017-09-19 LAB — CBC
HCT: 35.7 % — ABNORMAL LOW (ref 36.0–46.0)
Hemoglobin: 11.8 g/dL — ABNORMAL LOW (ref 12.0–15.0)
MCH: 30.1 pg (ref 26.0–34.0)
MCHC: 33.1 g/dL (ref 30.0–36.0)
MCV: 91.1 fL (ref 78.0–100.0)
PLATELETS: 306 10*3/uL (ref 150–400)
RBC: 3.92 MIL/uL (ref 3.87–5.11)
RDW: 13.6 % (ref 11.5–15.5)
WBC: 13.1 10*3/uL — AB (ref 4.0–10.5)

## 2017-09-19 MED ORDER — KETOROLAC TROMETHAMINE 15 MG/ML IJ SOLN
15.0000 mg | Freq: Four times a day (QID) | INTRAMUSCULAR | Status: AC
  Administered 2017-09-19 – 2017-09-21 (×8): 15 mg via INTRAVENOUS
  Filled 2017-09-19 (×8): qty 1

## 2017-09-19 MED ORDER — TRAMADOL HCL 50 MG PO TABS: 50.0000 mg | ORAL_TABLET | Freq: Four times a day (QID) | ORAL | 0 refills | Status: AC | PRN

## 2017-09-19 MED ORDER — MAGNESIUM SULFATE 2 GM/50ML IV SOLN
2.0000 g | Freq: Once | INTRAVENOUS | Status: AC
  Administered 2017-09-19: 2 g via INTRAVENOUS
  Filled 2017-09-19: qty 50

## 2017-09-19 NOTE — Evaluation (Signed)
Physical Therapy Evaluation Patient Details Name: Sharon Allison MRN: [id number] DOB: 06/24/1956 Today's Date: 09/19/2017   History of Present Illness  Pt is a 61 year old female s/p LAPAROSCOPIC converted to open RIGHT HEMI COLECTOMY with adhesiolysis ERAS PATHWAY COLONOSCOPY 09/18/2017  Clinical Impression  Pt admitted with above diagnosis. Pt currently with functional limitations due to the deficits listed below (see PT Problem List).  Pt will benefit from skilled PT to increase their independence and safety with mobility to allow discharge to the venue listed below.  Pt assisted with ambulating in hallway with RW for support.  Pt lives alone and would like to return to previous independent lifestyle as quickly as possible.  Recommended RW in case pt needs upon d/c however may progress to no DME needs.    Follow Up Recommendations Home health PT    Equipment Recommendations  Rolling walker with 5" wheels    Recommendations for Other Services       Precautions / Restrictions Precautions Precautions: Fall      Mobility  Bed Mobility Overal bed mobility: Needs Assistance Bed Mobility: Supine to Sit     Supine to sit: Min assist     General bed mobility comments: assist for trunk upright  Transfers Overall transfer level: Needs assistance Equipment used: Rolling walker (2 wheeled) Transfers: Sit to/from Stand Sit to Stand: Min guard         General transfer comment: verbal cues for safe technique  Ambulation/Gait Ambulation/Gait assistance: Min guard Ambulation Distance (Feet): 200 Feet Assistive device: Rolling walker (2 wheeled) Gait Pattern/deviations: Step-through pattern;Decreased stride length Gait velocity: decr   General Gait Details: verbal cues for safe use of RW, slow but steady gait, reports 5/10 pain  Stairs            Wheelchair Mobility    Modified Rankin (Stroke Patients Only)       Balance Overall balance assessment: Needs  assistance         Standing balance support: No upper extremity supported Standing balance-Leahy Scale: Fair Standing balance comment: static standing fair however utilized RW for more UE support during ambulation                             Pertinent Vitals/Pain Pain Assessment: 0-10 Pain Score: 5  Pain Location: abdomen Pain Descriptors / Indicators: Discomfort;Sore Pain Intervention(s): Limited activity within patient's tolerance;Repositioned;PCA encouraged;Monitored during session    [place] expects to be discharged to:: Private residence Living Arrangements: Alone Available Help at Discharge: Friend(s);Family;Available PRN/intermittently Type of Home: House       Home Layout: One level Home Equipment: None      Prior Function Level of Independence: Independent               Hand Dominance        Extremity/Trunk Assessment        Lower Extremity Assessment Lower Extremity Assessment: Generalized weakness       Communication   Communication: No difficulties  Cognition Arousal/Alertness: Awake/alert Behavior During Therapy: WFL for tasks assessed/performed Overall Cognitive Status: Within Functional Limits for tasks assessed                                        General Comments      Exercises     Assessment/Plan    PT  Assessment Patient needs continued PT services  PT Problem List Decreased strength;Decreased mobility;Decreased activity tolerance;Decreased knowledge of use of DME       PT Treatment Interventions Gait training;Therapeutic exercise;Patient/family education;DME instruction;Therapeutic activities;Balance training;Stair training;Functional mobility training    PT Goals (Current goals can be found in the Care Plan section)  Acute Rehab PT Goals PT Goal Formulation: With patient Time For Goal Achievement: 10/03/17 Potential to Achieve Goals: Good    Frequency Min 3X/week    Barriers to discharge        Co-evaluation               AM-PAC PT "6 Clicks" Daily Activity  Outcome Measure Difficulty turning over in bed (including adjusting bedclothes, sheets and blankets)?: A Little Difficulty moving from lying on back to sitting on the side of the bed? : Unable Difficulty sitting down on and standing up from a chair with arms (e.g., wheelchair, bedside commode, etc,.)?: Unable Help needed moving to and from a bed to chair (including a wheelchair)?: A Little Help needed walking in hospital room?: A Little Help needed climbing 3-5 steps with a railing? : A Lot 6 Click Score: 13    End of Session   Activity Tolerance: Patient tolerated treatment well Patient left: in chair;with family/visitor present;with call bell/phone within reach   PT Visit Diagnosis: Difficulty in walking, not elsewhere classified (R26.2)    Time: 1010-1028 PT Time Calculation (min) (ACUTE ONLY): 18 min   Charges:   PT Evaluation $PT Eval Low Complexity: 1 Low     PT G CodesCarmelia Bake, PT, DPT 09/19/2017 Pager: [phone]  York Ram E 09/19/2017, 1:19 PM

## 2017-09-19 NOTE — Progress Notes (Signed)
Subjective No acute events. Feeling well. Expected soreness. No n/v. No flatus/BM yet. Has gotten up x1  Objective: Vital signs in last 24 hours: Temp:  [97.9 F (36.6 C)-98.7 F (37.1 C)] 97.9 F (36.6 C) (01/11 0601) Pulse Rate:  [67-91] 67 (01/11 0601) Resp:  [15-25] 18 (01/11 0601) BP: (119-171)/(60-97) 127/63 (01/11 0601) SpO2:  [95 %-100 %] 98 % (01/11 0601) Last BM Date: 09/18/17  Intake/Output from previous day: 01/10 0701 - 01/11 0700 In: 4662.1 [P.O.:60; I.V.:4602.1] Out: 1460 [Urine:1360; Blood:100] Intake/Output this shift: No intake/output data recorded.  Gen: NAD, comfortable CV: RRR Pulm: Normal work of breathing Abd: Obese, soft, NT; no r/g. Incisions c/d/i without erythema or drainage Ext: SCDs in place  Lab Results: CBC  Recent Labs    09/19/17 0525  WBC 13.1*  HGB 11.8*  HCT 35.7*  PLT 306   BMET Recent Labs    09/19/17 0525  NA 139  K 4.1  CL 105  CO2 30  GLUCOSE 127*  BUN 9  CREATININE 0.60  CALCIUM 9.4   Anti-infectives: Anti-infectives (From admission, onward)   Start     Dose/Rate Route Frequency Ordered Stop   09/18/17 0808  cefoTEtan in Dextrose 5% (CEFOTAN) IVPB 2 g     2 g Intravenous On call to O.R. 09/18/17 0808 09/18/17 1023   09/18/17 0808  neomycin (MYCIFRADIN) tablet 1,000 mg  Status:  Discontinued     1,000 mg Oral 3 times per day 09/18/17 0808 09/18/17 0809   09/18/17 0808  metroNIDAZOLE (FLAGYL) tablet 1,000 mg  Status:  Discontinued     1,000 mg Oral 3 times per day 09/18/17 5456 09/18/17 0810       Assessment/Plan: Patient Active Problem List   Diagnosis Date Noted  . Colon polyp 09/18/2017  . Morbid obesity ([place]) 03/08/2014    Class: History of   s/p Procedure(s): LAPAROSCOPIC converted to open RIGHT HEMI COLECTOMY with adhesiolysis ERAS PATHWAY COLONOSCOPY 09/18/2017  -Ice chips, PO meds; anticipate postop ileus 2/2 the adhesiolysis -Entereg until tolerating diet/return of bowel fxn -Replace  electrolytes -Ambulate 5x/day around unit -IS 10x/hr while awake -PCA for pain control until tolerating diet -D/C foley; decrease IVF to 75/hr -PPx: SQH, SCDs   LOS: 1 day   Sharon Mt. Dema Severin, M.D. General and Colorectal Surgery [facility]

## 2017-09-19 NOTE — Evaluation (Signed)
Occupational Therapy Evaluation Patient Details Name: Sharon Allison MRN: [id number] DOB: 08/30/1956 Today's Date: 09/19/2017    History of Present Illness Pt is a 62 year old female s/p LAPAROSCOPIC converted to open RIGHT HEMI COLECTOMY with adhesiolysis ERAS PATHWAY COLONOSCOPY 09/18/2017   Clinical Impression   Pt was admitted for the above.  She is independent with adls and lives alone at baseline. She needs min guard for mobility, min A for bed mobility and ADLs at this time. Goals are for mod I to independent    Follow Up Recommendations  Home health OT    Equipment Recommendations  None recommended by OT    Recommendations for Other Services       Precautions / Restrictions Precautions Precautions: Fall Restrictions Weight Bearing Restrictions: No      Mobility Bed Mobility Overal bed mobility: Needs Assistance Bed Mobility: Supine to Sit     Supine to sit: Min assist Sit to supine: Min assist   General bed mobility comments: from sit to sidelying  Transfers Overall transfer level: Needs assistance Equipment used: Rolling walker (2 wheeled) Transfers: Sit to/from Stand Sit to Stand: Min guard         General transfer comment: for safety    Balance Overall balance assessment: Needs assistance         Standing balance support: No upper extremity supported Standing balance-Leahy Scale: Fair Standing balance comment: static standing fair however utilized RW for more UE support during ambulation                           ADL either performed or assessed with clinical judgement   ADL Overall ADL's : Needs assistance/impaired Eating/Feeding: Independent   Grooming: Supervision/safety;Wash/dry hands;Standing   Upper Body Bathing: Set up;Sitting   Lower Body Bathing: Minimal assistance;Sit to/from stand   Upper Body Dressing : Minimal assistance;Sitting(lines)   Lower Body Dressing: Minimal assistance;Sit to/from stand   Toilet  Transfer: Min guard;Ambulation;BSC;RW;Supervision/safety   Toileting- Water quality scientist and Hygiene: Min guard;Sit to/from stand         General ADL Comments: completed adl standing at sink.  Fatiqued and needed min A to complete.  Pt is able to cross legs comfortably for adls.  Ambulated to nurses station, used toilet and returned to bed     Vision         Perception     Praxis      Pertinent Vitals/Pain Pain Assessment: 0-10 Pain Score: 7  Pain Location: abdomen Pain Descriptors / Indicators: Discomfort;Sore Pain Intervention(s): Limited activity within patient's tolerance;Monitored during session;Repositioned;PCA encouraged     Hand Dominance     Extremity/Trunk Assessment Upper Extremity Assessment Upper Extremity Assessment: Overall WFL for tasks assessed          Communication Communication Communication: No difficulties   Cognition Arousal/Alertness: Awake/alert Behavior During Therapy: WFL for tasks assessed/performed Overall Cognitive Status: Within Functional Limits for tasks assessed                                     General Comments       Exercises     Shoulder Instructions      Home Living Family/patient expects to be discharged to:: Private residence Living Arrangements: Alone Available Help at Discharge: Friend(s);Family;Available PRN/intermittently Type of Home: House       Home Layout: One level  Bathroom Shower/Tub: Teacher, early years/pre: Handicapped height     Home Equipment: None   Additional Comments: pt states she will have very little help; she doesn't like to inconvenience anyone      Prior Functioning/Environment Level of Independence: Independent                 OT Problem List: Decreased activity tolerance;Decreased knowledge of use of DME or AE;Pain      OT Treatment/Interventions: Self-care/ADL training;DME and/or AE instruction;Balance training    OT Goals(Current  goals can be found in the care plan section) Acute Rehab OT Goals Patient Stated Goal: return to independence OT Goal Formulation: With patient Time For Goal Achievement: 10/03/17 Potential to Achieve Goals: Good ADL Goals Pt Will Transfer to Toilet: with modified independence;ambulating;regular height toilet Pt Will Perform Tub/Shower Transfer: Tub transfer;with modified independence Additional ADL Goal #1: pt will gather clothes and complete adl at independent level, using AD as needed  OT Frequency: Min 2X/week   Barriers to D/C:            Co-evaluation              AM-PAC PT "6 Clicks" Daily Activity     Outcome Measure Help from another person eating meals?: None Help from another person taking care of personal grooming?: A Little Help from another person toileting, which includes using toliet, bedpan, or urinal?: A Little Help from another person bathing (including washing, rinsing, drying)?: A Little Help from another person to put on and taking off regular upper body clothing?: A Little Help from another person to put on and taking off regular lower body clothing?: A Little 6 Click Score: 19   End of Session    Activity Tolerance: Patient tolerated treatment well Patient left: in bed;with call bell/phone within reach;with family/visitor present  OT Visit Diagnosis: Muscle weakness (generalized) (M62.81)                Time: 0350-0938 OT Time Calculation (min): 33 min Charges:  OT General Charges $OT Visit: 1 Visit OT Evaluation $OT Eval Low Complexity: 1 Low OT Treatments $Self Care/Home Management : 8-22 mins G-Codes:     [name], OTR/L [phone] 09/19/2017  Sharon Allison 09/19/2017, 3:22 PM

## 2017-09-20 LAB — CBC
HCT: 31.8 % — ABNORMAL LOW (ref 36.0–46.0)
Hemoglobin: 10.4 g/dL — ABNORMAL LOW (ref 12.0–15.0)
MCH: 30.4 pg (ref 26.0–34.0)
MCHC: 32.7 g/dL (ref 30.0–36.0)
MCV: 93 fL (ref 78.0–100.0)
PLATELETS: 271 10*3/uL (ref 150–400)
RBC: 3.42 MIL/uL — ABNORMAL LOW (ref 3.87–5.11)
RDW: 13.7 % (ref 11.5–15.5)
WBC: 11.2 10*3/uL — AB (ref 4.0–10.5)

## 2017-09-20 LAB — PHOSPHORUS: PHOSPHORUS: 1.9 mg/dL — AB (ref 2.5–4.6)

## 2017-09-20 LAB — BASIC METABOLIC PANEL
Anion gap: 5 (ref 5–15)
BUN: 9 mg/dL (ref 6–20)
CO2: 26 mmol/L (ref 22–32)
Calcium: 9 mg/dL (ref 8.9–10.3)
Chloride: 104 mmol/L (ref 101–111)
Creatinine, Ser: 0.7 mg/dL (ref 0.44–1.00)
Glucose, Bld: 80 mg/dL (ref 65–99)
Potassium: 3.8 mmol/L (ref 3.5–5.1)
SODIUM: 135 mmol/L (ref 135–145)

## 2017-09-20 LAB — MAGNESIUM: MAGNESIUM: 2.2 mg/dL (ref 1.7–2.4)

## 2017-09-20 NOTE — Progress Notes (Signed)
  General Surgery [facility]  Assessment & Plan: POD#2 - s/p Procedure(s): LAPAROSCOPIC converted to open RIGHT HEMI COLECTOMY with adhesiolysis ERAS PATHWAY COLONOSCOPY 09/18/2017  -Ice chips, PO meds; anticipate postop ileus due to the adhesiolysis; await resolution of ileus -Entereg until tolerating diet/return of bowel fxn -Replace electrolytes -Ambulate 5x/day around unit -IS 10x/hr while awake -PCA for pain control until tolerating diet -PPx: SQH, SCDs        Earnstine Regal, MD, [facility]       Office: [phone]    Chief Complaint: Right colectomy for polyp  Subjective: Patient in bed, comfortable.  Ambulatory.  Has heard "rumbling".  Does not want to advance diet until passing flatus.  Objective: Vital signs in last 24 hours: Temp:  [97.8 F (36.6 C)-98.9 F (37.2 C)] 98.7 F (37.1 C) (01/12 0426) Pulse Rate:  [66-79] 72 (01/12 0426) Resp:  [14-22] 22 (01/12 0834) BP: (118-148)/(67-87) 118/67 (01/12 0426) SpO2:  [94 %-100 %] 98 % (01/12 0834) Last BM Date: 09/18/17  Intake/Output from previous day: 01/11 0701 - 01/12 0700 In: 1664.6 [P.O.:30; I.V.:1634.6] Out: 800 [Urine:800] Intake/Output this shift: No intake/output data recorded.  Physical Exam: HEENT - sclerae clear, mucous membranes moist Neck - soft Chest - clear bilaterally Cor - RRR Abdomen - soft, obese; rare BS present; wounds dry and intact Ext - no edema, non-tender Neuro - alert & oriented, no focal deficits  Lab Results:  Recent Labs    09/19/17 0525 09/20/17 0506  WBC 13.1* 11.2*  HGB 11.8* 10.4*  HCT 35.7* 31.8*  PLT 306 271   BMET Recent Labs    09/19/17 0525 09/20/17 0506  NA 139 135  K 4.1 3.8  CL 105 104  CO2 30 26  GLUCOSE 127* 80  BUN 9 9  CREATININE 0.60 0.70  CALCIUM 9.4 9.0   PT/INR No results for input(s): LABPROT, INR in the last 72 hours. Comprehensive Metabolic Panel:    Component Value  Date/Time   NA 135 09/20/2017 0506   NA 139 09/19/2017 0525   NA 138 04/04/2017 1009   K 3.8 09/20/2017 0506   K 4.1 09/19/2017 0525   CL 104 09/20/2017 0506   CL 105 09/19/2017 0525   CO2 26 09/20/2017 0506   CO2 30 09/19/2017 0525   BUN 9 09/20/2017 0506   BUN 9 09/19/2017 0525   BUN 13 04/04/2017 1009   CREATININE 0.70 09/20/2017 0506   CREATININE 0.60 09/19/2017 0525   CREATININE 0.62 03/29/2016 1602   CREATININE 0.60 03/29/2015 1640   GLUCOSE 80 09/20/2017 0506   GLUCOSE 127 (H) 09/19/2017 0525   CALCIUM 9.0 09/20/2017 0506   CALCIUM 9.4 09/19/2017 0525   AST 25 09/15/2017 1019   AST 22 04/04/2017 1009   ALT 20 09/15/2017 1019   ALT 20 04/04/2017 1009   ALKPHOS 94 09/15/2017 1019   ALKPHOS 83 04/04/2017 1009   BILITOT 0.6 09/15/2017 1019   BILITOT 0.6 04/04/2017 1009   BILITOT 0.4 03/29/2016 1602   PROT 7.0 09/15/2017 1019   PROT 6.7 04/04/2017 1009   PROT 6.8 03/29/2016 1602   ALBUMIN 3.9 09/15/2017 1019   ALBUMIN 4.3 04/04/2017 1009   ALBUMIN 4.1 03/29/2016 1602    Studies/Results: No results found.    Sharon Allison M 09/20/2017  Patient ID: Sharon Allison, female   DOB: 1956-08-06, 62 y.o.   MRN: [id number]

## 2017-09-20 NOTE — Care Management Note (Signed)
62 yo F, s/p LAPAROSCOPIC converted to open right hemi colectomy with adhesiolysis ERAS PATHWAY COLONOSCOPY 09/18/2017  PT is recommending HHPT and a RW.   Met with pt at bedside. Pt lives alone. She plans to return home with the support of a friend, nephew, his wife and his daughter. She reports that her nephew lives nearby.   She stated that she may not need a RW or HHPT. She used the RW yesterday, but today she walked without any DME.She stated that she doesn't think she needs HHPT.  Explained to pt and we will continue to f/u to assist with the D/C plan and she agreed.

## 2017-09-21 LAB — CBC
HEMATOCRIT: 31.6 % — AB (ref 36.0–46.0)
HEMOGLOBIN: 10.3 g/dL — AB (ref 12.0–15.0)
MCH: 30 pg (ref 26.0–34.0)
MCHC: 32.6 g/dL (ref 30.0–36.0)
MCV: 92.1 fL (ref 78.0–100.0)
Platelets: 275 10*3/uL (ref 150–400)
RBC: 3.43 MIL/uL — AB (ref 3.87–5.11)
RDW: 13.1 % (ref 11.5–15.5)
WBC: 8.4 10*3/uL (ref 4.0–10.5)

## 2017-09-21 LAB — BASIC METABOLIC PANEL
ANION GAP: 6 (ref 5–15)
BUN: 7 mg/dL (ref 6–20)
CO2: 27 mmol/L (ref 22–32)
Calcium: 8.9 mg/dL (ref 8.9–10.3)
Chloride: 105 mmol/L (ref 101–111)
Creatinine, Ser: 0.55 mg/dL (ref 0.44–1.00)
GFR calc non Af Amer: >60 mL/min (ref >60)
GLUCOSE: 70 mg/dL (ref 65–99)
POTASSIUM: 3.4 mmol/L — AB (ref 3.5–5.1)
Sodium: 138 mmol/L (ref 135–145)

## 2017-09-21 LAB — MAGNESIUM: Magnesium: 2 mg/dL (ref 1.7–2.4)

## 2017-09-21 LAB — PHOSPHORUS: PHOSPHORUS: 1.8 mg/dL — AB (ref 2.5–4.6)

## 2017-09-21 MED ORDER — TRAMADOL HCL 50 MG PO TABS
50.0000 mg | ORAL_TABLET | Freq: Four times a day (QID) | ORAL | Status: DC | PRN
  Administered 2017-09-21: 50 mg via ORAL
  Administered 2017-09-22 – 2017-09-23 (×2): 100 mg via ORAL
  Filled 2017-09-21: qty 2
  Filled 2017-09-21: qty 1
  Filled 2017-09-21: qty 2

## 2017-09-21 NOTE — Progress Notes (Signed)
  General Surgery [facility]  Assessment & Plan: POD#3 - status post LAPAROSCOPICconverted to openRIGHT HEMI COLECTOMYwith adhesiolysisERAS PATHWAY COLONOSCOPY 09/18/2017  - onset BM's, advance to clear liquid diet this AM - Entereg discontinued - discontinue PCA - try oral Tramadol - encouraged ambulation, IS use        Earnstine Regal, MD, [facility]       Office: [phone]    Chief Complaint: Right colectomy for polyp  Subjective: Patient in bed, comfortable.  Ambulated yesterday.  Loose BM's.  Denies nausea or emesis.  Objective: Vital signs in last 24 hours: Temp:  [98.2 F (36.8 C)-99.1 F (37.3 C)] 98.6 F (37 C) (01/13 0534) Pulse Rate:  [62-77] 68 (01/13 0534) Resp:  [15-22] 20 (01/13 0534) BP: (128-149)/(64-78) 140/65 (01/13 0534) SpO2:  [95 %-98 %] 98 % (01/13 0534) Weight:  [109.3 kg (241 lb)] 109.3 kg (241 lb) (01/12 1016) Last BM Date: 09/18/17  Intake/Output from previous day: 01/12 0701 - 01/13 0700 In: 1200 [I.V.:1200] Out: 1802 [Urine:1800; Stool:2] Intake/Output this shift: No intake/output data recorded.  Physical Exam: HEENT - sclerae clear, mucous membranes moist Neck - soft Chest - clear bilaterally Cor - RRR Abdomen - soft, obese; midline wound dry and intact; dressings dry Ext - no edema, non-tender Neuro - alert & oriented, no focal deficits  Lab Results:  Recent Labs    09/20/17 0506 09/21/17 0425  WBC 11.2* 8.4  HGB 10.4* 10.3*  HCT 31.8* 31.6*  PLT 271 275   BMET Recent Labs    09/20/17 0506 09/21/17 0425  NA 135 138  K 3.8 3.4*  CL 104 105  CO2 26 27  GLUCOSE 80 70  BUN 9 7  CREATININE 0.70 0.55  CALCIUM 9.0 8.9   PT/INR No results for input(s): LABPROT, INR in the last 72 hours. Comprehensive Metabolic Panel:    Component Value Date/Time   NA 138 09/21/2017 0425   NA 135 09/20/2017 0506   NA 138 04/04/2017 1009   K 3.4 (L) 09/21/2017 0425   K  3.8 09/20/2017 0506   CL 105 09/21/2017 0425   CL 104 09/20/2017 0506   CO2 27 09/21/2017 0425   CO2 26 09/20/2017 0506   BUN 7 09/21/2017 0425   BUN 9 09/20/2017 0506   BUN 13 04/04/2017 1009   CREATININE 0.55 09/21/2017 0425   CREATININE 0.70 09/20/2017 0506   CREATININE 0.62 03/29/2016 1602   CREATININE 0.60 03/29/2015 1640   GLUCOSE 70 09/21/2017 0425   GLUCOSE 80 09/20/2017 0506   CALCIUM 8.9 09/21/2017 0425   CALCIUM 9.0 09/20/2017 0506   AST 25 09/15/2017 1019   AST 22 04/04/2017 1009   ALT 20 09/15/2017 1019   ALT 20 04/04/2017 1009   ALKPHOS 94 09/15/2017 1019   ALKPHOS 83 04/04/2017 1009   BILITOT 0.6 09/15/2017 1019   BILITOT 0.6 04/04/2017 1009   BILITOT 0.4 03/29/2016 1602   PROT 7.0 09/15/2017 1019   PROT 6.7 04/04/2017 1009   PROT 6.8 03/29/2016 1602   ALBUMIN 3.9 09/15/2017 1019   ALBUMIN 4.3 04/04/2017 1009   ALBUMIN 4.1 03/29/2016 1602    Studies/Results: No results found.    Sharon Allison M 09/21/2017  Patient ID: Sharon Allison, female   DOB: [birth year], 63 y.o.   MRN: [id number]

## 2017-09-22 LAB — CBC
HEMATOCRIT: 32 % — AB (ref 36.0–46.0)
Hemoglobin: 10.7 g/dL — ABNORMAL LOW (ref 12.0–15.0)
MCH: 30.2 pg (ref 26.0–34.0)
MCHC: 33.4 g/dL (ref 30.0–36.0)
MCV: 90.4 fL (ref 78.0–100.0)
PLATELETS: 287 10*3/uL (ref 150–400)
RBC: 3.54 MIL/uL — AB (ref 3.87–5.11)
RDW: 13 % (ref 11.5–15.5)
WBC: 8.1 10*3/uL (ref 4.0–10.5)

## 2017-09-22 LAB — BASIC METABOLIC PANEL
ANION GAP: 4 — AB (ref 5–15)
BUN: <5 mg/dL — ABNORMAL LOW (ref 6–20)
CALCIUM: 9.3 mg/dL (ref 8.9–10.3)
CO2: 28 mmol/L (ref 22–32)
Chloride: 108 mmol/L (ref 101–111)
Creatinine, Ser: 0.5 mg/dL (ref 0.44–1.00)
Glucose, Bld: 94 mg/dL (ref 65–99)
POTASSIUM: 3.4 mmol/L — AB (ref 3.5–5.1)
Sodium: 140 mmol/L (ref 135–145)

## 2017-09-22 LAB — MAGNESIUM: Magnesium: 1.9 mg/dL (ref 1.7–2.4)

## 2017-09-22 LAB — PHOSPHORUS: PHOSPHORUS: 2.6 mg/dL (ref 2.5–4.6)

## 2017-09-22 MED ORDER — POTASSIUM CHLORIDE CRYS ER 20 MEQ PO TBCR
20.0000 meq | EXTENDED_RELEASE_TABLET | Freq: Once | ORAL | Status: AC
  Administered 2017-09-22: 20 meq via ORAL
  Filled 2017-09-22: qty 1

## 2017-09-22 NOTE — Progress Notes (Signed)
Occupational Therapy Treatment Patient Details Name: Sharon Allison MRN: [id number] DOB: [birth year] Today's Date: 09/22/2017    History of present illness Pt is a 62 year old female s/p LAPAROSCOPIC converted to open RIGHT HEMI COLECTOMY with adhesiolysis ERAS PATHWAY COLONOSCOPY 09/18/2017   OT comments  Pt would benefit from a reacher , as well as a bath aid initially. Pt very concerned about incision.  Pt inquiring about [facility] RN.   Follow Up Recommendations  Home health OT    Equipment Recommendations  None recommended by OT    Recommendations for Other Services      Precautions / Restrictions Precautions Precautions: None Restrictions Weight Bearing Restrictions: No       Mobility Bed Mobility Overal bed mobility: Modified Independent Bed Mobility: Supine to Sit     Supine to sit: Modified independent (Device/Increase time) Sit to supine: Modified independent (Device/Increase time)      Transfers Overall transfer level: Modified independent Equipment used: None Transfers: Sit to/from [employer] to Stand: Modified independent (Device/Increase time) Stand pivot transfers: Supervision       General transfer comment: increased time from abdominal pain    Balance Overall balance assessment: No apparent balance deficits (not formally assessed)                                         ADL either performed or assessed with clinical judgement   ADL Overall ADL's : Needs assistance/impaired Eating/Feeding: Independent           Lower Body Bathing: Supervison/ safety;Sit to/from stand;Cueing for safety;Cueing for sequencing   Upper Body Dressing : Set up;Sitting   Lower Body Dressing: Supervision/safety;Sit to/from stand;Cueing for safety;Cueing for sequencing   Toilet Transfer: Supervision/safety;Comfort height toilet;Ambulation   Toileting- Clothing Manipulation and Hygiene: Supervision/safety;Sit to/from stand          General ADL Comments: Pt agreed to [place].  Discussed need for loose fitting clothes due to incision as well as a reacher to obtain items off floor      Vision Patient Visual Report: No change from baseline            Cognition Arousal/Alertness: Awake/alert Behavior During Therapy: WFL for tasks assessed/performed Overall Cognitive Status: Within Functional Limits for tasks assessed                                                     Pertinent Vitals/ Pain       Pain Assessment: 0-10 Pain Score: 4  Pain Location: abdomen, incision sites Pain Descriptors / Indicators: Sore Pain Intervention(s): Limited activity within patient's tolerance     Prior Functioning/Environment              Frequency  Min 2X/week        Progress Toward Goals  OT Goals(current goals can now be found in the care plan section)  Progress towards OT goals: Progressing toward goals     Plan Discharge plan remains appropriate       AM-PAC PT "6 Clicks" Daily Activity     Outcome Measure   Help from another person eating meals?: None Help from another person taking care of personal grooming?: A Little Help from another person toileting, which includes  using toliet, bedpan, or urinal?: A Little Help from another person bathing (including washing, rinsing, drying)?: A Little Help from another person to put on and taking off regular upper body clothing?: A Little Help from another person to put on and taking off regular lower body clothing?: A Little 6 Click Score: 19    End of Session    OT Visit Diagnosis: Muscle weakness (generalized) (M62.81)   Activity Tolerance Patient tolerated treatment well   Patient Left in bed;with call bell/phone within reach;with family/visitor present   Nurse Communication          Time: 6606-0045 OT Time Calculation (min): 15 min  Charges: OT General Charges $OT Visit: 1 Visit OT Treatments $Self Care/Home Management  : 8-22 mins  [place], [place] [phone]   Betsy Pries 09/22/2017, 2:15 PM

## 2017-09-22 NOTE — Progress Notes (Signed)
Subjective No acute events. Feeling well. Tolerating clears without n/v; some bloating. Having BMs  Objective: Vital signs in last 24 hours: Temp:  [97.7 F (36.5 C)-99.1 F (37.3 C)] 99.1 F (37.3 C) (01/14 0646) Pulse Rate:  [64-78] 64 (01/14 0646) Resp:  [16-18] 16 (01/14 0646) BP: (136-150)/(65-68) 150/65 (01/14 0646) SpO2:  [95 %-97 %] 97 % (01/14 0646) Last BM Date: 09/18/17  Intake/Output from previous day: 01/13 0701 - 01/14 0700 In: 2618.9 [P.O.:1080; I.V.:1538.9] Out: 2900 [Urine:2900] Intake/Output this shift: No intake/output data recorded.  Gen: NAD, comfortable CV: RRR Pulm: Normal work of breathing Abd: Obese, soft, NT; no r/g. Incisions c/d/i without erythema or drainage Ext: SCDs in place  Lab Results: CBC  Recent Labs    09/21/17 0425 09/22/17 0434  WBC 8.4 8.1  HGB 10.3* 10.7*  HCT 31.6* 32.0*  PLT 275 287   BMET Recent Labs    09/21/17 0425 09/22/17 0434  NA 138 140  K 3.4* 3.4*  CL 105 108  CO2 27 28  GLUCOSE 70 94  BUN 7 <5*  CREATININE 0.55 0.50  CALCIUM 8.9 9.3   Anti-infectives: Anti-infectives (From admission, onward)   Start     Dose/Rate Route Frequency Ordered Stop   09/18/17 0808  cefoTEtan in Dextrose 5% (CEFOTAN) IVPB 2 g     2 g Intravenous On call to O.R. 09/18/17 0808 09/18/17 1023   09/18/17 0808  neomycin (MYCIFRADIN) tablet 1,000 mg  Status:  Discontinued     1,000 mg Oral 3 times per day 09/18/17 0808 09/18/17 0809   09/18/17 0808  metroNIDAZOLE (FLAGYL) tablet 1,000 mg  Status:  Discontinued     1,000 mg Oral 3 times per day 09/18/17 2683 09/18/17 0810       Assessment/Plan: Patient Active Problem List   Diagnosis Date Noted  . Colon polyp 09/18/2017  . Morbid obesity ([place]) 03/08/2014    Class: History of   s/p Procedure(s): LAPAROSCOPIC converted to open RIGHT HEMI COLECTOMY with adhesiolysis ERAS PATHWAY COLONOSCOPY 09/18/2017  -Advance to GI soft diet -D/C entereg & IVF -Replace  electrolytes -Ambulate 5x/day around unit -IS 10x/hr while awake -PPx: SQH, SCDs -Still bloated but having BMs - possible d/c tomorrow if tolerating diet and pain controlled   LOS: 4 days   Sharon Mt. Dema Severin, M.D. General and Colorectal Surgery [facility]

## 2017-09-22 NOTE — Progress Notes (Signed)
Physical Therapy Treatment/Discharge Patient Details Name: JANISHA BUESO MRN: [id number] DOB: 23-Nov-1955 Today's Date: 09/22/2017    History of Present Illness Pt is a 62 year old female s/p LAPAROSCOPIC converted to open RIGHT HEMI COLECTOMY with adhesiolysis ERAS PATHWAY COLONOSCOPY 09/18/2017    PT Comments    Pt reports more abdominal pain today, has been weaned from PCA. Reports that she has been walking 3 laps around floor without RW, appears to be steady, will continue ambulating on her own while in acute. Pt is considering following up with HHPT. Pt had no further questions for PT. Will discharge from acute PT.   Follow Up Recommendations  Home health PT     Equipment Recommendations  None recommended by PT    Recommendations for Other Services       Precautions / Restrictions Precautions Precautions: None Restrictions Weight Bearing Restrictions: No    Mobility  Bed Mobility Overal bed mobility: Modified Independent Bed Mobility: Supine to Sit     Supine to sit: Modified independent (Device/Increase time) Sit to supine: Modified independent (Device/Increase time)      Transfers Overall transfer level: Modified independent Equipment used: None Transfers: Sit to/from Stand Sit to Stand: Modified independent (Device/Increase time)         General transfer comment: increased time from abdominal pain  Ambulation/Gait Ambulation/Gait assistance: Modified independent (Device/Increase time);Supervision Ambulation Distance (Feet): 400 Feet Assistive device: None Gait Pattern/deviations: Step-through pattern;Decreased stride length     General Gait Details: slow but steady gait, reports no increase in pain with ambulation, pushed IV pole   Stairs            Wheelchair Mobility    Modified Rankin (Stroke Patients Only)       Balance Overall balance assessment: No apparent balance deficits (not formally assessed)                                           Cognition Arousal/Alertness: Awake/alert Behavior During Therapy: WFL for tasks assessed/performed Overall Cognitive Status: Within Functional Limits for tasks assessed                                        Exercises      General Comments        Pertinent Vitals/Pain Pain Assessment: 0-10 Pain Score: 8  Pain Location: abdomen, incision sites Pain Descriptors / Indicators: Aching;Discomfort;Sore Pain Intervention(s): Limited activity within patient's tolerance;Repositioned;Premedicated before session;Monitored during session    Home Living                      Prior Function            PT Goals (current goals can now be found in the care plan section) Progress towards PT goals: Goals met/education completed, patient discharged from PT    Frequency    Min 3X/week      PT Plan Current plan remains appropriate    Co-evaluation              AM-PAC PT "6 Clicks" Daily Activity  Outcome Measure  Difficulty turning over in bed (including adjusting bedclothes, sheets and blankets)?: A Little Difficulty moving from lying on back to sitting on the side of the bed? : A Little Difficulty sitting down on  and standing up from a chair with arms (e.g., wheelchair, bedside commode, etc,.)?: A Little Help needed moving to and from a bed to chair (including a wheelchair)?: A Little Help needed walking in hospital room?: A Little Help needed climbing 3-5 steps with a railing? : A Little 6 Click Score: 18    End of Session   Activity Tolerance: Patient tolerated treatment well Patient left: in bed;with call bell/phone within reach   PT Visit Diagnosis: Difficulty in walking, not elsewhere classified (R26.2)     Time: 2174-7159 PT Time Calculation (min) (ACUTE ONLY): 16 min  Charges:  $Gait Training: 8-22 mins                    G Codes:       [place] Hezzie Karim, SPT   [place] Robinette Esters 09/22/2017, 1:51 PM

## 2017-09-23 LAB — BASIC METABOLIC PANEL
Anion gap: 8 (ref 5–15)
BUN: 10 mg/dL (ref 6–20)
CALCIUM: 9.6 mg/dL (ref 8.9–10.3)
CHLORIDE: 103 mmol/L (ref 101–111)
CO2: 28 mmol/L (ref 22–32)
CREATININE: 0.63 mg/dL (ref 0.44–1.00)
GFR calc non Af Amer: >60 mL/min (ref >60)
GLUCOSE: 115 mg/dL — AB (ref 65–99)
Potassium: 3.4 mmol/L — ABNORMAL LOW (ref 3.5–5.1)
Sodium: 139 mmol/L (ref 135–145)

## 2017-09-23 LAB — CBC
HEMATOCRIT: 33 % — AB (ref 36.0–46.0)
HEMOGLOBIN: 10.9 g/dL — AB (ref 12.0–15.0)
MCH: 30 pg (ref 26.0–34.0)
MCHC: 33 g/dL (ref 30.0–36.0)
MCV: 90.9 fL (ref 78.0–100.0)
Platelets: 330 10*3/uL (ref 150–400)
RBC: 3.63 MIL/uL — ABNORMAL LOW (ref 3.87–5.11)
RDW: 13.3 % (ref 11.5–15.5)
WBC: 9 10*3/uL (ref 4.0–10.5)

## 2017-09-23 LAB — PHOSPHORUS: Phosphorus: 4.6 mg/dL (ref 2.5–4.6)

## 2017-09-23 LAB — MAGNESIUM: Magnesium: 1.8 mg/dL (ref 1.7–2.4)

## 2017-09-23 NOTE — Progress Notes (Addendum)
Spoke with patient at bedside. Patient states she ambulates independently and is also independent of ADL's  Her major concern is caring for her incision. She is not sure how to care for it and is worried about it healing. Spoke with her nurse and she also agrees PT/OT not needed but would benefit from nurse for wound check. Contacted attending to update orders. Patient had no preference for [facility] services. Contacted [facility] for referral and they have accepted. No further concerns noted. [phone]

## 2017-09-23 NOTE — Progress Notes (Signed)
Subjective No acute events. Feeling well. Tolerating diet without n/v; some bloating. Having BMs. Requesting home health now as she lives alone and is concerned about ADLs  Objective: Vital signs in last 24 hours: Temp:  [98.2 F (36.8 C)-98.9 F (37.2 C)] 98.9 F (37.2 C) (01/15 0630) Pulse Rate:  [66-81] 70 (01/15 0630) Resp:  [18] 18 (01/15 0630) BP: (149-156)/(74-86) 153/86 (01/15 0630) SpO2:  [94 %-100 %] 100 % (01/15 0630) Last BM Date: 09/22/17  Intake/Output from previous day: 01/14 0701 - 01/15 0700 In: 1942.5 [P.O.:1080; I.V.:862.5] Out: 2202 [Urine:2200; Stool:2] Intake/Output this shift: Total I/O In: -  Out: 400 [Urine:400]  Gen: NAD, comfortable CV: RRR Pulm: Normal work of breathing Abd: Obese, soft, NT; no r/g. Incisions c/d/i without erythema or drainage Ext: SCDs in place  Lab Results: CBC  Recent Labs    09/22/17 0434 09/23/17 0430  WBC 8.1 9.0  HGB 10.7* 10.9*  HCT 32.0* 33.0*  PLT 287 330   BMET Recent Labs    09/22/17 0434 09/23/17 0430  NA 140 139  K 3.4* 3.4*  CL 108 103  CO2 28 28  GLUCOSE 94 115*  BUN <5* 10  CREATININE 0.50 0.63  CALCIUM 9.3 9.6   Anti-infectives: Anti-infectives (From admission, onward)   Start     Dose/Rate Route Frequency Ordered Stop   09/18/17 0808  cefoTEtan in Dextrose 5% (CEFOTAN) IVPB 2 g     2 g Intravenous On call to O.R. 09/18/17 0808 09/18/17 1023   09/18/17 0808  neomycin (MYCIFRADIN) tablet 1,000 mg  Status:  Discontinued     1,000 mg Oral 3 times per day 09/18/17 0808 09/18/17 0809   09/18/17 0808  metroNIDAZOLE (FLAGYL) tablet 1,000 mg  Status:  Discontinued     1,000 mg Oral 3 times per day 09/18/17 3704 09/18/17 0810       Assessment/Plan: Patient Active Problem List   Diagnosis Date Noted  . Colon polyp 09/18/2017  . Morbid obesity ([place]) 03/08/2014    Class: History of   s/p Procedure(s): LAPAROSCOPIC converted to open RIGHT HEMI COLECTOMY with adhesiolysis ERAS  PATHWAY COLONOSCOPY 09/18/2017  -Advance to GI soft diet -D/C entereg & IVF -Replace electrolytes -Ambulate 5x/day around unit -IS 10x/hr while awake -PPx: SQH, SCDs -Home health consult placed today; initially had declined but now interested -Her pathology has returned and this was discussed at length with the patient including the likely need for adjuvant chemotherapy   LOS: 5 days   Sharon Mt. Dema Severin, M.D. General and Colorectal Surgery [facility]

## 2017-09-24 ENCOUNTER — Telehealth: Payer: Self-pay | Admitting: Oncology

## 2017-09-24 DIAGNOSIS — C189 Malignant neoplasm of colon, unspecified: Secondary | ICD-10-CM

## 2017-09-24 LAB — BASIC METABOLIC PANEL
ANION GAP: 8 (ref 5–15)
BUN: 7 mg/dL (ref 6–20)
CALCIUM: 10.3 mg/dL (ref 8.9–10.3)
CO2: 28 mmol/L (ref 22–32)
Chloride: 102 mmol/L (ref 101–111)
Creatinine, Ser: 0.67 mg/dL (ref 0.44–1.00)
GFR calc Af Amer: >60 mL/min (ref >60)
GLUCOSE: 114 mg/dL — AB (ref 65–99)
Potassium: 3.5 mmol/L (ref 3.5–5.1)
Sodium: 138 mmol/L (ref 135–145)

## 2017-09-24 LAB — PHOSPHORUS: Phosphorus: 4 mg/dL (ref 2.5–4.6)

## 2017-09-24 LAB — MAGNESIUM: Magnesium: 1.8 mg/dL (ref 1.7–2.4)

## 2017-09-24 MED ORDER — MAGNESIUM HYDROXIDE 400 MG/5ML PO SUSP
30.0000 mL | Freq: Once | ORAL | Status: AC
  Administered 2017-09-24: 30 mL via ORAL
  Filled 2017-09-24: qty 30

## 2017-09-24 MED ORDER — LACTATED RINGERS IV SOLN
INTRAVENOUS | Status: DC
  Administered 2017-09-24: 22:00:00 via INTRAVENOUS

## 2017-09-24 NOTE — Telephone Encounter (Signed)
Left message for patient regarding D/T/Loc/Ph# for appointment per per referral message from [facility] to be scheduled week of 1/28

## 2017-09-24 NOTE — Progress Notes (Signed)
  Oncology Nurse Navigator Documentation  Navigator Location: [facility] (09/24/17 4210) Referral date to RadOnc/MedOnc: 09/24/17 (09/24/17 3128) )Navigator Encounter Type: Other (09/24/17 1188)                             Interventions: Referrals;Coordination of Care (09/24/17 6773)    Referral to med/onc placed per Dr. Benay Spice.        Acuity: Level 2 (09/24/17 0859)         Time Spent with Patient: 15 (09/24/17 0859)

## 2017-09-24 NOTE — Progress Notes (Signed)
Subjective No acute events. Feeling well. Tolerating diet without n/v; some bloating. Small BM yesterday. Home health in place  Objective: Vital signs in last 24 hours: Temp:  [98.7 F (37.1 C)-99.6 F (37.6 C)] 99.6 F (37.6 C) (01/16 0555) Pulse Rate:  [74-94] 94 (01/16 0555) Resp:  [16-18] 18 (01/16 0555) BP: (147-165)/(78-88) 147/88 (01/16 0555) SpO2:  [95 %-97 %] 96 % (01/16 0555) Last BM Date: 09/22/17  Intake/Output from previous day: 01/15 0701 - 01/16 0700 In: 840 [P.O.:840] Out: 2900 [Urine:2900] Intake/Output this shift: Total I/O In: 240 [P.O.:240] Out: 1000 [Urine:1000]  Gen: NAD, comfortable CV: RRR Pulm: Normal work of breathing Abd: Obese, soft, NT; no r/g. Incisions c/d/i without erythema or drainage Ext: SCDs in place  Lab Results: CBC  Recent Labs    09/22/17 0434 09/23/17 0430  WBC 8.1 9.0  HGB 10.7* 10.9*  HCT 32.0* 33.0*  PLT 287 330   BMET Recent Labs    09/23/17 0430 09/24/17 0458  NA 139 138  K 3.4* 3.5  CL 103 102  CO2 28 28  GLUCOSE 115* 114*  BUN 10 7  CREATININE 0.63 0.67  CALCIUM 9.6 10.3   Anti-infectives: Anti-infectives (From admission, onward)   Start     Dose/Rate Route Frequency Ordered Stop   09/18/17 0808  cefoTEtan in Dextrose 5% (CEFOTAN) IVPB 2 g     2 g Intravenous On call to O.R. 09/18/17 0808 09/18/17 1023   09/18/17 0808  neomycin (MYCIFRADIN) tablet 1,000 mg  Status:  Discontinued     1,000 mg Oral 3 times per day 09/18/17 0808 09/18/17 0809   09/18/17 0808  metroNIDAZOLE (FLAGYL) tablet 1,000 mg  Status:  Discontinued     1,000 mg Oral 3 times per day 09/18/17 0355 09/18/17 0810       Assessment/Plan: Patient Active Problem List   Diagnosis Date Noted  . Colon polyp 09/18/2017  . Morbid obesity ([place]) 03/08/2014    Class: History of   s/p Procedure(s): LAPAROSCOPIC converted to open RIGHT HEMI COLECTOMY with adhesiolysis ERAS PATHWAY COLONOSCOPY 09/18/2017  -Diet as tolerated -Milk of mag this  morning for bloating; if having BMs anticipate discharge this afternoon -Ambulate 5x/day around unit -IS 10x/hr while awake -PPx: SQH, SCDs -Home health -Her pathology has returned and this was discussed at length with the patient including the likely need for adjuvant chemotherapy   LOS: 6 days   Sharon Mt. Dema Severin, M.D. General and Colorectal Surgery [facility]

## 2017-09-24 NOTE — Progress Notes (Signed)
Occupational Therapy Treatment/ Discharge Patient Details Name: Sharon Allison: 04/18/1956 Today's Date: 09/24/2017    History of present illness Pt is a 61 year old female s/p LAPAROSCOPIC converted to open RIGHT HEMI COLECTOMY with adhesiolysis ERAS PATHWAY COLONOSCOPY 09/18/2017   OT comments  Goals met - will DC OT  Follow Up Recommendations  No OT follow up    Equipment Recommendations  None recommended by OT       Precautions / Restrictions         Mobility Bed Mobility Overal bed mobility: Modified Independent Bed Mobility: Supine to Sit     Supine to sit: Modified independent (Device/Increase time)        Transfers Overall transfer level: Modified independent Equipment used: None Transfers: Sit to/from Stand;Stand Pivot Transfers Sit to Stand: Modified independent (Device/Increase time) Stand pivot transfers: Independent            Balance Overall balance assessment: No apparent balance deficits (not formally assessed)                                         ADL either performed or assessed with clinical judgement   ADL Overall ADL's : Needs assistance/impaired Eating/Feeding: Independent   Grooming: Independent;Standing   Upper Body Bathing: Independent;Sitting   Lower Body Bathing: Independent   Upper Body Dressing : Sitting;Independent   Lower Body Dressing: Sit to/from stand;Cueing for sequencing;Independent   Toilet Transfer: Comfort height toilet;Ambulation;Independent   Toileting- Clothing Manipulation and Hygiene: Independent;Sit to/from stand   Tub/ Shower Transfer: Walk-in shower;Independent     General ADL Comments: educated pt on safety with ADL activity , wearing loose clothes, using a reacher for picking up items from floor . Pt feeling much better. OT goals are met- will signo ff     Vision Patient Visual Report: No change from baseline     Perception     Praxis      Cognition  Arousal/Alertness: Awake/alert Behavior During Therapy: WFL for tasks assessed/performed Overall Cognitive Status: Within Functional Limits for tasks assessed                                                     Pertinent Vitals/ Pain       Pain Score: 3  Pain Location: abdomen, incision sites Pain Descriptors / Indicators: Sore Pain Intervention(s): Limited activity within patient's tolerance;Monitored during session     Prior Functioning/Environment                 Progress Toward Goals  OT Goals(current goals can now be found in the care plan section)  Progress towards OT goals: Goals met/education completed, patient discharged from OT     Plan Discharge plan remains appropriate;All goals met and education completed, patient discharged from OT services    Co-evaluation                 AM-PAC PT "6 Clicks" Daily Activity     Outcome Measure   Help from another person eating meals?: None Help from another person taking care of personal grooming?: None Help from another person toileting, which includes using toliet, bedpan, or urinal?: None Help from another person bathing (including washing, rinsing, drying)?: None Help   from another person to put on and taking off regular upper body clothing?: None Help from another person to put on and taking off regular lower body clothing?: None 6 Click Score: 24    End of Session        Activity Tolerance Patient tolerated treatment well   Patient Left in chair;with call bell/phone within reach   Nurse Communication          Time: 1228-1240 OT Time Calculation (min): 12 min  Charges: OT General Charges $OT Visit: 1 Visit OT Treatments $Self Care/Home Management : 8-22 mins  Sharon Allison, OT [phone]   Allison, Sharon Allison    

## 2017-09-25 LAB — CBC WITH DIFFERENTIAL/PLATELET
BASOS ABS: 0 10*3/uL (ref 0.0–0.1)
Basophils Relative: 0 %
Eosinophils Absolute: 0.3 10*3/uL (ref 0.0–0.7)
Eosinophils Relative: 2 %
HEMATOCRIT: 35.1 % — AB (ref 36.0–46.0)
Hemoglobin: 11.6 g/dL — ABNORMAL LOW (ref 12.0–15.0)
LYMPHS ABS: 2.6 10*3/uL (ref 0.7–4.0)
LYMPHS PCT: 21 %
MCH: 30 pg (ref 26.0–34.0)
MCHC: 33 g/dL (ref 30.0–36.0)
MCV: 90.7 fL (ref 78.0–100.0)
Monocytes Absolute: 0.8 10*3/uL (ref 0.1–1.0)
Monocytes Relative: 6 %
NEUTROS ABS: 8.4 10*3/uL — AB (ref 1.7–7.7)
Neutrophils Relative %: 71 %
Platelets: 366 10*3/uL (ref 150–400)
RBC: 3.87 MIL/uL (ref 3.87–5.11)
RDW: 13.7 % (ref 11.5–15.5)
WBC: 12 10*3/uL — ABNORMAL HIGH (ref 4.0–10.5)

## 2017-09-25 LAB — BASIC METABOLIC PANEL
ANION GAP: 8 (ref 5–15)
BUN: 9 mg/dL (ref 6–20)
CALCIUM: 10.1 mg/dL (ref 8.9–10.3)
CO2: 27 mmol/L (ref 22–32)
Chloride: 104 mmol/L (ref 101–111)
Creatinine, Ser: 0.6 mg/dL (ref 0.44–1.00)
GFR calc Af Amer: >60 mL/min (ref >60)
GFR calc non Af Amer: >60 mL/min (ref >60)
GLUCOSE: 103 mg/dL — AB (ref 65–99)
POTASSIUM: 3.6 mmol/L (ref 3.5–5.1)
Sodium: 139 mmol/L (ref 135–145)

## 2017-09-25 MED ORDER — ALUM & MAG HYDROXIDE-SIMETH 200-200-20 MG/5ML PO SUSP
30.0000 mL | ORAL | Status: DC | PRN
  Administered 2017-09-25: 30 mL via ORAL
  Filled 2017-09-25: qty 30

## 2017-09-25 NOTE — Progress Notes (Signed)
Subjective No acute events. Feeling well. Tolerating diet without n/v; had a large BM this morning - bloating much improved.   Objective: Vital signs in last 24 hours: Temp:  [97.6 F (36.4 C)-98.9 F (37.2 C)] 98.9 F (37.2 C) (01/17 0621) Pulse Rate:  [79-89] 87 (01/17 0621) Resp:  [14-18] 18 (01/17 0621) BP: (140-164)/(78-80) 140/78 (01/17 0621) SpO2:  [95 %-98 %] 98 % (01/17 0621) Last BM Date: 09/25/17  Intake/Output from previous day: 01/16 0701 - 01/17 0700 In: 1062.5 [P.O.:720; I.V.:342.5] Out: 2102 [Urine:2100; Stool:2] Intake/Output this shift: No intake/output data recorded.  Gen: NAD, comfortable CV: RRR Pulm: Normal work of breathing Abd: Obese, soft, NT; no r/g. Incisions c/d/i without erythema or drainage Ext: SCDs in place  Lab Results: CBC  Recent Labs    09/23/17 0430 09/25/17 0507  WBC 9.0 12.0*  HGB 10.9* 11.6*  HCT 33.0* 35.1*  PLT 330 366   BMET Recent Labs    09/24/17 0458 09/25/17 0507  NA 138 139  K 3.5 3.6  CL 102 104  CO2 28 27  GLUCOSE 114* 103*  BUN 7 9  CREATININE 0.67 0.60  CALCIUM 10.3 10.1   Anti-infectives: Anti-infectives (From admission, onward)   Start     Dose/Rate Route Frequency Ordered Stop   09/18/17 0808  cefoTEtan in Dextrose 5% (CEFOTAN) IVPB 2 g     2 g Intravenous On call to O.R. 09/18/17 0808 09/18/17 1023   09/18/17 0808  neomycin (MYCIFRADIN) tablet 1,000 mg  Status:  Discontinued     1,000 mg Oral 3 times per day 09/18/17 0808 09/18/17 0809   09/18/17 0808  metroNIDAZOLE (FLAGYL) tablet 1,000 mg  Status:  Discontinued     1,000 mg Oral 3 times per day 09/18/17 7322 09/18/17 0810       Assessment/Plan: Patient Active Problem List   Diagnosis Date Noted  . Colon polyp 09/18/2017  . Morbid obesity ([place]) 03/08/2014    Class: History of   s/p Procedure(s): LAPAROSCOPIC converted to open RIGHT HEMI COLECTOMY with adhesiolysis ERAS PATHWAY COLONOSCOPY 09/18/2017  -WBC bumped to 12, had ileus but  potentially resolving. Monitor. Continue IVF - will reassess later today -Diet as tolerated -Ambulate 5x/day around unit -IS 10x/hr while awake -PPx: SQH, SCDs -Home health   LOS: 7 days   Sharon Mt. Dema Severin, M.D. General and Colorectal Surgery [facility]

## 2017-09-25 NOTE — Progress Notes (Signed)
Feeling much better - has had 3 BMs in past 12hrs and bloating has resolved. Tolerating regular diet without n/v.  AF VS normal Abd soft, NT; wounds c/d/i without erythema   WBC was 12 but ileus has resolved, tolerating diet and has been afebrile with clean wounds. Plan discharge home today  Sharon Mt. Dema Severin, M.D. General and Colorectal Surgery [facility]

## 2017-09-25 NOTE — Discharge Instructions (Signed)
POST OP INSTRUCTIONS  1. DIET: As tolerated. Follow a light bland diet the first 24 hours after arrival home, such as soup, liquids, crackers, etc.  Be sure to include lots of fluids daily.  Avoid fast food or heavy meals as your are more likely to get nauseated.  Eat a low fat the next few days after surgery.  2. Take your usually prescribed home medications unless otherwise directed.  3. PAIN CONTROL: a. Pain is best controlled by a usual combination of three different methods TOGETHER: i. Ice/Heat ii. Over the counter pain medication iii. Prescription pain medication b. Most patients will experience some swelling and bruising around the surgical site.  Ice packs or heating pads (30-60 minutes up to 6 times a day) will help. Some people prefer to use ice alone, heat alone, alternating between ice & heat.  Experiment to what works for you.  Swelling and bruising can take several weeks to resolve.   c. It is helpful to take an over-the-counter pain medication regularly for the first few weeks: i. Ibuprofen (Motrin/Advil) - 200mg  tabs - take 3 tabs (600mg ) every 6 hours as needed for pain ii. Acetaminophen (Tylenol) - you may take 650mg  every 6 hours as needed. You can take this with motrin as they act differently on the body. If you are taking a narcotic pain medication that has acetaminophen in it, do not take over the counter tylenol at the same time.  Iii. NOTE: You may take both of these medications together - most patients  find it most helpful when alternating between the two (i.e. Ibuprofen at 6am,  tylenol at 9am, ibuprofen at 12pm ...) d. A  prescription for pain medication should be given to you upon discharge.  Take your pain medication as prescribed if your pain is not adequatly controlled with the over-the-counter pain reliefs mentioned above.  4. Avoid getting constipated.  Between the surgery and the pain medications, it is common to experience some constipation.  Increasing fluid  intake and taking a fiber supplement (such as Metamucil, Citrucel, FiberCon, MiraLax, etc) 1-2 times a day regularly will usually help prevent this problem from occurring.  A mild laxative (prune juice, Milk of Magnesia, MiraLax, etc) should be taken according to package directions if there are no bowel movements after 48 hours.    5. Dressing: Your wound has staples - its sometimes helpful to cover this when wearing clothing to protect the clothes from any drainage that may occur. Ok to shower normally and get soap/water over wounds. Pat dry following. Avoid baths/pools/lakes/oceans until your wounds have fully healed.  6. ACTIVITIES as tolerated:   a. Avoid heavy lifting (>10lbs or 1 gallon of milk) for the next 6 weeks. b. You may resume regular (light) daily activities beginning the next day--such as daily self-care, walking, climbing stairs--gradually increasing activities as tolerated.  If you can walk 30 minutes without difficulty, it is safe to try more intense activity such as jogging, treadmill, bicycling, low-impact aerobics.  c. DO NOT PUSH THROUGH PAIN.  Let pain be your guide: If it hurts to do something, don't do it. d. Dennis Bast may drive when you are no longer taking prescription pain medication, you can comfortably wear a seatbelt, and you can safely maneuver your car and apply brakes. e. Dennis Bast may have sexual intercourse when it is comfortable.   7. FOLLOW UP in our office a. Please call [facility] at [phone] to set up an appointment to see your surgeon in the  office for a follow-up appointment approximately 2 weeks after your surgery. b. Make sure that you call for this appointment the day you arrive home to insure a convenient appointment time.  9. If you have disability or family leave forms that need to be completed, you may have them completed by your primary care physician's office; for return to work instructions, please ask our office staff and they will be happy to assist you in  obtaining this documentation   When to call us [phone]: 1. Poor pain control 2. Reactions / problems with new medications (rash/itching, etc)  3. Fever over 101.5 F (38.5 C) 4. Inability to urinate 5. Nausea/vomiting 6. Worsening swelling or bruising 7. Continued bleeding from incision. 8. Increased pain, redness, or drainage from the incision  The clinic staff is available to answer your questions during regular business hours (8:30am-5pm).  Please dont hesitate to call and ask to speak to one of our nurses for clinical concerns.   A surgeon from [facility] is always on call at the hospitals   If you have a medical emergency, go to the nearest emergency room or call 911.  [facility], [place] [address] [place], [place], [place]  [address] MAIN: [phone] FAX: [phone] [email or web address]

## 2017-09-25 NOTE — Progress Notes (Signed)
Discharge and medication instructions reviewed with patient. Questions answered and patient denies further questions. One prescription given to patient. Patient is calling for someone to come drive her home. Donne Hazel, RN

## 2017-09-25 NOTE — Discharge Summary (Signed)
Patient ID: IDALIA ALLBRITTON MRN: [id number] DOB/AGE: 1956-08-29 62 y.o.  Admit date: 09/18/2017 Discharge date: 09/25/2017  Discharge Diagnoses Patient Active Problem List   Diagnosis Date Noted  . Colon polyp 09/18/2017  . Morbid obesity ([place]) 03/08/2014    Class: History of    Consultants None  Procedures Laparoscopic converted to open right hemicolectomy 09/18/17 - pathology ended up showing colon cancer arising in the cecum.  Hospital course: Patient was taken to the OR and underwent the above mentioned procedure. Postoperatively, she initially opened up and had bowel function but subsequently had an expected postoperative ileus. She began having bowel function again 1/17 - having had 2-3 large BMs. She was tolerating a diet, pain was controlled on oral analgesics and she was ambulating. Home health was in place per PT/OT recommendations. Her WBC on 1/17 was noted at 12 but she had resolution of her ileus, no fevers, tolerating diet and clinically doing well. She was deemed stable for discharge home.  Her case had been presented at the multidisciplinary tumor board and the recommendation was to proceed with staging now that the diagnosis of cancer was established (adenomatous polyp on biopsies). She had positive lymph nodes and the recommendation was that ultimately she would need adjuvant chemotherapy. Oncology has requested to perform her staging CT scans at her follow-up appointment. All of this was discussed with the patient prior to discharge as well.   Allergies as of 09/25/2017      Reactions   Iodine Swelling   Sulfa Antibiotics Itching      Medication List    TAKE these medications   albuterol 108 (90 Base) MCG/ACT inhaler Commonly known as:  PROVENTIL HFA;VENTOLIN HFA Inhale 2 puffs into the lungs every 4 (four) hours as needed for wheezing or shortness of breath. What changed:  how much to take   budesonide-formoterol 160-4.5 MCG/ACT inhaler Commonly known as:   SYMBICORT Inhale 2 puffs into the lungs 2 (two) times daily.   CENTRUM SILVER PO Take 1 tablet by mouth daily.   DYMISTA 137-50 MCG/ACT Susp Generic drug:  Azelastine-Fluticasone Place 1 spray into the nose daily. 1 spray per each nostril daily   ketotifen 0.025 % ophthalmic solution Commonly known as:  ZADITOR Place 1 drop into both eyes 2 (two) times daily.   lansoprazole 15 MG capsule Commonly known as:  PREVACID Take 15 mg by mouth daily.   montelukast 10 MG tablet Commonly known as:  SINGULAIR Take 1 tablet (10 mg total) by mouth at bedtime.   SYSTANE OP Place 1 drop into both eyes 2 (two) times daily.   traMADol 50 MG tablet Commonly known as:  ULTRAM Take 1 tablet (50 mg total) by mouth every 6 (six) hours as needed for up to 7 days.   TURMERIC PO Take 1 capsule by mouth daily.   vitamin B-12 250 MCG tablet Commonly known as:  CYANOCOBALAMIN Take 250 mcg by mouth daily.   vitamin C 1000 MG tablet Take 1,000 mg by mouth daily.   Vitamin D3 1000 units Caps Take 1,000 Units by mouth daily.        Follow-up Information    [facility].   Specialty:  [place] Why:  nurse for wound check Contact information: [address] [place] [address] [phone]           Denora Wysocki M. Dema Severin, M.D. [facility]

## 2017-09-30 ENCOUNTER — Other Ambulatory Visit: Payer: BC Managed Care – PPO

## 2017-10-06 ENCOUNTER — Encounter: Payer: Self-pay | Admitting: *Deleted

## 2017-10-06 ENCOUNTER — Inpatient Hospital Stay: Payer: BC Managed Care – PPO | Attending: Oncology | Admitting: Oncology

## 2017-10-06 ENCOUNTER — Telehealth: Payer: Self-pay | Admitting: Oncology

## 2017-10-06 VITALS — BP 125/78 | HR 72 | Temp 97.7°F | Resp 18 | Ht 63.0 in | Wt 219.8 lb

## 2017-10-06 DIAGNOSIS — C182 Malignant neoplasm of ascending colon: Secondary | ICD-10-CM | POA: Diagnosis not present

## 2017-10-06 NOTE — Telephone Encounter (Signed)
Scheduled appt per 1/28 los - Gave patient AVS and calender per los.  

## 2017-10-06 NOTE — Progress Notes (Signed)
  Oncology Nurse Navigator Documentation  Navigator Location: [facility] (10/06/17 1523)   )Navigator Encounter Type: Initial MedOnc (10/06/17 1523)       Surgery Date: 09/18/17 (10/06/17 1523)                 Barriers/Navigation Needs: Education (10/06/17 1523) Education: Understanding Cancer/ Treatment Options;Coping with Diagnosis/ Prognosis;Other(Port-A-Cath Education) (10/06/17 1523) Interventions: Education (10/06/17 1523)  Met with patient and introduced myself and my role as GI Navigator. Written information from [email or web address] on colorectal treatment options and staging provided. Port-A -Cath education completed using [employer] from [place]. Patient encouraged to call with questions or concerns.   Education Method: Verbal;Written (10/06/17 1523)      Acuity: Level 2 (10/06/17 1523)         Time Spent with Patient: 15 (10/06/17 1523)

## 2017-10-06 NOTE — Progress Notes (Signed)
START OFF PATHWAY REGIMEN - Colorectal   [id number]:FOLFOX (q14d):   A cycle is every 14 days:     Oxaliplatin      Leucovorin      5-Fluorouracil      5-Fluorouracil   **Always confirm dose/schedule in your pharmacy ordering system**    Patient Characteristics: Colon Adjuvant, Stage III, Low Risk (T1-3, N1) Current evidence of distant metastases<= No AJCC T Category: Staged < 8th Ed. AJCC N Category: Staged < 8th Ed. AJCC M Category: Staged < 8th Ed. AJCC 8 Stage Grouping: Staged < 8th Ed. Intent of Therapy: Curative Intent, Discussed with Patient

## 2017-10-06 NOTE — Progress Notes (Signed)
[facility] Patient Consult   Referring MD: Nadeen Landau  ANALILIA GEDDIS 62 y.o.  1956/01/11    Reason for Referral: Colon cancer   HPI: Ms. Kibler underwent a screening colonoscopy by Dr. Collene Mares on 05/21/2017.  A small polyp was found in the rectum.  The polyp was removed.  A large polypoid lesion was found at the cecum.  A polypectomy was performed.  Resection and retrieval were not complete.  The pathology from the rectum returned as a hyperplastic polyp.  The cecum polyp was a tubulovillous adenoma with no high-grade dysplasia or malignancy.  She was referred to Dr. Dema Severin for surgical removal of the "polyp ".  She was taken to the operating room 09/18/2017 for a laparoscopic converted to open right hemicolectomy.  No evidence of metastatic disease.  Enlarged lymph nodes were noted at the ileocolic pedicle.  These were removed.  The pathology ([id number]) revealed an invasive adenocarcinoma of the cecum.  Tumor arose in a tubulovillous adenoma and was limited to the submucosa.  Lymphovascular invasion was present.  No tumor deposits.  The resection margins were negative.  3 of 14 lymph nodes contained metastatic carcinoma.  The tumor returned MSI-stable.  Her house are functioning.  She reports feeling completely well prior to surgery.    Past Medical History:  Diagnosis Date  . Anemia   . Asthma   . Colon polyp   . Fibroid   . GERD (gastroesophageal reflux disease)   . Heart murmur    per patient " dr white told me i had a murmur but i told him they told me that when i was a kid and it had never bothered me "   . Hypertension   . Sleep apnea     .  G0 P0  Past Surgical History:  Procedure Laterality Date  . ABDOMINAL HYSTERECTOMY     2002  . COLONOSCOPY N/A 09/18/2017   Procedure: COLONOSCOPY;  Surgeon: Ileana Roup, MD;  Location: [facility];  Service: General;  Laterality: N/A;  . LAPAROSCOPIC RIGHT HEMI COLECTOMY Right 09/18/2017   Procedure:  LAPAROSCOPIC RIGHT HEMI COLECTOMY ERAS PATHWAY;  Surgeon: Ileana Roup, MD;  Location: [facility];  Service: General;  Laterality: Right;  . MYOMECTOMY  DONE 5 YEARS PRIOR TO HYSTERECTOMY   multiple fibroids    Medications: Reviewed  Allergies:  Allergies  Allergen Reactions  . Iodine Swelling  . Sulfa Antibiotics Itching    Family history: Her mother and sister have a history of "leukemia "treated with oral medications.  A maternal uncle died of "leukemia ".  No other family history of cancer.  She has 1 brother and 1 sister.  Social History:   She lives in [place].  She currently works in domestic violence intake.  She does not use cigarettes.  She reports rare alcohol use.  No transfusion history.  No risk factor for HIV or hepatitis.  ROS:   Positives include: None  A complete ROS was otherwise negative.  Physical Exam:  Blood pressure 125/78, pulse 72, temperature 97.7 F (36.5 C), temperature source Oral, resp. rate 18, height '5\' 3"'$  (1.6 m), weight 219 lb 12.8 oz (99.7 kg), last menstrual period 07/10/2001, SpO2 100 %.  HEENT: Oropharynx without visible mass, neck without mass Lungs: Clear bilaterally Cardiac: Regular rate and rhythm Abdomen: No hepatosplenomegaly, healing midline incision with staples in place, no mass, nontender  Vascular: No leg edema Lymph nodes: No cervical, supraclavicular, axillary, or inguinal nodes  Neurologic: Alert and oriented, the motor exam appears intact in the upper and lower extremities Skin: No rash Musculoskeletal: No spine tenderness   LAB:  CBC  Lab Results  Component Value Date   WBC 12.0 (H) 09/25/2017   HGB 11.6 (L) 09/25/2017   HCT 35.1 (L) 09/25/2017   MCV 90.7 09/25/2017   PLT 366 09/25/2017   NEUTROABS 8.4 (H) 09/25/2017        CMP     Component Value Date/Time   NA 139 09/25/2017 0507   NA 138 04/04/2017 1009   K 3.6 09/25/2017 0507   CL 104 09/25/2017 0507   CO2 27 09/25/2017 0507   GLUCOSE  103 (H) 09/25/2017 0507   BUN 9 09/25/2017 0507   BUN 13 04/04/2017 1009   CREATININE 0.60 09/25/2017 0507   CREATININE 0.62 03/29/2016 1602   CALCIUM 10.1 09/25/2017 0507   PROT 7.0 09/15/2017 1019   PROT 6.7 04/04/2017 1009   ALBUMIN 3.9 09/15/2017 1019   ALBUMIN 4.3 04/04/2017 1009   AST 25 09/15/2017 1019   ALT 20 09/15/2017 1019   ALKPHOS 94 09/15/2017 1019   BILITOT 0.6 09/15/2017 1019   BILITOT 0.6 04/04/2017 1009   GFRNONAA >60 09/25/2017 0507   GFRAA >60 09/25/2017 0507       Assessment/Plan:   1. Adenocarcinoma of the cecum, stage IIIa (T1,N1b), status post a right colectomy 09/18/2017  MSI-stable  3/14 lymph nodes positive for metastatic carcinoma, lymphovascular invasion present  2.   Asthma   Disposition:   Ms. Slagel has been diagnosed with adenocarcinoma of the cecum.  The tumor was discovered incidentally at the time of a planned surgical resection of a tubular villous adenoma.  I discussed the details of the surgical pathology report, the prognosis, and adjuvant treatment options with her. We discussed the benefit associated with adjuvant 5-fluorouracil and oxaliplatin based chemotherapy in this setting.  I recommend adjuvant FOLFOX chemotherapy.  We reviewed the potential toxicities associated with this regimen including the chance for nausea/vomiting, mucositis, diarrhea, alopecia, and hematologic toxicity.  We discussed the sun sensitivity, hyperpigmentation, and hand/foot syndrome seen with 5-fluorouracil.  We discussed the allergic reaction and various types of neuropathy associated with oxaliplatin.  She agrees to proceed.  She will attended chemotherapy teaching class.  We will refer her to Dr. Dema Severin for placement of a Port-A-Cath.  The plan is to initiate FOLFOX chemotherapy 10/22/2017.  She will be referred for staging CTs of the chest, abdomen, and pelvis prior to beginning chemotherapy.  We will also check a baseline CEA.  She does not appear to  have hereditary non-polyposis colon cancer syndrome, but her family members are at increased risk of developing colon cancer and should receive appropriate screening.  50 minutes were spent with the patient today.  The majority of the time was used for counseling and coordination of care.  Betsy Coder, MD  10/06/2017, 2:32 PM

## 2017-10-09 ENCOUNTER — Encounter (HOSPITAL_BASED_OUTPATIENT_CLINIC_OR_DEPARTMENT_OTHER): Payer: BC Managed Care – PPO

## 2017-10-09 ENCOUNTER — Ambulatory Visit: Payer: Self-pay | Admitting: Surgery

## 2017-10-14 ENCOUNTER — Encounter (HOSPITAL_COMMUNITY): Payer: Self-pay | Admitting: *Deleted

## 2017-10-14 ENCOUNTER — Other Ambulatory Visit: Payer: Self-pay

## 2017-10-14 ENCOUNTER — Ambulatory Visit: Payer: Self-pay | Admitting: Surgery

## 2017-10-14 NOTE — H&P (Signed)
CC: Postop f/u  HPI: Sharon Allison is a pleasant 55yoF with hx of unresectable polyp in the cecum with pathology revealing tubulovillous adenoma - she is taken to the operating room 09/18/17 laparoscopic converted to open right hemicolectomy.  She recovered well from this and was ultimately discharged home 09/25/17 after recovering from a short postoperative ileus.  Since then she has done well.  She has been tolerating a diet without nausea/vomiting.  She is having formed and regular bowel movements.  She denies any wound complaints or other complaints today.  She denies fever/chills.  The patient is a 62 year old female.   Allergies (Sharon Allison, CMA; 10/14/2017 9:15 AM) Iodine *CHEMICALS*   Sulfa Antibiotics   Allergies Reconciled    Medication History Sharon Allison, CMA; 10/14/2017 9:16 AM) Singulair  (10MG  Tablet, Oral) Active. Artificial Tear Ointment  (Ophthalmic) Active. Zaditor  (0.025% Solution, Ophthalmic) Active. Systane  (0.4-0.3% Solution, Ophthalmic) Active. B Complex Vitamins  (Oral) Active. Albuterol  (90MCG/ACT Aerosol Soln, Inhalation) Active. Budesonide-Formoterol Fumarate  (160-4.5MCG/ACT Aerosol, Inhalation) Active. Vitamin C  (Oral) Specific strength unknown - Active. Vitamin D (Cholecalciferol)  (Oral) Specific strength unknown - Active. Prevacid 24HR  (15MG  Capsule DR, Oral) Active. Medications Reconciled     Review of Systems Sharon Gave M. Janay Canan MD; 10/14/2017 9:37 AM) General Not Present- Appetite Loss, Chills and Fever. Respiratory Not Present- Chronic Cough and Wheezing. Cardiovascular Not Present- Chest Pain and Difficulty Breathing Lying Down. Gastrointestinal Not Present- Abdominal Pain, Rectal Bleeding and Vomiting.  Vitals (Sharon Allison CMA; 10/14/2017 9:16 AM) 10/14/2017 9:16 AM Weight: 224 lb   Height: 63 in  Body Surface Area: 2.03 m   Body Mass Index: 39.68 kg/m   Temp.: 98.4 F (Oral)    Pulse: 80 (Regular)    BP: 160/96 (Sitting,  Left Arm, Standard)       Physical Exam Sharon Gave M. Markee Matera MD; 10/14/2017 9:38 AM) The physical exam findings are as follows: Note: General: NAD, comfortable CV: RRR, no pitting edema GI: Abd obese, soft, NT; incisions healed well - staples removed; no surrounding erythema or drainage    Assessment & Plan Sharon Gave M. Shante Archambeault MD; 10/14/2017 9:45 AM) COLON CANCER (C18.9) Impression: Ms. Demps is a very pleasant 3yoF with hx of unresectable tubulovillous adenoma of the cecum-taken to the operating room 09/18/17 for laparoscopic converted to open right hemicolectomy (secondary to small bowel adhesions into the pelvis from prior surgeries) - found to have enlarged nodes in the ileocolic pedicle - pathology returned adenocarcinoma of the cecum, LVI+; T1N1bMx. -Pathology was reviewed at length with the patient and the proposed treatment and surveillance course was additionally elaborated. All of her questions were answered -Staging CT C/A/P pending -CEA pending -She has seen Dr.Sherrill who is planning to start adjuvant chemotherapy and has requested a Port-A-Cath placement for this. We have her scheduled for this later this week -The planned procedure, material risks (including but not limited to pain, bleeding, pneumothorax, malpositioning of port requiring replacement, damage to surrounding vessels/nerves, need for additional procedures) benefits and alternatives were discussed. Her questions were answered to her satisfaction and she elected to proceed with surgery. Current Plans CT ABDOMEN AND PELVIS W CONTRAST [phone]) (Please schedule by mid February per Dr. Dema Severin.) CT CHEST W CON ([id number])

## 2017-10-15 ENCOUNTER — Other Ambulatory Visit: Payer: BC Managed Care – PPO

## 2017-10-15 ENCOUNTER — Other Ambulatory Visit (HOSPITAL_COMMUNITY): Payer: Self-pay | Admitting: Surgery

## 2017-10-15 DIAGNOSIS — C189 Malignant neoplasm of colon, unspecified: Secondary | ICD-10-CM

## 2017-10-17 ENCOUNTER — Ambulatory Visit (HOSPITAL_COMMUNITY): Payer: BC Managed Care – PPO

## 2017-10-17 ENCOUNTER — Encounter (HOSPITAL_COMMUNITY): Payer: Self-pay | Admitting: *Deleted

## 2017-10-17 ENCOUNTER — Ambulatory Visit (HOSPITAL_COMMUNITY): Payer: BC Managed Care – PPO | Admitting: Anesthesiology

## 2017-10-17 ENCOUNTER — Encounter (HOSPITAL_COMMUNITY)
Admission: RE | Disposition: A | Discharge status: Home / Self Care | Payer: Self-pay | Source: Ambulatory Visit | Attending: Surgery

## 2017-10-17 ENCOUNTER — Ambulatory Visit (HOSPITAL_COMMUNITY)
Admission: RE | Admit: 2017-10-17 | Discharge: 2017-10-17 | Disposition: A | Discharge status: Home / Self Care | Payer: BC Managed Care – PPO | Source: Ambulatory Visit | Attending: Surgery | Admitting: Surgery

## 2017-10-17 DIAGNOSIS — G4730 Sleep apnea, unspecified: Secondary | ICD-10-CM | POA: Insufficient documentation

## 2017-10-17 DIAGNOSIS — J45909 Unspecified asthma, uncomplicated: Secondary | ICD-10-CM | POA: Insufficient documentation

## 2017-10-17 DIAGNOSIS — I10 Essential (primary) hypertension: Secondary | ICD-10-CM | POA: Insufficient documentation

## 2017-10-17 DIAGNOSIS — C180 Malignant neoplasm of cecum: Secondary | ICD-10-CM | POA: Diagnosis not present

## 2017-10-17 DIAGNOSIS — Z79899 Other long term (current) drug therapy: Secondary | ICD-10-CM | POA: Insufficient documentation

## 2017-10-17 DIAGNOSIS — Z452 Encounter for adjustment and management of vascular access device: Secondary | ICD-10-CM

## 2017-10-17 HISTORY — PX: PORTACATH PLACEMENT: SHX2246

## 2017-10-17 SURGERY — INSERTION, TUNNELED CENTRAL VENOUS DEVICE, WITH PORT
Anesthesia: General

## 2017-10-17 MED ORDER — ONDANSETRON HCL 4 MG/2ML IJ SOLN
INTRAMUSCULAR | Status: AC
  Filled 2017-10-17: qty 2

## 2017-10-17 MED ORDER — BUPIVACAINE-EPINEPHRINE 0.25% -1:200000 IJ SOLN
INTRAMUSCULAR | Status: DC | PRN
  Administered 2017-10-17: 15 mL

## 2017-10-17 MED ORDER — CHLORHEXIDINE GLUCONATE CLOTH 2 % EX PADS: 6.0000 | MEDICATED_PAD | Freq: Once | CUTANEOUS | Status: DC

## 2017-10-17 MED ORDER — ONDANSETRON HCL 4 MG/2ML IJ SOLN
INTRAMUSCULAR | Status: DC | PRN
  Administered 2017-10-17: 4 mg via INTRAVENOUS

## 2017-10-17 MED ORDER — HEPARIN SOD (PORK) LOCK FLUSH 100 UNIT/ML IV SOLN
INTRAVENOUS | Status: AC
  Filled 2017-10-17: qty 5

## 2017-10-17 MED ORDER — IOPAMIDOL (ISOVUE-300) INJECTION 61%
INTRAVENOUS | Status: AC
  Filled 2017-10-17: qty 50

## 2017-10-17 MED ORDER — EPHEDRINE 5 MG/ML INJ
INTRAVENOUS | Status: AC
Start: 2017-10-17 — End: 2017-10-17
  Filled 2017-10-17: qty 10

## 2017-10-17 MED ORDER — MIDAZOLAM HCL 2 MG/2ML IJ SOLN
INTRAMUSCULAR | Status: AC
  Filled 2017-10-17: qty 2

## 2017-10-17 MED ORDER — SODIUM CHLORIDE 0.9 % IV SOLN
Freq: Once | INTRAVENOUS | Status: AC
  Administered 2017-10-17: 500 mL
  Filled 2017-10-17: qty 1.2

## 2017-10-17 MED ORDER — PROPOFOL 10 MG/ML IV BOLUS
INTRAVENOUS | Status: AC
  Filled 2017-10-17: qty 20

## 2017-10-17 MED ORDER — ACETAMINOPHEN 500 MG PO TABS
1000.0000 mg | ORAL_TABLET | ORAL | Status: AC
  Administered 2017-10-17: 1000 mg via ORAL
  Filled 2017-10-17: qty 2

## 2017-10-17 MED ORDER — CEFAZOLIN SODIUM-DEXTROSE 2-4 GM/100ML-% IV SOLN
INTRAVENOUS | Status: AC
  Filled 2017-10-17: qty 100

## 2017-10-17 MED ORDER — EPHEDRINE SULFATE-NACL 50-0.9 MG/10ML-% IV SOSY
PREFILLED_SYRINGE | INTRAVENOUS | Status: DC | PRN
  Administered 2017-10-17: 5 mg via INTRAVENOUS

## 2017-10-17 MED ORDER — LIDOCAINE-EPINEPHRINE 1 %-1:100000 IJ SOLN
INTRAMUSCULAR | Status: AC
  Filled 2017-10-17: qty 1

## 2017-10-17 MED ORDER — DEXAMETHASONE SODIUM PHOSPHATE 10 MG/ML IJ SOLN
INTRAMUSCULAR | Status: AC
  Filled 2017-10-17: qty 1

## 2017-10-17 MED ORDER — DEXAMETHASONE SODIUM PHOSPHATE 10 MG/ML IJ SOLN
INTRAMUSCULAR | Status: DC | PRN
  Administered 2017-10-17: 10 mg via INTRAVENOUS

## 2017-10-17 MED ORDER — PROPOFOL 10 MG/ML IV BOLUS
INTRAVENOUS | Status: DC | PRN
  Administered 2017-10-17: 160 mg via INTRAVENOUS

## 2017-10-17 MED ORDER — FENTANYL CITRATE (PF) 100 MCG/2ML IJ SOLN
INTRAMUSCULAR | Status: DC | PRN
  Administered 2017-10-17 (×2): 50 ug via INTRAVENOUS

## 2017-10-17 MED ORDER — FENTANYL CITRATE (PF) 100 MCG/2ML IJ SOLN
INTRAMUSCULAR | Status: AC
  Filled 2017-10-17: qty 2

## 2017-10-17 MED ORDER — IOHEXOL 300 MG/ML  SOLN
INTRAMUSCULAR | Status: DC | PRN
  Administered 2017-10-17: 3 mL

## 2017-10-17 MED ORDER — LIDOCAINE 2% (20 MG/ML) 5 ML SYRINGE
INTRAMUSCULAR | Status: AC
  Filled 2017-10-17: qty 5

## 2017-10-17 MED ORDER — FENTANYL CITRATE (PF) 100 MCG/2ML IJ SOLN
25.0000 ug | INTRAMUSCULAR | Status: DC | PRN
  Administered 2017-10-17: 50 ug via INTRAVENOUS

## 2017-10-17 MED ORDER — BUPIVACAINE-EPINEPHRINE 0.25% -1:200000 IJ SOLN
INTRAMUSCULAR | Status: AC
  Filled 2017-10-17: qty 1

## 2017-10-17 MED ORDER — MIDAZOLAM HCL 5 MG/5ML IJ SOLN
INTRAMUSCULAR | Status: DC | PRN
  Administered 2017-10-17: 2 mg via INTRAVENOUS

## 2017-10-17 MED ORDER — 0.9 % SODIUM CHLORIDE (POUR BTL) OPTIME
TOPICAL | Status: DC | PRN
  Administered 2017-10-17: 1000 mL

## 2017-10-17 MED ORDER — LACTATED RINGERS IV SOLN
INTRAVENOUS | Status: DC
  Administered 2017-10-17: 12:00:00 via INTRAVENOUS

## 2017-10-17 MED ORDER — CEFAZOLIN SODIUM-DEXTROSE 2-4 GM/100ML-% IV SOLN
2.0000 g | INTRAVENOUS | Status: AC
  Administered 2017-10-17: 2 g via INTRAVENOUS
  Filled 2017-10-17: qty 100

## 2017-10-17 MED ORDER — LIDOCAINE 2% (20 MG/ML) 5 ML SYRINGE
INTRAMUSCULAR | Status: DC | PRN
  Administered 2017-10-17: 100 mg via INTRAVENOUS

## 2017-10-17 SURGICAL SUPPLY — 42 items
ADH SKN CLS APL DERMABOND .7 (GAUZE/BANDAGES/DRESSINGS) ×1
APL SKNCLS STERI-STRIP NONHPOA (GAUZE/BANDAGES/DRESSINGS) ×1
BAG DECANTER FOR FLEXI CONT (MISCELLANEOUS) ×2 IMPLANT
BENZOIN TINCTURE PRP APPL 2/3 (GAUZE/BANDAGES/DRESSINGS) ×2 IMPLANT
BLADE HEX COATED 2.75 (ELECTRODE) ×2 IMPLANT
BLADE SURG 15 STRL LF DISP TIS (BLADE) ×1 IMPLANT
BLADE SURG 15 STRL SS (BLADE) ×2
BLADE SURG SZ11 CARB STEEL (BLADE) ×2 IMPLANT
CHLORAPREP W/TINT 26ML (MISCELLANEOUS) ×2 IMPLANT
COVER PROBE U/S 5X48 (MISCELLANEOUS) ×1 IMPLANT
DECANTER SPIKE VIAL GLASS SM (MISCELLANEOUS) ×2 IMPLANT
DERMABOND ADVANCED (GAUZE/BANDAGES/DRESSINGS) ×1
DERMABOND ADVANCED .7 DNX12 (GAUZE/BANDAGES/DRESSINGS) IMPLANT
DRAPE C-ARM 42X120 X-RAY (DRAPES) ×2 IMPLANT
DRAPE LAPAROSCOPIC ABDOMINAL (DRAPES) ×2 IMPLANT
DRSG TEGADERM 2-3/8X2-3/4 SM (GAUZE/BANDAGES/DRESSINGS) ×2 IMPLANT
DRSG TEGADERM 4X4.75 (GAUZE/BANDAGES/DRESSINGS) ×2 IMPLANT
ELECT PENCIL ROCKER SW 15FT (MISCELLANEOUS) ×2 IMPLANT
ELECT REM PT RETURN 15FT ADLT (MISCELLANEOUS) ×2 IMPLANT
GAUZE SPONGE 4X4 12PLY STRL (GAUZE/BANDAGES/DRESSINGS) ×2 IMPLANT
GAUZE SPONGE 4X4 16PLY XRAY LF (GAUZE/BANDAGES/DRESSINGS) ×2 IMPLANT
GLOVE ECLIPSE 8.0 STRL XLNG CF (GLOVE) ×2 IMPLANT
GLOVE INDICATOR 8.0 STRL GRN (GLOVE) ×2 IMPLANT
GOWN STRL REUS W/TWL XL LVL3 (GOWN DISPOSABLE) ×4 IMPLANT
KIT BASIN OR (CUSTOM PROCEDURE TRAY) ×2 IMPLANT
KIT PORT POWER 8FR ISP CVUE (Miscellaneous) ×2 IMPLANT
NDL HYPO 25X1 1.5 SAFETY (NEEDLE) ×1 IMPLANT
NEEDLE HYPO 25X1 1.5 SAFETY (NEEDLE) ×2 IMPLANT
PACK BASIC VI WITH GOWN DISP (CUSTOM PROCEDURE TRAY) ×2 IMPLANT
SPONGE LAP 4X18 X RAY DECT (DISPOSABLE) ×1 IMPLANT
STRIP CLOSURE SKIN 1/2X4 (GAUZE/BANDAGES/DRESSINGS) ×2 IMPLANT
SUT MNCRL AB 4-0 PS2 18 (SUTURE) ×2 IMPLANT
SUT PROLENE 2 0 SH DA (SUTURE) ×3 IMPLANT
SUT VIC AB 2-0 SH 18 (SUTURE) IMPLANT
SUT VIC AB 2-0 SH 27 (SUTURE)
SUT VIC AB 2-0 SH 27X BRD (SUTURE) IMPLANT
SUT VIC AB 3-0 SH 27 (SUTURE) ×4
SUT VIC AB 3-0 SH 27XBRD (SUTURE) ×1 IMPLANT
SYR 10ML LL (SYRINGE) ×2 IMPLANT
SYR 20CC LL (SYRINGE) ×2 IMPLANT
TOWEL OR 17X26 10 PK STRL BLUE (TOWEL DISPOSABLE) ×2 IMPLANT
TOWEL OR NON WOVEN STRL DISP B (DISPOSABLE) ×2 IMPLANT

## 2017-10-17 NOTE — Discharge Instructions (Addendum)
POST OP INSTRUCTIONS  1. DIET: As tolerated.  2. Take your usually prescribed home medications unless otherwise directed.  3. PAIN CONTROL: a. Pain is best controlled by a usual combination of three different methods TOGETHER: i. Ice/Heat ii. Over the counter pain medication iii. Prescription pain medication b. Most patients will experience some swelling and bruising around the surgical site.  Ice packs or heating pads (30-60 minutes up to 6 times a day) will help. Some people prefer to use ice alone, heat alone, alternating between ice & heat.  Experiment to what works for you.  Swelling and bruising can take several weeks to resolve.   c. It is helpful to take an over-the-counter pain medication regularly for the first few weeks: i. Ibuprofen (Motrin/Advil) - 200mg  tabs - take 3 tabs (600mg ) every 6 hours as needed for pain ii. Acetaminophen (Tylenol) - you may take 650mg  every 6 hours as needed. You can take this with motrin as they act differently on the body. If you are taking a narcotic pain medication that has acetaminophen in it, do not take over the counter tylenol at the same time.  Iii. NOTE: You may take both of these medications together - most patients  find it most helpful when alternating between the two (i.e. Ibuprofen at 6am,  tylenol at 9am, ibuprofen at 12pm ...) d. Take the pain medication you already have at home as prescribed if your pain is not adequatly controlled with the over-the-counter pain reliefs mentioned above.  4. Dressing: Your incision is covered in Dermabond which is like sterile superglue for the skin. This will come off on it's own in a couple weeks. It is waterproof and you may bathe normally starting the day after your surgery in a shower. Avoid baths/pools/lakes/oceans until your wounds have fully healed (~2 weeks).  5. ACTIVITIES as tolerated:   a. You may resume regular (light) daily activities beginning the next day--such as daily self-care, walking,  climbing stairs--gradually increasing activities as tolerated.  If you can walk 30 minutes without difficulty, it is safe to try more intense activity such as jogging, treadmill, bicycling, low-impact aerobics.  b. DO NOT PUSH THROUGH PAIN.  Let pain be your guide: If it hurts to do something, don't do it. c. You may drive when you are no longer taking prescription pain medication, you can comfortably wear a seatbelt, and you can safely maneuver your car and apply brakes. d. Dennis Bast may have sexual intercourse when it is comfortable.   6. FOLLOW UP in our office if questions/concerns arise - otherwise, Dr. Benay Spice will be seeing you next week for chemotherapy via your port.   When to call us [phone]: 1. Poor pain control 2. Reactions / problems with new medications (rash/itching, etc)  3. Fever over 101.5 F (38.5 C) 4. Inability to urinate 5. Nausea/vomiting 6. Worsening swelling or bruising 7. Continued bleeding from incision. 8. Increased pain, redness, or drainage from the incision  The clinic staff is available to answer your questions during regular business hours (8:30am-5pm).  Please dont hesitate to call and ask to speak to one of our nurses for clinical concerns.   A surgeon from [facility] is always on call at the hospitals   If you have a medical emergency, go to the nearest emergency room or call 911.  [facility], [place] [address] [place], [place], [place]  [address] MAIN: [phone] FAX: [phone] [email or web address]  General Anesthesia, Adult, Care After  These instructions provide you with information about caring for yourself after your procedure. Your health care provider may also give you more specific instructions. Your treatment has been planned according to current medical practices, but problems sometimes occur. Call your health care provider if you have any problems or questions after your procedure. What  can I expect after the procedure? After the procedure, it is common to have:  Vomiting.  A sore throat.  Mental slowness.  It is common to feel:  Nauseous.  Cold or shivery.  Sleepy.  Tired.  Sore or achy, even in parts of your body where you did not have surgery.  Follow these instructions at home: For at least 24 hours after the procedure:  Do not: ? Participate in activities where you could fall or become injured. ? Drive. ? Use heavy machinery. ? Drink alcohol. ? Take sleeping pills or medicines that cause drowsiness. ? Make important decisions or sign legal documents. ? Take care of children on your own.  Rest. Eating and drinking  If you vomit, drink water, juice, or soup when you can drink without vomiting.  Drink enough fluid to keep your urine clear or pale yellow.  Make sure you have little or no nausea before eating solid foods.  Follow the diet recommended by your health care provider. General instructions  Have a responsible adult stay with you until you are awake and alert.  Return to your normal activities as told by your health care provider. Ask your health care provider what activities are safe for you.  Take over-the-counter and prescription medicines only as told by your health care provider.  If you smoke, do not smoke without supervision.  Keep all follow-up visits as told by your health care provider. This is important. Contact a health care provider if:  You continue to have nausea or vomiting at home, and medicines are not helpful.  You cannot drink fluids or start eating again.  You cannot urinate after 8-12 hours.  You develop a skin rash.  You have fever.  You have increasing redness at the site of your procedure. Get help right away if:  You have difficulty breathing.  You have chest pain.  You have unexpected bleeding.  You feel that you are having a life-threatening or urgent problem. This information is not  intended to replace advice given to you by your health care provider. Make sure you discuss any questions you have with your health care provider. Document Released: 12/02/2000 Document Revised: 01/29/2016 Document Reviewed: 08/10/2015 Elsevier Interactive Patient Education  2018 [place] Insertion, Care After This sheet gives you information about how to care for yourself after your procedure. Your health care provider may also give you more specific instructions. If you have problems or questions, contact your health care provider. What can I expect after the procedure? After your procedure, it is common to have:  Discomfort at the port insertion site.  Bruising on the skin over the port. This should improve over 3-4 days.  Follow these instructions at home: [facility] care  After your port is placed, you will get a manufacturer's information card. The card has information about your port. Keep this card with you at all times.  Take care of the port as told by your health care provider. Ask your health care provider if you or a family member can get training for taking care of the port at home. A home health care nurse may also take  care of the port.  Make sure to remember what type of port you have. Incision care  Follow instructions from your health care provider about how to take care of your port insertion site. Make sure you: ? Wash your hands with soap and water before you change your bandage (dressing). If soap and water are not available, use hand sanitizer. ? Change your dressing as told by your health care provider.   ? Leave skin glue in place. These skin closures may need to stay in place for 2 weeks or longer. If adhesive strip edges start to loosen and curl up, you may trim the loose edges. Do not remove adhesive strips completely unless your health care provider tells you to do that.  DO NOT use EMLA cream for 2 weeks after port placement as this cream  will remove surgical glue on your incision.  Check your port insertion site every day for signs of infection. Check for: ? More redness, swelling, or pain. ? More fluid or blood. ? Warmth. ? Pus or a bad smell. General instructions  Do not take baths, swim, or use a hot tub until your health care provider approves.  You may shower tomorrow.  Do not lift anything that is heavier than 10 lb (4.5 kg) for a week, or as told by your health care provider.  Ask your health care provider when it is okay to: ? Return to work or school. ? Resume usual physical activities or sports.  Do not drive for 24 hours if you were given a medicine to help you relax (sedative).  Take over-the-counter and prescription medicines only as told by your health care provider.  Wear a medical alert bracelet in case of an emergency. This will tell any health care providers that you have a port.  Keep all follow-up visits as told by your health care provider. This is important. Contact a health care provider if:  You have a fever or chills.  You have more redness, swelling, or pain around your port insertion site.  You have more fluid or blood coming from your port insertion site.  Your port insertion site feels warm to the touch.  You have pus or a bad smell coming from the port insertion site. Get help right away if:  You have chest pain or shortness of breath.  You have bleeding from your port that you cannot control. Summary  Take care of the port as told by your health care provider.  Change your dressing as told by your health care provider.  Keep all follow-up visits as told by your health care provider. This information is not intended to replace advice given to you by your health care provider. Make sure you discuss any questions you have with your health care provider. Document Released: 06/16/2013 Document Revised: 07/17/2016 Document Reviewed: 07/17/2016 Elsevier Interactive Patient  Education  2017 [employer].

## 2017-10-17 NOTE — Anesthesia Procedure Notes (Signed)
Procedure Name: LMA Insertion Date/Time: 10/17/2017 1:06 PM Performed by: Marcelle Bebout D, CRNA Pre-anesthesia Checklist: Patient identified, Emergency Drugs available, Suction available and Patient being monitored Patient Re-evaluated:Patient Re-evaluated prior to induction Oxygen Delivery Method: Circle system utilized Preoxygenation: Pre-oxygenation with 100% oxygen Induction Type: IV induction Ventilation: Mask ventilation without difficulty LMA: LMA inserted LMA Size: 4.0 Tube type: Oral Number of attempts: 1 Placement Confirmation: positive ETCO2 and breath sounds checked- equal and bilateral Tube secured with: Tape Dental Injury: Teeth and Oropharynx as per pre-operative assessment

## 2017-10-17 NOTE — Progress Notes (Signed)
Per Dr. Glennon Mac MDA, do not need to repeat labs for this case.

## 2017-10-17 NOTE — Op Note (Signed)
10/17/2017  2:24 PM  PATIENT:  Sharon Allison  62 y.o. female  Patient Care Team: Regina Eck, CNM as PCP - General (Certified Nurse Midwife)  PRE-OPERATIVE DIAGNOSIS:  Colon cx  POST-OPERATIVE DIAGNOSIS:  Colon cx  PROCEDURE:  Placement of port-a-cath with fluoroscopy  SURGEON:  Sharon Mt. Kiarrah Rausch, MD  ASSISTANT: None   ANESTHESIA:  General  COUNTS:  Sponge, needle and instrument counts were reported correct x2 at the conclusion of the operation.  EBL: 20cc  DRAINS: None  SPECIMEN: None  FINDINGS: Ultrasound access demonstrated a patent right internal jugular vein.  This was accessed on the first attempt.  Port was placed with fluoroscopy.  At conclusion, the tip of the catheter set at the cavoatrial junction, flushed, and aspirated freely.  This was subsequently locked with heparinized saline.  DISPOSITION: PACU in satisfactory condition  INDICATION: Ms. Hinnenkamp is a very pleasant 45yoF with hx of unresectable tubulovillous adenoma of the cecum-taken to the operating room 09/18/17 for laparoscopic converted to open right hemicolectomy (secondary to small bowel adhesions into the pelvis from prior surgeries) - found to have enlarged nodes in the ileocolic pedicle - pathology returned adenocarcinoma of the cecum, LVI+; T1N1bMx. She seen by Dr. Benay Spice in medical oncology who has recommended adjuvant FOLFOX. A port-a-cath was requested for delivery of the chemotherapy. The anatomy of the neck and chest was explained in detail. The indications for port placement and technical details of placement was discussed at length as well - the planned procedure, material risks (including but not limited to pain, bleeding, pneumothorax, malpositioning of port requiring replacement, damage to surrounding vessels/nerves, need for additional procedures) benefits and alternatives were discussed. Her questions were answered to her satisfaction and she elected to proceed with  surgery.  DESCRIPTION: The patient was identified in preop holding and taken to the OR where they were placed on the operating room table and SCDs were placed. General endotracheal anesthesia was induced without difficulty. The patient was then prepped and draped in the usual sterile fashion. A surgical timeout was performed indicating the correct patient, procedure, positioning and need for preoperative antibiotics.  An ultrasound was used to visualize the vasculature of the right neck.  The right internal jugular vein was identified.  Local anesthetic was infiltrated into the subcutaneous tissue.  The right jugular vein was accessed under ultrasound guidance on the first attempt.  A J-wire was then advanced.  Fluoroscopy was used to confirm the location of the wire which set at the right cavoatrial junction and did not cross midline or the spine.  The wire was then secured to the drape and attention was turned to creating the pocket for the Port-A-Cath.  An infraclavicular incision was made and dissection carried down to the subcutaneous tissue and ultimately to the level of the pectoralis fascia on the right side.  Once the pocket been developed, attention was turned to placement of the peel-away sheath.  Under fluoroscopic guidance the peel-away sheath was advanced on the wire and the J-wire as well as internal dilator was removed.  The port tubing was then advanced and the peel-away sheath cracked and peeled away.  Omnipaque contrast was then instilled into the Port-A-Cath tubing to aid in visualization.  The tubing was slowly withdrawn under fluoroscopic guidance until it set at the cavoatrial junction.  Attention was then turned to tunneling the tubing.  A tunneler was passed from the pocket incision to the right neck puncture site.  Prior to doing this the  planned tract was infiltrated with local anesthetic.  The tubing was then tunneled to the port site.  This was then connected to the port and locked  with the plastic flange.  The port was then triply secured to the underlying pectoralis fascia with 2-0 Prolene sutures.  A repeat image with fluoroscopy was obtained and demonstrated the tip of the tubing to remain in adequate position.  The pocket incision subcutaneous tissue was approximated with 3-0 Vicryl suture.  The skin of both incision sites were closed with 4-0 Monocryl subcuticular sutures.  A Huber needle was then used to access the port and confirmed the port easily aspirated blood and flushed. The port was then locked with heparinized saline.  Note: This dictation was prepared with Dragon/digital dictation along with [employer]. Any transcriptional errors that result from this process are unintentional.

## 2017-10-17 NOTE — H&P (Signed)
H&P Update  H&P from 10/14/17 was reviewed by myself with the patient. She reports no interval change to her health history.  Vitals:   10/17/17 1152 10/17/17 1217  BP: (!) 156/94   Pulse: 74   Resp: 20   TempSrc: Oral   SpO2: 95%   Weight: 100.8 kg (222 lb 3.2 oz) 100.7 kg (222 lb)  Height: 5\' 3"  (1.6 m) 5\' 3"  (1.6 m)   Gen: NAD, comfortable CV: RRR Abd: Soft, Nt/ND  A/P Sharon Allison is a very pleasant 65yoF with hx of unresectable tubulovillous adenoma of the cecum-taken to the operating room 09/18/17 for laparoscopic converted to open right hemicolectomy (secondary to small bowel adhesions into the pelvis from prior surgeries) - found to have enlarged nodes in the ileocolic pedicle - pathology returned adenocarcinoma of the cecum, LVI+; T1N1bMx.  -OR for port-a-cath placement -The anatomy of the neck and chest was explained in detail. The indications for port placement and technical details of placement was discussed at length as well - the planned procedure, material risks (including but not limited to pain, bleeding, pneumothorax, malpositioning of port requiring replacement, damage to surrounding vessels/nerves, need for additional procedures) benefits and alternatives were discussed. Her questions were answered to her satisfaction and she elected to proceed with surgery.

## 2017-10-17 NOTE — Anesthesia Preprocedure Evaluation (Signed)
Anesthesia Evaluation  Patient identified by MRN, date of birth, ID band Patient awake    Reviewed: Allergy & Precautions, H&P , Patient's Chart, lab work & pertinent test results, reviewed documented beta blocker date and time   Airway Mallampati: II  TM Distance: >3 FB Neck ROM: full    Dental no notable dental hx.    Pulmonary asthma , sleep apnea ,    Pulmonary exam normal breath sounds clear to auscultation       Cardiovascular hypertension,  Rhythm:regular Rate:Normal     Neuro/Psych    GI/Hepatic   Endo/Other    Renal/GU      Musculoskeletal   Abdominal   Peds  Hematology   Anesthesia Other Findings   Reproductive/Obstetrics                             Anesthesia Physical Anesthesia Plan  ASA: II  Anesthesia Plan: General   Post-op Pain Management:    Induction: Intravenous  PONV Risk Score and Plan: 2 and Dexamethasone, Ondansetron and Treatment may vary due to age or medical condition  Airway Management Planned: LMA  Additional Equipment:   Intra-op Plan:   Post-operative Plan:   Informed Consent: I have reviewed the patients History and Physical, chart, labs and discussed the procedure including the risks, benefits and alternatives for the proposed anesthesia with the patient or authorized representative who has indicated his/her understanding and acceptance.   Dental Advisory Given  Plan Discussed with: CRNA and Surgeon  Anesthesia Plan Comments: ( )        Anesthesia Quick Evaluation

## 2017-10-17 NOTE — Anesthesia Postprocedure Evaluation (Signed)
Anesthesia Post Note  Patient: MARCEDES TECH  Procedure(s) Performed: INSERTION PORT-A-CATH (N/A )     Patient location during evaluation: PACU Anesthesia Type: General Level of consciousness: awake and alert Pain management: pain level controlled Vital Signs Assessment: post-procedure vital signs reviewed and stable Respiratory status: spontaneous breathing, nonlabored ventilation, respiratory function stable and patient connected to nasal cannula oxygen Cardiovascular status: blood pressure returned to baseline and stable Postop Assessment: no apparent nausea or vomiting Anesthetic complications: no    Last Vitals:  Vitals:   10/17/17 1525 10/17/17 1602  BP: (!) 164/86 (!) 162/85  Pulse: 62 67  Resp: 16 14  Temp: (!) 36.4 C 36.4 C  SpO2: 98% 100%    Last Pain:  Vitals:   10/17/17 1602  TempSrc: Oral                 Renwick Asman EDWARD

## 2017-10-17 NOTE — Transfer of Care (Signed)
Immediate Anesthesia Transfer of Care Note  Patient: Sharon Allison  Procedure(s) Performed: INSERTION PORT-A-CATH (N/A )  Patient Location: PACU  Anesthesia Type:General  Level of Consciousness: awake, alert  and oriented  Airway & Oxygen Therapy: Patient Spontanous Breathing and Patient connected to face mask oxygen  Post-op Assessment: Report given to RN and Post -op Vital signs reviewed and stable  Post vital signs: Reviewed and stable  Last Vitals:  Vitals:   10/17/17 1152  BP: (!) 156/94  Pulse: 74  Resp: 20  SpO2: 95%    Last Pain:  Vitals:   10/17/17 1152  TempSrc: Oral         Complications: No apparent anesthesia complications

## 2017-10-19 ENCOUNTER — Other Ambulatory Visit: Payer: Self-pay | Admitting: Oncology

## 2017-10-20 ENCOUNTER — Encounter: Payer: Self-pay | Admitting: *Deleted

## 2017-10-20 ENCOUNTER — Encounter: Payer: Self-pay | Admitting: Pharmacist

## 2017-10-20 ENCOUNTER — Encounter (HOSPITAL_COMMUNITY): Payer: Self-pay | Admitting: Surgery

## 2017-10-21 ENCOUNTER — Inpatient Hospital Stay: Payer: BC Managed Care – PPO

## 2017-10-21 ENCOUNTER — Inpatient Hospital Stay: Payer: BC Managed Care – PPO | Attending: Oncology

## 2017-10-21 DIAGNOSIS — Z5111 Encounter for antineoplastic chemotherapy: Secondary | ICD-10-CM | POA: Diagnosis not present

## 2017-10-21 DIAGNOSIS — C182 Malignant neoplasm of ascending colon: Secondary | ICD-10-CM

## 2017-10-21 DIAGNOSIS — C180 Malignant neoplasm of cecum: Secondary | ICD-10-CM | POA: Insufficient documentation

## 2017-10-21 LAB — CMP (CANCER CENTER ONLY)
ALT: 15 U/L (ref 0–55)
AST: 12 U/L (ref 5–34)
Albumin: 3.7 g/dL (ref 3.5–5.0)
Alkaline Phosphatase: 83 U/L (ref 40–150)
Anion gap: 10 (ref 3–11)
BILIRUBIN TOTAL: 0.6 mg/dL (ref 0.2–1.2)
BUN: 8 mg/dL (ref 7–26)
CO2: 27 mmol/L (ref 22–29)
CREATININE: 0.71 mg/dL (ref 0.60–1.10)
Calcium: 10.4 mg/dL (ref 8.4–10.4)
Chloride: 103 mmol/L (ref 98–109)
Glucose, Bld: 115 mg/dL (ref 70–140)
POTASSIUM: 3.4 mmol/L — AB (ref 3.5–5.1)
Sodium: 140 mmol/L (ref 136–145)
TOTAL PROTEIN: 6.6 g/dL (ref 6.4–8.3)

## 2017-10-21 LAB — CBC WITH DIFFERENTIAL (CANCER CENTER ONLY)
BASOS ABS: 0.1 10*3/uL (ref 0.0–0.1)
Basophils Relative: 1 %
EOS ABS: 0.2 10*3/uL (ref 0.0–0.5)
EOS PCT: 2 %
HCT: 34.2 % — ABNORMAL LOW (ref 34.8–46.6)
HEMOGLOBIN: 11.2 g/dL — AB (ref 11.6–15.9)
Lymphocytes Relative: 15 %
Lymphs Abs: 1.7 10*3/uL (ref 0.9–3.3)
MCH: 29.6 pg (ref 25.1–34.0)
MCHC: 32.7 g/dL (ref 31.5–36.0)
MCV: 90.5 fL (ref 79.5–101.0)
Monocytes Absolute: 0.7 10*3/uL (ref 0.1–0.9)
Monocytes Relative: 6 %
NEUTROS PCT: 76 %
Neutro Abs: 8.4 10*3/uL — ABNORMAL HIGH (ref 1.5–6.5)
PLATELETS: 278 10*3/uL (ref 145–400)
RBC: 3.78 MIL/uL (ref 3.70–5.45)
RDW: 14 % (ref 11.2–14.5)
WBC: 11.1 10*3/uL — AB (ref 3.9–10.3)

## 2017-10-21 LAB — CEA (IN HOUSE-CHCC): CEA (CHCC-In House): <1 ng/mL (ref 0.00–5.00)

## 2017-10-21 NOTE — Progress Notes (Signed)
  Oncology Nurse Navigator Documentation  Navigator Location: [facility] (10/21/17 1400)   )Navigator Encounter Type: Telephone (10/21/17 1400)                         Barriers/Navigation Needs: Education (10/21/17 1400)  Message relayed to me that Sharon Allison needed additional chemo ed questions answered. VM left requesting that she call me back.              Acuity: Level 2 (10/21/17 1400)         Time Spent with Patient: 15 (10/21/17 1400)

## 2017-10-22 ENCOUNTER — Inpatient Hospital Stay (HOSPITAL_BASED_OUTPATIENT_CLINIC_OR_DEPARTMENT_OTHER): Payer: BC Managed Care – PPO | Admitting: Nurse Practitioner

## 2017-10-22 ENCOUNTER — Encounter: Payer: Self-pay | Admitting: Nurse Practitioner

## 2017-10-22 ENCOUNTER — Telehealth: Payer: Self-pay | Admitting: Nurse Practitioner

## 2017-10-22 ENCOUNTER — Inpatient Hospital Stay: Payer: BC Managed Care – PPO

## 2017-10-22 VITALS — BP 157/62 | HR 83 | Temp 98.3°F | Resp 18 | Ht 63.0 in | Wt 221.3 lb

## 2017-10-22 DIAGNOSIS — Z5111 Encounter for antineoplastic chemotherapy: Secondary | ICD-10-CM | POA: Diagnosis not present

## 2017-10-22 DIAGNOSIS — C182 Malignant neoplasm of ascending colon: Secondary | ICD-10-CM

## 2017-10-22 DIAGNOSIS — C180 Malignant neoplasm of cecum: Secondary | ICD-10-CM

## 2017-10-22 MED ORDER — DEXAMETHASONE SODIUM PHOSPHATE 10 MG/ML IJ SOLN
INTRAMUSCULAR | Status: AC
  Filled 2017-10-22: qty 1

## 2017-10-22 MED ORDER — LIDOCAINE-PRILOCAINE 2.5-2.5 % EX CREA: TOPICAL_CREAM | CUTANEOUS | 2 refills | Status: DC

## 2017-10-22 MED ORDER — LEUCOVORIN CALCIUM INJECTION 350 MG
400.0000 mg/m2 | Freq: Once | INTRAVENOUS | Status: AC
  Administered 2017-10-22: 844 mg via INTRAVENOUS
  Filled 2017-10-22: qty 42.2

## 2017-10-22 MED ORDER — PALONOSETRON HCL INJECTION 0.25 MG/5ML
INTRAVENOUS | Status: AC
  Filled 2017-10-22: qty 5

## 2017-10-22 MED ORDER — FLUOROURACIL CHEMO INJECTION 2.5 GM/50ML
400.0000 mg/m2 | Freq: Once | INTRAVENOUS | Status: AC
  Administered 2017-10-22: 850 mg via INTRAVENOUS
  Filled 2017-10-22: qty 17

## 2017-10-22 MED ORDER — PROCHLORPERAZINE MALEATE 10 MG PO TABS: 10.0000 mg | ORAL_TABLET | Freq: Four times a day (QID) | ORAL | 2 refills | Status: DC | PRN

## 2017-10-22 MED ORDER — PALONOSETRON HCL INJECTION 0.25 MG/5ML
0.2500 mg | Freq: Once | INTRAVENOUS | Status: AC
  Administered 2017-10-22: 0.25 mg via INTRAVENOUS

## 2017-10-22 MED ORDER — HEPARIN SOD (PORK) LOCK FLUSH 100 UNIT/ML IV SOLN
500.0000 [IU] | Freq: Once | INTRAVENOUS | Status: DC | PRN
  Filled 2017-10-22: qty 5

## 2017-10-22 MED ORDER — DEXAMETHASONE SODIUM PHOSPHATE 10 MG/ML IJ SOLN
10.0000 mg | Freq: Once | INTRAMUSCULAR | Status: AC
  Administered 2017-10-22: 10 mg via INTRAVENOUS

## 2017-10-22 MED ORDER — SODIUM CHLORIDE 0.9% FLUSH
10.0000 mL | INTRAVENOUS | Status: DC | PRN
Start: 2017-10-22 — End: 2017-10-22
  Administered 2017-10-22: 10 mL
  Filled 2017-10-22: qty 10

## 2017-10-22 MED ORDER — SODIUM CHLORIDE 0.9 % IV SOLN
5000.0000 mg | INTRAVENOUS | Status: DC
  Administered 2017-10-22: 5000 mg via INTRAVENOUS
  Filled 2017-10-22: qty 100

## 2017-10-22 MED ORDER — OXALIPLATIN CHEMO INJECTION 100 MG/20ML
85.0000 mg/m2 | Freq: Once | INTRAVENOUS | Status: AC
  Administered 2017-10-22: 180 mg via INTRAVENOUS
  Filled 2017-10-22: qty 36

## 2017-10-22 MED ORDER — DEXTROSE 5 % IV SOLN
Freq: Once | INTRAVENOUS | Status: AC
  Administered 2017-10-22: 12:00:00 via INTRAVENOUS

## 2017-10-22 NOTE — Telephone Encounter (Signed)
Scheduled appt per 2/13 los - Gave patient AVS and calender per los.  

## 2017-10-22 NOTE — Progress Notes (Addendum)
  [facility] OFFICE PROGRESS NOTE   Diagnosis: Colon cancer  INTERVAL HISTORY:   Sharon Allison returns as scheduled.  She denies nausea.  She reports she is having "normal" bowel movements.  She denies pain.  No bleeding.  She has a good appetite.  Objective:  Vital signs in last 24 hours:  Blood pressure (!) 157/62, pulse 83, temperature 98.3 F (36.8 C), temperature source Oral, resp. rate 18, height '5\' 3"'$  (1.6 m), weight 221 lb 4.8 oz (100.4 kg), last menstrual period 07/10/2001, SpO2 99 %.    HEENT: No thrush or ulcers. Resp: Lungs clear bilaterally. Cardio: Regular rate and rhythm. GI: Abdomen soft and nontender.  No hepatomegaly.  Healed midline surgical incision. Vascular: No leg edema. Port-A-Cath with surrounding ecchymosis.  Lab Results:  Lab Results  Component Value Date   WBC 11.1 (H) 10/21/2017   HGB 11.6 (L) 09/25/2017   HCT 34.2 (L) 10/21/2017   MCV 90.5 10/21/2017   PLT 278 10/21/2017   NEUTROABS 8.4 (H) 10/21/2017    Imaging:  No results found.  Medications: I have reviewed the patient's current medications.  Assessment/Plan: 1. 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 MSI-stable ? 3/14 lymph nodes positive for metastatic carcinoma, lymphovascular invasion present ? Cycle 1 FOLFOX 10/22/2017  2.   Asthma  3.   Port-A-Cath placement 10/17/2017    Disposition: Sharon Allison appears stable.  Plan to proceed with cycle 1 FOLFOX today as scheduled.  She has attended the chemotherapy education class.  Potential toxicities again reviewed at today's visit and questions answered.  She will return for a follow-up visit and cycle 2 FOLFOX in 2 weeks.  She will contact the office in the interim with any problems.  Prescriptions sent to her pharmacy for Compazine and EMLA cream.  Patient seen with Dr. Benay Spice.  ECOG performance status measured at 1; life expectancy is greater than 24  months.  Ned Card ANP/GNP-BC   10/22/2017  11:29 AM  This was a shared visit with Ned Card.  Sharon Allison will begin adjuvant chemotherapy today.  She has attended a chemotherapy teaching class.   Julieanne Manson, MD

## 2017-10-22 NOTE — Patient Instructions (Signed)
[place] Discharge Instructions for Patients Receiving Chemotherapy  Today you received the following chemotherapy agents oxaliplatin (Eloxatin), leucovorin, and fluorouracil (Adrucil/5FU).  To help prevent nausea and vomiting after your treatment, we encourage you to take your nausea medication as directed by Dr. Benay Spice.   If you develop nausea and vomiting that is not controlled by your nausea medication, call the clinic.   BELOW ARE SYMPTOMS THAT SHOULD BE REPORTED IMMEDIATELY:  *FEVER GREATER THAN 100.5 F  *CHILLS WITH OR WITHOUT FEVER  NAUSEA AND VOMITING THAT IS NOT CONTROLLED WITH YOUR NAUSEA MEDICATION  *UNUSUAL SHORTNESS OF BREATH  *UNUSUAL BRUISING OR BLEEDING  TENDERNESS IN MOUTH AND THROAT WITH OR WITHOUT PRESENCE OF ULCERS  *URINARY PROBLEMS  *BOWEL PROBLEMS  UNUSUAL RASH Items with * indicate a potential emergency and should be followed up as soon as possible.  Feel free to call the clinic should you have any questions or concerns. The clinic phone number is [phone].  Please show the [facility] at check-in to the Emergency Department and triage nurse.  Oxaliplatin Injection What is this medicine? OXALIPLATIN (ox AL i PLA tin) is a chemotherapy drug. It targets fast dividing cells, like cancer cells, and causes these cells to die. This medicine is used to treat cancers of the colon and rectum, and many other cancers. This medicine may be used for other purposes; ask your health care provider or pharmacist if you have questions. COMMON BRAND NAME(S): Eloxatin What should I tell my health care provider before I take this medicine? They need to know if you have any of these conditions: -kidney disease -an unusual or allergic reaction to oxaliplatin, other chemotherapy, other medicines, foods, dyes, or preservatives -pregnant or trying to get pregnant -breast-feeding How should I use this medicine? This drug is given as an  infusion into a vein. It is administered in a hospital or clinic by a specially trained health care professional. Talk to your pediatrician regarding the use of this medicine in children. Special care may be needed. Overdosage: If you think you have taken too much of this medicine contact a poison control center or emergency room at once. NOTE: This medicine is only for you. Do not share this medicine with others. What if I miss a dose? It is important not to miss a dose. Call your doctor or health care professional if you are unable to keep an appointment. What may interact with this medicine? -medicines to increase blood counts like filgrastim, pegfilgrastim, sargramostim -probenecid -some antibiotics like amikacin, gentamicin, neomycin, polymyxin B, streptomycin, tobramycin -zalcitabine Talk to your doctor or health care professional before taking any of these medicines: -acetaminophen -aspirin -ibuprofen -ketoprofen -naproxen This list may not describe all possible interactions. Give your health care provider a list of all the medicines, herbs, non-prescription drugs, or dietary supplements you use. Also tell them if you smoke, drink alcohol, or use illegal drugs. Some items may interact with your medicine. What should I watch for while using this medicine? Your condition will be monitored carefully while you are receiving this medicine. You will need important blood work done while you are taking this medicine. This medicine can make you more sensitive to cold. Do not drink cold drinks or use ice. Cover exposed skin before coming in contact with cold temperatures or cold objects. When out in cold weather wear warm clothing and cover your mouth and nose to warm the air that goes into your lungs. Tell your doctor if you  get sensitive to the cold. This drug may make you feel generally unwell. This is not uncommon, as chemotherapy can affect healthy cells as well as cancer cells. Report any  side effects. Continue your course of treatment even though you feel ill unless your doctor tells you to stop. In some cases, you may be given additional medicines to help with side effects. Follow all directions for their use. Call your doctor or health care professional for advice if you get a fever, chills or sore throat, or other symptoms of a cold or flu. Do not treat yourself. This drug decreases your body's ability to fight infections. Try to avoid being around people who are sick. This medicine may increase your risk to bruise or bleed. Call your doctor or health care professional if you notice any unusual bleeding. Be careful brushing and flossing your teeth or using a toothpick because you may get an infection or bleed more easily. If you have any dental work done, tell your dentist you are receiving this medicine. Avoid taking products that contain aspirin, acetaminophen, ibuprofen, naproxen, or ketoprofen unless instructed by your doctor. These medicines may hide a fever. Do not become pregnant while taking this medicine. Women should inform their doctor if they wish to become pregnant or think they might be pregnant. There is a potential for serious side effects to an unborn child. Talk to your health care professional or pharmacist for more information. Do not breast-feed an infant while taking this medicine. Call your doctor or health care professional if you get diarrhea. Do not treat yourself. What side effects may I notice from receiving this medicine? Side effects that you should report to your doctor or health care professional as soon as possible: -allergic reactions like skin rash, itching or hives, swelling of the face, lips, or tongue -low blood counts - This drug may decrease the number of white blood cells, red blood cells and platelets. You may be at increased risk for infections and bleeding. -signs of infection - fever or chills, cough, sore throat, pain or difficulty passing  urine -signs of decreased platelets or bleeding - bruising, pinpoint red spots on the skin, black, tarry stools, nosebleeds -signs of decreased red blood cells - unusually weak or tired, fainting spells, lightheadedness -breathing problems -chest pain, pressure -cough -diarrhea -jaw tightness -mouth sores -nausea and vomiting -pain, swelling, redness or irritation at the injection site -pain, tingling, numbness in the hands or feet -problems with balance, talking, walking -redness, blistering, peeling or loosening of the skin, including inside the mouth -trouble passing urine or change in the amount of urine Side effects that usually do not require medical attention (report to your doctor or health care professional if they continue or are bothersome): -changes in vision -constipation -hair loss -loss of appetite -metallic taste in the mouth or changes in taste -stomach pain This list may not describe all possible side effects. Call your doctor for medical advice about side effects. You may report side effects to FDA at 1-800-FDA-1088. Where should I keep my medicine? This drug is given in a hospital or clinic and will not be stored at home. NOTE: This sheet is a summary. It may not cover all possible information. If you have questions about this medicine, talk to your doctor, pharmacist, or health care provider.  2018 Elsevier/Gold Standard (2008-03-22 17:22:47)  Leucovorin injection What is this medicine? LEUCOVORIN (loo koe VOR in) is used to prevent or treat the harmful effects of some medicines. This  medicine is used to treat anemia caused by a low amount of folic acid in the body. It is also used with 5-fluorouracil (5-FU) to treat colon cancer. This medicine may be used for other purposes; ask your health care provider or pharmacist if you have questions. What should I tell my health care provider before I take this medicine? They need to know if you have any of these  conditions: -anemia from low levels of vitamin B-12 in the blood -an unusual or allergic reaction to leucovorin, folic acid, other medicines, foods, dyes, or preservatives -pregnant or trying to get pregnant -breast-feeding How should I use this medicine? This medicine is for injection into a muscle or into a vein. It is given by a health care professional in a hospital or clinic setting. Talk to your pediatrician regarding the use of this medicine in children. Special care may be needed. Overdosage: If you think you have taken too much of this medicine contact a poison control center or emergency room at once. NOTE: This medicine is only for you. Do not share this medicine with others. What if I miss a dose? This does not apply. What may interact with this medicine? -capecitabine -fluorouracil -phenobarbital -phenytoin -primidone -trimethoprim-sulfamethoxazole This list may not describe all possible interactions. Give your health care provider a list of all the medicines, herbs, non-prescription drugs, or dietary supplements you use. Also tell them if you smoke, drink alcohol, or use illegal drugs. Some items may interact with your medicine. What should I watch for while using this medicine? Your condition will be monitored carefully while you are receiving this medicine. This medicine may increase the side effects of 5-fluorouracil, 5-FU. Tell your doctor or health care professional if you have diarrhea or mouth sores that do not get better or that get worse. What side effects may I notice from receiving this medicine? Side effects that you should report to your doctor or health care professional as soon as possible: -allergic reactions like skin rash, itching or hives, swelling of the face, lips, or tongue -breathing problems -fever, infection -mouth sores -unusual bleeding or bruising -unusually weak or tired Side effects that usually do not require medical attention (report to  your doctor or health care professional if they continue or are bothersome): -constipation or diarrhea -loss of appetite -nausea, vomiting This list may not describe all possible side effects. Call your doctor for medical advice about side effects. You may report side effects to FDA at 1-800-FDA-1088. Where should I keep my medicine? This drug is given in a hospital or clinic and will not be stored at home. NOTE: This sheet is a summary. It may not cover all possible information. If you have questions about this medicine, talk to your doctor, pharmacist, or health care provider.  2018 Elsevier/Gold Standard (2008-03-01 16:50:29)  Fluorouracil, 5-FU injection What is this medicine? FLUOROURACIL, 5-FU (flure oh YOOR a sil) is a chemotherapy drug. It slows the growth of cancer cells. This medicine is used to treat many types of cancer like breast cancer, colon or rectal cancer, pancreatic cancer, and stomach cancer. This medicine may be used for other purposes; ask your health care provider or pharmacist if you have questions. COMMON BRAND NAME(S): Adrucil What should I tell my health care provider before I take this medicine? They need to know if you have any of these conditions: -blood disorders -dihydropyrimidine dehydrogenase (DPD) deficiency -infection (especially a virus infection such as chickenpox, cold sores, or herpes) -kidney disease -liver disease -  malnourished, poor nutrition -recent or ongoing radiation therapy -an unusual or allergic reaction to fluorouracil, other chemotherapy, other medicines, foods, dyes, or preservatives -pregnant or trying to get pregnant -breast-feeding How should I use this medicine? This drug is given as an infusion or injection into a vein. It is administered in a hospital or clinic by a specially trained health care professional. Talk to your pediatrician regarding the use of this medicine in children. Special care may be needed. Overdosage: If  you think you have taken too much of this medicine contact a poison control center or emergency room at once. NOTE: This medicine is only for you. Do not share this medicine with others. What if I miss a dose? It is important not to miss your dose. Call your doctor or health care professional if you are unable to keep an appointment. What may interact with this medicine? -allopurinol -cimetidine -dapsone -digoxin -hydroxyurea -leucovorin -levamisole -medicines for seizures like ethotoin, fosphenytoin, phenytoin -medicines to increase blood counts like filgrastim, pegfilgrastim, sargramostim -medicines that treat or prevent blood clots like warfarin, enoxaparin, and dalteparin -methotrexate -metronidazole -pyrimethamine -some other chemotherapy drugs like busulfan, cisplatin, estramustine, vinblastine -trimethoprim -trimetrexate -vaccines Talk to your doctor or health care professional before taking any of these medicines: -acetaminophen -aspirin -ibuprofen -ketoprofen -naproxen This list may not describe all possible interactions. Give your health care provider a list of all the medicines, herbs, non-prescription drugs, or dietary supplements you use. Also tell them if you smoke, drink alcohol, or use illegal drugs. Some items may interact with your medicine. What should I watch for while using this medicine? Visit your doctor for checks on your progress. This drug may make you feel generally unwell. This is not uncommon, as chemotherapy can affect healthy cells as well as cancer cells. Report any side effects. Continue your course of treatment even though you feel ill unless your doctor tells you to stop. In some cases, you may be given additional medicines to help with side effects. Follow all directions for their use. Call your doctor or health care professional for advice if you get a fever, chills or sore throat, or other symptoms of a cold or flu. Do not treat yourself. This  drug decreases your body's ability to fight infections. Try to avoid being around people who are sick. This medicine may increase your risk to bruise or bleed. Call your doctor or health care professional if you notice any unusual bleeding. Be careful brushing and flossing your teeth or using a toothpick because you may get an infection or bleed more easily. If you have any dental work done, tell your dentist you are receiving this medicine. Avoid taking products that contain aspirin, acetaminophen, ibuprofen, naproxen, or ketoprofen unless instructed by your doctor. These medicines may hide a fever. Do not become pregnant while taking this medicine. Women should inform their doctor if they wish to become pregnant or think they might be pregnant. There is a potential for serious side effects to an unborn child. Talk to your health care professional or pharmacist for more information. Do not breast-feed an infant while taking this medicine. Men should inform their doctor if they wish to father a child. This medicine may lower sperm counts. Do not treat diarrhea with over the counter products. Contact your doctor if you have diarrhea that lasts more than 2 days or if it is severe and watery. This medicine can make you more sensitive to the sun. Keep out of the sun. If you cannot  avoid being in the sun, wear protective clothing and use sunscreen. Do not use sun lamps or tanning beds/booths. What side effects may I notice from receiving this medicine? Side effects that you should report to your doctor or health care professional as soon as possible: -allergic reactions like skin rash, itching or hives, swelling of the face, lips, or tongue -low blood counts - this medicine may decrease the number of white blood cells, red blood cells and platelets. You may be at increased risk for infections and bleeding. -signs of infection - fever or chills, cough, sore throat, pain or difficulty passing urine -signs of  decreased platelets or bleeding - bruising, pinpoint red spots on the skin, black, tarry stools, blood in the urine -signs of decreased red blood cells - unusually weak or tired, fainting spells, lightheadedness -breathing problems -changes in vision -chest pain -mouth sores -nausea and vomiting -pain, swelling, redness at site where injected -pain, tingling, numbness in the hands or feet -redness, swelling, or sores on hands or feet -stomach pain -unusual bleeding Side effects that usually do not require medical attention (report to your doctor or health care professional if they continue or are bothersome): -changes in finger or toe nails -diarrhea -dry or itchy skin -hair loss -headache -loss of appetite -sensitivity of eyes to the light -stomach upset -unusually teary eyes This list may not describe all possible side effects. Call your doctor for medical advice about side effects. You may report side effects to FDA at 1-800-FDA-1088. Where should I keep my medicine? This drug is given in a hospital or clinic and will not be stored at home. NOTE: This sheet is a summary. It may not cover all possible information. If you have questions about this medicine, talk to your doctor, pharmacist, or health care provider.  2018 Elsevier/Gold Standard (2007-12-30 13:53:16)

## 2017-10-22 NOTE — Progress Notes (Signed)
  Oncology Nurse Navigator Documentation  Navigator Location: [facility] (10/22/17 1240) Referral date to RadOnc/MedOnc: 10/06/17 (10/22/17 1240) )Navigator Encounter Type: Other(Infusion Room) (10/22/17 1240)   Abnormal Finding Date: 05/21/17 (10/22/17 1240) Confirmed Diagnosis Date: 09/18/17 (10/22/17 1240) Surgery Date: 09/18/17 (10/22/17 1240)           Treatment Initiated Date: 09/18/17 (10/22/17 1240) Patient Visit Type: Other(Infusion Room) (10/22/17 1240) Treatment Phase: First Chemo Tx (10/22/17 1240) Barriers/Navigation Needs: No barriers at this time;No Questions;No Needs (10/22/17 1240)   Interventions: Psycho-social support (10/22/17 1240)  Met with patient in infusion room to offer support and encouragement.          Acuity: Level 1 (10/22/17 1240)         Time Spent with Patient: 30 (10/22/17 1240)

## 2017-10-23 ENCOUNTER — Telehealth: Payer: Self-pay | Admitting: *Deleted

## 2017-10-23 NOTE — Telephone Encounter (Signed)
TCT patient to follow up on first time Chemotherapy of oxaliplatin.Leucovorin and 5FU. Spoke with patient.  She states she feels well. No nausea, vomiting or diarrhea. Denies fever/chills.   She is not havingany difficulties with pump. She is using appropriate precautions for cold sensistivity.  She is aware of her appointment tomorrow to remove pump and de-access her port.

## 2017-10-23 NOTE — Telephone Encounter (Signed)
-----   Message from Johann Capers, RN sent at 10/22/2017  5:30 PM EST ----- Regarding: Manley Mason F/U Benay Spice - first time oxali/leuco/5FU chemo call

## 2017-10-24 ENCOUNTER — Inpatient Hospital Stay: Payer: BC Managed Care – PPO

## 2017-10-24 ENCOUNTER — Ambulatory Visit (HOSPITAL_COMMUNITY): Payer: BC Managed Care – PPO

## 2017-10-24 VITALS — BP 145/84 | HR 59 | Temp 97.6°F | Resp 17

## 2017-10-24 DIAGNOSIS — Z5111 Encounter for antineoplastic chemotherapy: Secondary | ICD-10-CM | POA: Diagnosis not present

## 2017-10-24 DIAGNOSIS — C182 Malignant neoplasm of ascending colon: Secondary | ICD-10-CM

## 2017-10-24 MED ORDER — SODIUM CHLORIDE 0.9% FLUSH
10.0000 mL | INTRAVENOUS | Status: DC | PRN
  Administered 2017-10-24: 10 mL
  Filled 2017-10-24: qty 10

## 2017-10-24 MED ORDER — HEPARIN SOD (PORK) LOCK FLUSH 100 UNIT/ML IV SOLN
500.0000 [IU] | Freq: Once | INTRAVENOUS | Status: AC | PRN
  Administered 2017-10-24: 500 [IU]
  Filled 2017-10-24: qty 5

## 2017-10-24 NOTE — Patient Instructions (Signed)
Implanted Port Home Guide An implanted port is a type of central line that is placed under the skin. Central lines are used to provide IV access when treatment or nutrition needs to be given through a person's veins. Implanted ports are used for long-term IV access. An implanted port may be placed because:  You need IV medicine that would be irritating to the small veins in your hands or arms.  You need long-term IV medicines, such as antibiotics.  You need IV nutrition for a long period.  You need frequent blood draws for lab tests.  You need dialysis.  Implanted ports are usually placed in the chest area, but they can also be placed in the upper arm, the abdomen, or the leg. An implanted port has two main parts:  Reservoir. The reservoir is round and will appear as a small, raised area under your skin. The reservoir is the part where a needle is inserted to give medicines or draw blood.  Catheter. The catheter is a thin, flexible tube that extends from the reservoir. The catheter is placed into a large vein. Medicine that is inserted into the reservoir goes into the catheter and then into the vein.  How will I care for my incision site? Do not get the incision site wet. Bathe or shower as directed by your health care provider. How is my port accessed? Special steps must be taken to access the port:  Before the port is accessed, a numbing cream can be placed on the skin. This helps numb the skin over the port site.  Your health care provider uses a sterile technique to access the port. ? Your health care provider must put on a mask and sterile gloves. ? The skin over your port is cleaned carefully with an antiseptic and allowed to dry. ? The port is gently pinched between sterile gloves, and a needle is inserted into the port.  Only "non-coring" port needles should be used to access the port. Once the port is accessed, a blood return should be checked. This helps ensure that the port  is in the vein and is not clogged.  If your port needs to remain accessed for a constant infusion, a clear (transparent) bandage will be placed over the needle site. The bandage and needle will need to be changed every week, or as directed by your health care provider.  Keep the bandage covering the needle clean and dry. Do not get it wet. Follow your health care provider's instructions on how to take a shower or bath while the port is accessed.  If your port does not need to stay accessed, no bandage is needed over the port.  What is flushing? Flushing helps keep the port from getting clogged. Follow your health care provider's instructions on how and when to flush the port. Ports are usually flushed with saline solution or a medicine called heparin. The need for flushing will depend on how the port is used.  If the port is used for intermittent medicines or blood draws, the port will need to be flushed: ? After medicines have been given. ? After blood has been drawn. ? As part of routine maintenance.  If a constant infusion is running, the port may not need to be flushed.  How long will my port stay implanted? The port can stay in for as long as your health care provider thinks it is needed. When it is time for the port to come out, surgery will be   done to remove it. The procedure is similar to the one performed when the port was put in. When should I seek immediate medical care? When you have an implanted port, you should seek immediate medical care if:  You notice a bad smell coming from the incision site.  You have swelling, redness, or drainage at the incision site.  You have more swelling or pain at the port site or the surrounding area.  You have a fever that is not controlled with medicine.  This information is not intended to replace advice given to you by your health care provider. Make sure you discuss any questions you have with your health care provider. Document  Released: 08/26/2005 Document Revised: 02/01/2016 Document Reviewed: 05/03/2013 Elsevier Interactive Patient Education  2017 [employer]  

## 2017-10-27 ENCOUNTER — Ambulatory Visit (HOSPITAL_COMMUNITY)
Admission: RE | Admit: 2017-10-27 | Discharge: 2017-10-27 | Disposition: A | Discharge status: Home / Self Care | Payer: BC Managed Care – PPO | Source: Ambulatory Visit | Attending: Surgery | Admitting: Surgery

## 2017-10-27 ENCOUNTER — Encounter (HOSPITAL_COMMUNITY): Payer: Self-pay | Admitting: Radiology

## 2017-10-27 DIAGNOSIS — Z90710 Acquired absence of both cervix and uterus: Secondary | ICD-10-CM | POA: Diagnosis not present

## 2017-10-27 DIAGNOSIS — C189 Malignant neoplasm of colon, unspecified: Secondary | ICD-10-CM | POA: Insufficient documentation

## 2017-10-27 DIAGNOSIS — Z9049 Acquired absence of other specified parts of digestive tract: Secondary | ICD-10-CM | POA: Diagnosis not present

## 2017-10-27 MED ORDER — IOPAMIDOL (ISOVUE-300) INJECTION 61%
INTRAVENOUS | Status: AC
  Filled 2017-10-27: qty 100

## 2017-10-27 MED ORDER — IOPAMIDOL (ISOVUE-300) INJECTION 61%
100.0000 mL | Freq: Once | INTRAVENOUS | Status: AC | PRN
  Administered 2017-10-27: 100 mL via INTRAVENOUS

## 2017-11-02 ENCOUNTER — Other Ambulatory Visit: Payer: Self-pay | Admitting: Oncology

## 2017-11-05 ENCOUNTER — Inpatient Hospital Stay: Payer: BC Managed Care – PPO

## 2017-11-05 ENCOUNTER — Encounter: Payer: Self-pay | Admitting: Oncology

## 2017-11-05 ENCOUNTER — Inpatient Hospital Stay (HOSPITAL_BASED_OUTPATIENT_CLINIC_OR_DEPARTMENT_OTHER): Payer: BC Managed Care – PPO | Admitting: Oncology

## 2017-11-05 ENCOUNTER — Telehealth: Payer: Self-pay | Admitting: Oncology

## 2017-11-05 VITALS — BP 152/69 | HR 79 | Temp 97.9°F | Resp 18 | Ht 63.0 in | Wt 219.0 lb

## 2017-11-05 DIAGNOSIS — C180 Malignant neoplasm of cecum: Secondary | ICD-10-CM | POA: Diagnosis not present

## 2017-11-05 DIAGNOSIS — C182 Malignant neoplasm of ascending colon: Secondary | ICD-10-CM

## 2017-11-05 DIAGNOSIS — Z5111 Encounter for antineoplastic chemotherapy: Secondary | ICD-10-CM | POA: Diagnosis not present

## 2017-11-05 LAB — CBC WITH DIFFERENTIAL (CANCER CENTER ONLY)
BASOS ABS: 0 10*3/uL (ref 0.0–0.1)
BASOS PCT: 1 %
EOS ABS: 0.1 10*3/uL (ref 0.0–0.5)
EOS PCT: 2 %
HEMATOCRIT: 34.4 % — AB (ref 34.8–46.6)
Hemoglobin: 11.3 g/dL — ABNORMAL LOW (ref 11.6–15.9)
Lymphocytes Relative: 31 %
Lymphs Abs: 1.3 10*3/uL (ref 0.9–3.3)
MCH: 29.7 pg (ref 25.1–34.0)
MCHC: 32.9 g/dL (ref 31.5–36.0)
MCV: 90.1 fL (ref 79.5–101.0)
MONO ABS: 0.6 10*3/uL (ref 0.1–0.9)
Monocytes Relative: 13 %
NEUTROS ABS: 2.2 10*3/uL (ref 1.5–6.5)
Neutrophils Relative %: 53 %
PLATELETS: 242 10*3/uL (ref 145–400)
RBC: 3.82 MIL/uL (ref 3.70–5.45)
RDW: 14.4 % (ref 11.2–14.5)
WBC Count: 4.2 10*3/uL (ref 3.9–10.3)

## 2017-11-05 LAB — CMP (CANCER CENTER ONLY)
ALBUMIN: 3.6 g/dL (ref 3.5–5.0)
ALK PHOS: 90 U/L (ref 40–150)
ALT: 24 U/L (ref 0–55)
ANION GAP: 9 (ref 3–11)
AST: 21 U/L (ref 5–34)
BILIRUBIN TOTAL: 0.6 mg/dL (ref 0.2–1.2)
BUN: 10 mg/dL (ref 7–26)
CALCIUM: 10.6 mg/dL — AB (ref 8.4–10.4)
CO2: 25 mmol/L (ref 22–29)
Chloride: 106 mmol/L (ref 98–109)
Creatinine: 0.72 mg/dL (ref 0.60–1.10)
GFR, Est AFR Am: >60 mL/min (ref >60)
GFR, Estimated: >60 mL/min (ref >60)
GLUCOSE: 112 mg/dL (ref 70–140)
Potassium: 3.5 mmol/L (ref 3.5–5.1)
Sodium: 140 mmol/L (ref 136–145)
TOTAL PROTEIN: 6.6 g/dL (ref 6.4–8.3)

## 2017-11-05 MED ORDER — FLUOROURACIL CHEMO INJECTION 2.5 GM/50ML
400.0000 mg/m2 | Freq: Once | INTRAVENOUS | Status: AC
  Administered 2017-11-05: 850 mg via INTRAVENOUS
  Filled 2017-11-05: qty 17

## 2017-11-05 MED ORDER — DEXTROSE 5 % IV SOLN
Freq: Once | INTRAVENOUS | Status: AC
  Administered 2017-11-05: 12:00:00 via INTRAVENOUS

## 2017-11-05 MED ORDER — LEUCOVORIN CALCIUM INJECTION 350 MG
400.0000 mg/m2 | Freq: Once | INTRAMUSCULAR | Status: AC
  Administered 2017-11-05: 844 mg via INTRAVENOUS
  Filled 2017-11-05: qty 42.2

## 2017-11-05 MED ORDER — DEXAMETHASONE SODIUM PHOSPHATE 10 MG/ML IJ SOLN
10.0000 mg | Freq: Once | INTRAMUSCULAR | Status: AC
  Administered 2017-11-05: 10 mg via INTRAVENOUS

## 2017-11-05 MED ORDER — DEXTROSE 5 % IV SOLN
85.0000 mg/m2 | Freq: Once | INTRAVENOUS | Status: AC
  Administered 2017-11-05: 180 mg via INTRAVENOUS
  Filled 2017-11-05: qty 36

## 2017-11-05 MED ORDER — FLUOROURACIL CHEMO INJECTION 5 GM/100ML
5000.0000 mg | INTRAVENOUS | Status: DC
  Administered 2017-11-05: 5000 mg via INTRAVENOUS
  Filled 2017-11-05: qty 100

## 2017-11-05 MED ORDER — PALONOSETRON HCL INJECTION 0.25 MG/5ML
0.2500 mg | Freq: Once | INTRAVENOUS | Status: AC
  Administered 2017-11-05: 0.25 mg via INTRAVENOUS

## 2017-11-05 MED ORDER — PALONOSETRON HCL INJECTION 0.25 MG/5ML
INTRAVENOUS | Status: AC
  Filled 2017-11-05: qty 5

## 2017-11-05 MED ORDER — DEXAMETHASONE SODIUM PHOSPHATE 10 MG/ML IJ SOLN
INTRAMUSCULAR | Status: AC
  Filled 2017-11-05: qty 1

## 2017-11-05 NOTE — Progress Notes (Signed)
  [place] OFFICE PROGRESS NOTE   Diagnosis: Colon cancer  INTERVAL HISTORY:   Sharon Allison returns as scheduled.  She completed cycle 1 FOLFOX on 10/22/2017.  No nausea/vomiting, mouth sores, diarrhea, or hand/foot pain.  She noticed mild cold sensitivity on one occasion.   Objective:  Vital signs in last 24 hours:  Blood pressure (!) 152/69, pulse 79, temperature 97.9 F (36.6 C), temperature source Oral, resp. rate 18, height '5\' 3"'$  (1.6 m), weight 219 lb (99.3 kg), last menstrual period 07/10/2001, SpO2 99 %.    HEENT: No thrush or ulcers Resp: Lungs clear bilaterally Cardio: Regular rate and rhythm GI: No hepatosplenomegaly, nontender, healed midline incision Vascular: No leg edema Skin: Palms without erythema  Portacath/PICC-without erythema  Lab Results:  Lab Results  Component Value Date   WBC 4.2 11/05/2017   HGB 11.6 (L) 09/25/2017   HCT 34.4 (L) 11/05/2017   MCV 90.1 11/05/2017   PLT 242 11/05/2017   NEUTROABS 2.2 11/05/2017    CMP     Component Value Date/Time   NA 140 10/21/2017 1124   NA 138 04/04/2017 1009   K 3.4 (L) 10/21/2017 1124   CL 103 10/21/2017 1124   CO2 27 10/21/2017 1124   GLUCOSE 115 10/21/2017 1124   BUN 8 10/21/2017 1124   BUN 13 04/04/2017 1009   CREATININE 0.71 10/21/2017 1124   CREATININE 0.62 03/29/2016 1602   CALCIUM 10.4 10/21/2017 1124   PROT 6.6 10/21/2017 1124   PROT 6.7 04/04/2017 1009   ALBUMIN 3.7 10/21/2017 1124   ALBUMIN 4.3 04/04/2017 1009   AST 12 10/21/2017 1124   ALT 15 10/21/2017 1124   ALKPHOS 83 10/21/2017 1124   BILITOT 0.6 10/21/2017 1124   GFRNONAA >60 10/21/2017 1124   GFRAA >60 10/21/2017 1124    Lab Results  Component Value Date   CEA1 <1.00 10/21/2017     Medications: I have reviewed the patient's current medications.   Assessment/Plan: 1. 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 MSI-stable ? 3/14 lymph nodes positive  for metastatic carcinoma, lymphovascular invasion present ? Cycle 1 FOLFOX 10/22/2017 ? CTs chest, abdomen, and pelvis 10/28/2017- negative for metastatic disease ? Cycle 2 FOLFOX 11/05/2017  2.Asthma  3.   Port-A-Cath placement 10/17/2017       Disposition: Sharon Allison tolerated the first cycle of FOLFOX well.  The plan is to proceed with cycle 2 today.  She will return for an office visit and cycle 3 chemotherapy in 2 weeks.  15 minutes were spent with the patient today.  The majority of the time was used for counseling and coordination of care.  Betsy Coder, MD  11/05/2017  11:10 AM

## 2017-11-05 NOTE — Telephone Encounter (Signed)
Scheduled apt per 2/27 los - patient to get an updated schedule in the tx area.

## 2017-11-05 NOTE — Progress Notes (Signed)
Met w/ pt to introduce myself as her Financial Resource Specialist.  Unfortunately there aren't any foundations offering copay assistance for her Dx.  I offered the [employer] grant, went over what it covers and gave her an expense sheet.  Pt would like to apply so she will bring her proof of income on 11/07/17.  She has my card for any questions or concerns she may have in the future.  °

## 2017-11-05 NOTE — Patient Instructions (Signed)
[facility] Discharge Instructions for Patients Receiving Chemotherapy  Today you received the following chemotherapy agents Oxaliplatin, Leucovorin, Fluorouracil.   To help prevent nausea and vomiting after your treatment, we encourage you to take your nausea medication as directed.  If you develop nausea and vomiting that is not controlled by your nausea medication, call the clinic.   BELOW ARE SYMPTOMS THAT SHOULD BE REPORTED IMMEDIATELY:  *FEVER GREATER THAN 100.5 F  *CHILLS WITH OR WITHOUT FEVER  NAUSEA AND VOMITING THAT IS NOT CONTROLLED WITH YOUR NAUSEA MEDICATION  *UNUSUAL SHORTNESS OF BREATH  *UNUSUAL BRUISING OR BLEEDING  TENDERNESS IN MOUTH AND THROAT WITH OR WITHOUT PRESENCE OF ULCERS  *URINARY PROBLEMS  *BOWEL PROBLEMS  UNUSUAL RASH Items with * indicate a potential emergency and should be followed up as soon as possible.  Feel free to call the clinic should you have any questions or concerns. The clinic phone number is [phone].  Please show the CHEMO ALERT CARD at check-in to the Emergency Department and triage nurse.   

## 2017-11-06 ENCOUNTER — Ambulatory Visit (HOSPITAL_BASED_OUTPATIENT_CLINIC_OR_DEPARTMENT_OTHER): Payer: BC Managed Care – PPO | Attending: Allergy | Admitting: Internal Medicine

## 2017-11-06 DIAGNOSIS — G4733 Obstructive sleep apnea (adult) (pediatric): Secondary | ICD-10-CM | POA: Diagnosis not present

## 2017-11-06 DIAGNOSIS — R0683 Snoring: Secondary | ICD-10-CM | POA: Diagnosis not present

## 2017-11-07 ENCOUNTER — Inpatient Hospital Stay: Payer: BC Managed Care – PPO | Attending: Oncology

## 2017-11-07 ENCOUNTER — Encounter: Payer: Self-pay | Admitting: Oncology

## 2017-11-07 VITALS — BP 148/78 | HR 77 | Temp 98.1°F | Resp 18

## 2017-11-07 DIAGNOSIS — Z5111 Encounter for antineoplastic chemotherapy: Secondary | ICD-10-CM | POA: Insufficient documentation

## 2017-11-07 DIAGNOSIS — D701 Agranulocytosis secondary to cancer chemotherapy: Secondary | ICD-10-CM | POA: Insufficient documentation

## 2017-11-07 DIAGNOSIS — C180 Malignant neoplasm of cecum: Secondary | ICD-10-CM | POA: Diagnosis present

## 2017-11-07 DIAGNOSIS — J45909 Unspecified asthma, uncomplicated: Secondary | ICD-10-CM | POA: Insufficient documentation

## 2017-11-07 DIAGNOSIS — C182 Malignant neoplasm of ascending colon: Secondary | ICD-10-CM

## 2017-11-07 MED ORDER — SODIUM CHLORIDE 0.9% FLUSH
10.0000 mL | INTRAVENOUS | Status: DC | PRN
  Administered 2017-11-07: 10 mL
  Filled 2017-11-07: qty 10

## 2017-11-07 MED ORDER — HEPARIN SOD (PORK) LOCK FLUSH 100 UNIT/ML IV SOLN
500.0000 [IU] | Freq: Once | INTRAVENOUS | Status: AC | PRN
  Administered 2017-11-07: 500 [IU]
  Filled 2017-11-07: qty 5

## 2017-11-07 NOTE — Progress Notes (Signed)
Pt is approved for the $400 [employer] grant.  °

## 2017-11-13 ENCOUNTER — Other Ambulatory Visit: Payer: Self-pay | Admitting: Oncology

## 2017-11-19 ENCOUNTER — Inpatient Hospital Stay: Payer: BC Managed Care – PPO

## 2017-11-19 ENCOUNTER — Encounter: Payer: Self-pay | Admitting: Nurse Practitioner

## 2017-11-19 ENCOUNTER — Telehealth: Payer: Self-pay | Admitting: Nurse Practitioner

## 2017-11-19 ENCOUNTER — Inpatient Hospital Stay (HOSPITAL_BASED_OUTPATIENT_CLINIC_OR_DEPARTMENT_OTHER): Payer: BC Managed Care – PPO | Admitting: Nurse Practitioner

## 2017-11-19 ENCOUNTER — Other Ambulatory Visit: Payer: BC Managed Care – PPO

## 2017-11-19 VITALS — BP 165/80 | HR 74 | Temp 98.4°F | Resp 20 | Ht 63.0 in | Wt 219.1 lb

## 2017-11-19 DIAGNOSIS — D701 Agranulocytosis secondary to cancer chemotherapy: Secondary | ICD-10-CM

## 2017-11-19 DIAGNOSIS — J45909 Unspecified asthma, uncomplicated: Secondary | ICD-10-CM

## 2017-11-19 DIAGNOSIS — C182 Malignant neoplasm of ascending colon: Secondary | ICD-10-CM

## 2017-11-19 DIAGNOSIS — Z95828 Presence of other vascular implants and grafts: Secondary | ICD-10-CM

## 2017-11-19 DIAGNOSIS — Z5111 Encounter for antineoplastic chemotherapy: Secondary | ICD-10-CM | POA: Diagnosis not present

## 2017-11-19 DIAGNOSIS — G4733 Obstructive sleep apnea (adult) (pediatric): Secondary | ICD-10-CM

## 2017-11-19 DIAGNOSIS — C180 Malignant neoplasm of cecum: Secondary | ICD-10-CM

## 2017-11-19 LAB — CBC WITH DIFFERENTIAL (CANCER CENTER ONLY)
BASOS ABS: 0 10*3/uL (ref 0.0–0.1)
BASOS PCT: 1 %
EOS PCT: 5 %
Eosinophils Absolute: 0.1 10*3/uL (ref 0.0–0.5)
HCT: 33.2 % — ABNORMAL LOW (ref 34.8–46.6)
Hemoglobin: 11.1 g/dL — ABNORMAL LOW (ref 11.6–15.9)
Lymphocytes Relative: 42 %
Lymphs Abs: 0.9 10*3/uL (ref 0.9–3.3)
MCH: 30.4 pg (ref 25.1–34.0)
MCHC: 33.4 g/dL (ref 31.5–36.0)
MCV: 91 fL (ref 79.5–101.0)
MONO ABS: 0.4 10*3/uL (ref 0.1–0.9)
Monocytes Relative: 19 %
NEUTROS ABS: 0.7 10*3/uL — AB (ref 1.5–6.5)
Neutrophils Relative %: 33 %
PLATELETS: 132 10*3/uL — AB (ref 145–400)
RBC: 3.64 MIL/uL — AB (ref 3.70–5.45)
RDW: 15.1 % — AB (ref 11.2–14.5)
WBC: 2 10*3/uL — AB (ref 3.9–10.3)

## 2017-11-19 LAB — CMP (CANCER CENTER ONLY)
ALT: 26 U/L (ref 0–55)
AST: 23 U/L (ref 5–34)
Albumin: 3.4 g/dL — ABNORMAL LOW (ref 3.5–5.0)
Alkaline Phosphatase: 107 U/L (ref 40–150)
Anion gap: 8 (ref 3–11)
BUN: 12 mg/dL (ref 7–26)
CHLORIDE: 106 mmol/L (ref 98–109)
CO2: 25 mmol/L (ref 22–29)
Calcium: 10.5 mg/dL — ABNORMAL HIGH (ref 8.4–10.4)
Creatinine: 0.73 mg/dL (ref 0.60–1.10)
GFR, Est AFR Am: >60 mL/min (ref >60)
GFR, Estimated: >60 mL/min (ref >60)
GLUCOSE: 120 mg/dL (ref 70–140)
POTASSIUM: 3.2 mmol/L — AB (ref 3.5–5.1)
Sodium: 139 mmol/L (ref 136–145)
Total Bilirubin: 0.4 mg/dL (ref 0.2–1.2)
Total Protein: 6.4 g/dL (ref 6.4–8.3)

## 2017-11-19 MED ORDER — HEPARIN SOD (PORK) LOCK FLUSH 100 UNIT/ML IV SOLN
500.0000 [IU] | Freq: Once | INTRAVENOUS | Status: AC
  Administered 2017-11-19: 500 [IU]
  Filled 2017-11-19: qty 5

## 2017-11-19 MED ORDER — SODIUM CHLORIDE 0.9% FLUSH
10.0000 mL | Freq: Once | INTRAVENOUS | Status: AC
  Administered 2017-11-19: 10 mL
  Filled 2017-11-19: qty 10

## 2017-11-19 NOTE — Procedures (Signed)
Patient Name: Sharon Allison, Sharon Allison Date: 11/06/2017 Gender: Female D.O.B: August 10, 1956 Age (years): 77 Referring Provider: Prudy Feeler Height (inches): 63 Interpreting Physician: Baird Lyons MD, ABSM Weight (lbs): 222 RPSGT: Lanae Boast BMI: 39 MRN: [id number] Neck Size: 14.50  CLINICAL INFORMATION Sleep Study Type: Split Night CPAP  Indication for sleep study: OSA  Epworth Sleepiness Score: 16  SLEEP STUDY TECHNIQUE As per the AASM Manual for the Scoring of Sleep and Associated Events v2.3 (April 2016) with a hypopnea requiring 4% desaturations.  The channels recorded and monitored were frontal, central and occipital EEG, electrooculogram (EOG), submentalis EMG (chin), nasal and oral airflow, thoracic and abdominal wall motion, anterior tibialis EMG, snore microphone, electrocardiogram, and pulse oximetry. Continuous positive airway pressure (CPAP) was initiated when the patient met split night criteria and was titrated according to treat sleep-disordered breathing.  MEDICATIONS Medications self-administered by patient taken the night of the study : none reported  RESPIRATORY PARAMETERS Diagnostic  Total AHI (/hr): 95.8 RDI (/hr): 98.0 OA Index (/hr): 61.4 CA Index (/hr): 9.6 REM AHI (/hr): N/A NREM AHI (/hr): 95.8 Supine AHI (/hr): 95.8 Non-supine AHI (/hr): N/A Min O2 Sat (%): 71.0 Mean O2 (%): 90.4 Time below 88% (min): 34.9   Titration  Optimal Pressure (cm): 12 AHI at Optimal Pressure (/hr): 1.7 Min O2 at Optimal Pressure (%): 92.0 Supine % at Optimal (%): 100 Sleep % at Optimal (%): 96   SLEEP ARCHITECTURE The recording time for the entire night was 366.2 minutes.  During a baseline period of 186.1 minutes, the patient slept for 106.5 minutes in REM and nonREM, yielding a sleep efficiency of 57.2%%. Sleep onset after lights out was 44.4 minutes with a REM latency of N/A minutes. The patient spent 35.2%% of the night in stage N1 sleep, 64.8%% in  stage N2 sleep, 0.0%% in stage N3 and 0.0%% in REM.  During the titration period of 177.0 minutes, the patient slept for 168.8 minutes in REM and nonREM, yielding a sleep efficiency of 95.4%%. Sleep onset after CPAP initiation was 0.7 minutes with a REM latency of 44.0 minutes. The patient spent 12.7%% of the night in stage N1 sleep, 80.7%% in stage N2 sleep, 0.0%% in stage N3 and 6.5%% in REM.  CARDIAC DATA The 2 lead EKG demonstrated sinus rhythm. The mean heart rate was 100.0 beats per minute. Other EKG findings include: None.  LEG MOVEMENT DATA The total Periodic Limb Movements of Sleep (PLMS) were 0. The PLMS index was 0.0 .  IMPRESSIONS - Severe obstructive sleep apnea occurred during the diagnostic portion of the study (AHI = 95.8/hour). An optimal PAP pressure was selected for this patient ( 12 cm of water) - No significant central sleep apnea occurred during the diagnostic portion of the study (CAI = 9.6/hour). - Severe oxygen desaturation was noted during the diagnostic portion of the study (Min O2 = 71.0%). - The patient snored with loud snoring volume during the diagnostic portion of the study. - No cardiac abnormalities were noted during this study. - Clinically significant periodic limb movements did not occur during sleep.  DIAGNOSIS - Obstructive Sleep Apnea (327.23 [G47.33 ICD-10])  RECOMMENDATIONS - Trial of CPAP therapy on 12 cm H2O with a Medium size Resmed Full Face Mask AirFit F20 mask and heated humidification. - Be careful with alcohol, sedatives and other CNS depressants that may worsen sleep apnea and disrupt normal sleep architecture. - Sleep hygiene should be reviewed to assess factors that may improve sleep quality. - Weight management  and regular exercise should be initiated or continued.  [Electronically signed] 11/19/2017 01:55 PM  Baird Lyons MD, [place], American Board of Sleep Medicine   NPI: [id number]                          [place], [place] [facility]  ELECTRONICALLY SIGNED ON:  11/19/2017, 1:54 PM [place] PH: [phone]   FX: [phone] [place]

## 2017-11-19 NOTE — Progress Notes (Signed)
  [place] OFFICE PROGRESS NOTE   Diagnosis: Colon cancer  INTERVAL HISTORY:   Ms. Killingsworth returns as scheduled.  She completed cycle 2 FOLFOX 11/05/2017.  She denies nausea/vomiting.  No mouth sores.  No diarrhea.  She notes that palms are "darker".  No redness or pain on the palms or soles.  She experienced cold sensitivity mainly in the fingertips and toes.  She intermittently notes blood with nose blowing.  No other bleeding.  She felt jittery for a few days after the last treatment.  No fever or chills.  Objective:  Vital signs in last 24 hours:  Blood pressure (!) 165/80, pulse 74, temperature 98.4 F (36.9 C), temperature source Oral, resp. rate 20, height '5\' 3"'$  (1.6 m), weight 219 lb 1.6 oz (99.4 kg), last menstrual period 07/10/2001, SpO2 97 %.    HEENT: No thrush or ulcers. Resp: Lungs clear bilaterally. Cardio: Regular rate and rhythm. GI: Abdomen soft and nontender.  No hepatomegaly. Vascular: No leg edema.  Skin: Palms with hyperpigmentation.  No erythema or skin breakdown. Port-A-Cath without erythema.   Lab Results:  Lab Results  Component Value Date   WBC 4.2 11/05/2017   HGB 11.6 (L) 09/25/2017   HCT 34.4 (L) 11/05/2017   MCV 90.1 11/05/2017   PLT 242 11/05/2017   NEUTROABS 2.2 11/05/2017    Imaging:  No results found.  Medications: I have reviewed the patient's current medications.  Assessment/Plan: 1. 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 MSI-stable ? 3/14 lymph nodes positive for metastatic carcinoma, lymphovascular invasion present ? Cycle 1 FOLFOX 10/22/2017 ? CTs chest, abdomen, and pelvis 10/28/2017- negative for metastatic disease ? Cycle 2 FOLFOX 11/05/2017  2.Asthma  3.Port-A-Cath placement 10/17/2017  4.   Neutropenia secondary to chemotherapy    Disposition: Ms. Maners appears stable.  She has completed 2 cycles of FOLFOX.  She is neutropenic on labs  today.  We will hold today's treatment and reschedule for 1 week.  The plan will be for her to receive Neulasta on the day of pump discontinuation with cycle 3.  Neutropenic precautions were reviewed.  She understands to contact the office with fever, chills, or other signs of infection.  We will see her in follow-up prior to proceeding with treatment next week.    Ned Card ANP/GNP-BC   11/19/2017  11:44 AM

## 2017-11-19 NOTE — Telephone Encounter (Signed)
Unable to schedule los due to capped day -logged and will give patient a call when appts are scheduled.

## 2017-11-20 ENCOUNTER — Other Ambulatory Visit: Payer: Self-pay | Admitting: Emergency Medicine

## 2017-11-20 ENCOUNTER — Telehealth: Payer: Self-pay | Admitting: *Deleted

## 2017-11-20 ENCOUNTER — Telehealth: Payer: Self-pay | Admitting: Emergency Medicine

## 2017-11-20 DIAGNOSIS — G4730 Sleep apnea, unspecified: Secondary | ICD-10-CM

## 2017-11-20 MED ORDER — POTASSIUM CHLORIDE CRYS ER 20 MEQ PO TBCR: 20.0000 meq | EXTENDED_RELEASE_TABLET | Freq: Every day | ORAL | 0 refills | Status: DC

## 2017-11-20 NOTE — Telephone Encounter (Signed)
-----   Message from Irwin, MD sent at 11/20/2017 12:39 PM EDT ----- These results may have been discussed with her already.   If not please let her know the following results of this sleep study:   - Severe obstructive sleep apnea occurred during the diagnostic  portion of the study - No significant central sleep apnea occurred during the  diagnostic portion of the study - Severe oxygen desaturation was noted during the diagnostic  portion of the study down to 71.0% - The patient snored with loud snoring volume during the  diagnostic portion of the study. - No cardiac abnormalities were noted during this study. - Clinically significant periodic limb movements did not occur  during sleep.  DIAGNOSIS:  Obstructive Sleep Apnea   RECOMMENDATIONS per sleep medicine: - Trial of CPAP therapy on 12 cm H2O with a Medium size Resmed  Full Face Mask AirFit F20 mask and heated humidification. - Be careful with alcohol, sedatives and other depressants  that may worsen sleep apnea and disrupt normal sleep  architecture. - Sleep hygiene should be reviewed to assess factors that may  improve sleep quality. - Weight management and regular exercise should be initiated or  continued.

## 2017-11-20 NOTE — Telephone Encounter (Addendum)
Pt verbalized understanding of this note.   ----- Message from Owens Shark, NP sent at 11/20/2017  8:36 AM EDT ----- Please let her know potassium is low. Begin Kdur 20 meq daily.

## 2017-11-20 NOTE — Telephone Encounter (Signed)
Referred to Dr. Annamaria Boots for him to manage CPAP.

## 2017-11-26 ENCOUNTER — Ambulatory Visit: Payer: BC Managed Care – PPO | Admitting: Nurse Practitioner

## 2017-11-26 ENCOUNTER — Other Ambulatory Visit: Payer: Self-pay | Admitting: Nurse Practitioner

## 2017-11-27 ENCOUNTER — Inpatient Hospital Stay: Payer: BC Managed Care – PPO

## 2017-11-27 ENCOUNTER — Inpatient Hospital Stay (HOSPITAL_BASED_OUTPATIENT_CLINIC_OR_DEPARTMENT_OTHER): Payer: BC Managed Care – PPO | Admitting: Oncology

## 2017-11-27 ENCOUNTER — Telehealth: Payer: Self-pay | Admitting: Oncology

## 2017-11-27 VITALS — BP 145/75 | HR 77 | Temp 97.6°F | Resp 17 | Ht 63.0 in | Wt 219.0 lb

## 2017-11-27 DIAGNOSIS — D701 Agranulocytosis secondary to cancer chemotherapy: Secondary | ICD-10-CM | POA: Diagnosis not present

## 2017-11-27 DIAGNOSIS — Z5111 Encounter for antineoplastic chemotherapy: Secondary | ICD-10-CM | POA: Diagnosis not present

## 2017-11-27 DIAGNOSIS — C180 Malignant neoplasm of cecum: Secondary | ICD-10-CM

## 2017-11-27 DIAGNOSIS — J45909 Unspecified asthma, uncomplicated: Secondary | ICD-10-CM | POA: Diagnosis not present

## 2017-11-27 DIAGNOSIS — C182 Malignant neoplasm of ascending colon: Secondary | ICD-10-CM

## 2017-11-27 DIAGNOSIS — Z95828 Presence of other vascular implants and grafts: Secondary | ICD-10-CM

## 2017-11-27 LAB — CBC WITH DIFFERENTIAL (CANCER CENTER ONLY)
Basophils Absolute: 0 10*3/uL (ref 0.0–0.1)
Basophils Relative: 1 %
EOS PCT: 1 %
Eosinophils Absolute: 0 10*3/uL (ref 0.0–0.5)
HCT: 36.1 % (ref 34.8–46.6)
Hemoglobin: 11.9 g/dL (ref 11.6–15.9)
LYMPHS ABS: 2 10*3/uL (ref 0.9–3.3)
Lymphocytes Relative: 32 %
MCH: 30.4 pg (ref 25.1–34.0)
MCHC: 33.1 g/dL (ref 31.5–36.0)
MCV: 91.9 fL (ref 79.5–101.0)
MONOS PCT: 12 %
Monocytes Absolute: 0.7 10*3/uL (ref 0.1–0.9)
Neutro Abs: 3.4 10*3/uL (ref 1.5–6.5)
Neutrophils Relative %: 54 %
PLATELETS: 309 10*3/uL (ref 145–400)
RBC: 3.93 MIL/uL (ref 3.70–5.45)
RDW: 16.3 % — ABNORMAL HIGH (ref 11.2–14.5)
WBC Count: 6.2 10*3/uL (ref 3.9–10.3)

## 2017-11-27 MED ORDER — FLUOROURACIL CHEMO INJECTION 5 GM/100ML
2375.0000 mg/m2 | INTRAVENOUS | Status: DC
  Administered 2017-11-27: 5000 mg via INTRAVENOUS
  Filled 2017-11-27: qty 100

## 2017-11-27 MED ORDER — PALONOSETRON HCL INJECTION 0.25 MG/5ML
0.2500 mg | Freq: Once | INTRAVENOUS | Status: AC
  Administered 2017-11-27: 0.25 mg via INTRAVENOUS

## 2017-11-27 MED ORDER — DEXAMETHASONE SODIUM PHOSPHATE 10 MG/ML IJ SOLN
INTRAMUSCULAR | Status: AC
  Filled 2017-11-27: qty 1

## 2017-11-27 MED ORDER — FLUOROURACIL CHEMO INJECTION 2.5 GM/50ML
400.0000 mg/m2 | Freq: Once | INTRAVENOUS | Status: AC
Start: 2017-11-27 — End: 2017-11-27
  Administered 2017-11-27: 850 mg via INTRAVENOUS
  Filled 2017-11-27: qty 17

## 2017-11-27 MED ORDER — OXALIPLATIN CHEMO INJECTION 100 MG/20ML
85.0000 mg/m2 | Freq: Once | INTRAVENOUS | Status: AC
  Administered 2017-11-27: 180 mg via INTRAVENOUS
  Filled 2017-11-27: qty 36

## 2017-11-27 MED ORDER — SODIUM CHLORIDE 0.9% FLUSH
10.0000 mL | Freq: Once | INTRAVENOUS | Status: AC
  Administered 2017-11-27: 10 mL
  Filled 2017-11-27: qty 10

## 2017-11-27 MED ORDER — LEUCOVORIN CALCIUM INJECTION 350 MG
400.0000 mg/m2 | Freq: Once | INTRAMUSCULAR | Status: AC
  Administered 2017-11-27: 844 mg via INTRAVENOUS
  Filled 2017-11-27: qty 42.2

## 2017-11-27 MED ORDER — DEXAMETHASONE SODIUM PHOSPHATE 10 MG/ML IJ SOLN
10.0000 mg | Freq: Once | INTRAMUSCULAR | Status: AC
  Administered 2017-11-27: 10 mg via INTRAVENOUS

## 2017-11-27 MED ORDER — PALONOSETRON HCL INJECTION 0.25 MG/5ML
INTRAVENOUS | Status: AC
  Filled 2017-11-27: qty 5

## 2017-11-27 MED ORDER — DEXTROSE 5 % IV SOLN
Freq: Once | INTRAVENOUS | Status: AC
  Administered 2017-11-27: 12:00:00 via INTRAVENOUS

## 2017-11-27 NOTE — Telephone Encounter (Signed)
Unable to schedule appt per 3/21 los due to capped day - patient is aware and will receive a call when scheduled. appt logged.

## 2017-11-27 NOTE — Progress Notes (Signed)
Met with patient in lobby today to assess for navigation needs and to offer encouragement. Patient shared that she is hoping to be able to drive herself to her chemo appointments but will wait to see how she tolerates the next few treatments before deciding. No navigation needs voiced.

## 2017-11-27 NOTE — Patient Instructions (Addendum)
[facility] Discharge Instructions for Patients Receiving Chemotherapy  Today you received the following chemotherapy agents Oxaliplatin, Leucovorin, and Adrucil   To help prevent nausea and vomiting after your treatment, we encourage you to take your nausea medication (Compazine) as directed.    If you develop nausea and vomiting that is not controlled by your nausea medication, call the clinic.   BELOW ARE SYMPTOMS THAT SHOULD BE REPORTED IMMEDIATELY:  *FEVER GREATER THAN 100.5 F  *CHILLS WITH OR WITHOUT FEVER  NAUSEA AND VOMITING THAT IS NOT CONTROLLED WITH YOUR NAUSEA MEDICATION  *UNUSUAL SHORTNESS OF BREATH  *UNUSUAL BRUISING OR BLEEDING  TENDERNESS IN MOUTH AND THROAT WITH OR WITHOUT PRESENCE OF ULCERS  *URINARY PROBLEMS  *BOWEL PROBLEMS  UNUSUAL RASH Items with * indicate a potential emergency and should be followed up as soon as possible.  Feel free to call the clinic should you have any questions or concerns. The clinic phone number is [phone].  Please show the [place] at check-in to the Emergency Department and triage nurse.

## 2017-11-27 NOTE — Progress Notes (Signed)
  [place] OFFICE PROGRESS NOTE   Diagnosis: Colon cancer  INTERVAL HISTORY:   Sharon Allison returns as scheduled.  Cycle 3 FOLFOX was held last week secondary to neutropenia.  She reports prolonged cold sensitivity following cycle 2 chemotherapy.  No neuropathy symptoms today.  No hand/foot pain or diarrhea.  She has noted hyperpigmentation of the hands.  Objective:  Vital signs in last 24 hours:  Blood pressure (!) 145/75, pulse 77, temperature 97.6 F (36.4 C), temperature source Oral, resp. rate 17, height '5\' 3"'$  (1.6 m), weight 219 lb (99.3 kg), last menstrual period 07/10/2001, SpO2 100 %.    HEENT: No thrush or ulcers Resp: Lungs clear bilaterally Cardio: Regular rate and rhythm GI: No hepatomegaly, nontender Vascular: No leg edema  Skin: Dryness of the palms and soles with skin thickening.  Mild hyperpigmentation of the hands.  No erythema or skin breakdown.  Portacath/PICC-without erythema  Lab Results:  Lab Results  Component Value Date   WBC 6.2 11/27/2017   HGB 11.6 (L) 09/25/2017   HCT 36.1 11/27/2017   MCV 91.9 11/27/2017   PLT 309 11/27/2017   NEUTROABS 3.4 11/27/2017    CMP     Component Value Date/Time   NA 139 11/19/2017 1046   NA 138 04/04/2017 1009   K 3.2 (L) 11/19/2017 1046   CL 106 11/19/2017 1046   CO2 25 11/19/2017 1046   GLUCOSE 120 11/19/2017 1046   BUN 12 11/19/2017 1046   BUN 13 04/04/2017 1009   CREATININE 0.73 11/19/2017 1046   CREATININE 0.62 03/29/2016 1602   CALCIUM 10.5 (H) 11/19/2017 1046   PROT 6.4 11/19/2017 1046   PROT 6.7 04/04/2017 1009   ALBUMIN 3.4 (L) 11/19/2017 1046   ALBUMIN 4.3 04/04/2017 1009   AST 23 11/19/2017 1046   ALT 26 11/19/2017 1046   ALKPHOS 107 11/19/2017 1046   BILITOT 0.4 11/19/2017 1046   GFRNONAA >60 11/19/2017 1046   GFRAA >60 11/19/2017 1046    Lab Results  Component Value Date   CEA1 <1.00 10/21/2017    Medications: I have reviewed the patient's current  medications.   Assessment/Plan: 1. 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 MSI-stable ? 3/14 lymph nodes positive for metastatic carcinoma, lymphovascular invasion present ? Cycle 1 FOLFOX 10/22/2017 ? CTs chest, abdomen, and pelvis 10/28/2017- negative for metastatic disease ? Cycle 2 FOLFOX 11/05/2017 ? Cycle 3 FOLFOX 11/27/2017  2.Asthma  3.Port-A-Cath placement 10/17/2017  4.   Neutropenia secondary to chemotherapy-resolved   Disposition: Sharon Allison has completed 2 cycles of FOLFOX.  She has tolerated the chemotherapy well, though she had prolonged cold sensitivity following cycle 2.  She had neutropenia following cycle 2.  The neutrophil count has recovered.  The plan is to proceed with cycle 3 FOLFOX today.  Her insurance company declined coverage of G-CSF.  She will contact us for a fever or symptoms of an infection.  She will return for an office visit and chemotherapy in 2 weeks.  We will resubmit a request for G-CSF to the insurance company if she is neutropenic following this cycle. 25 minutes were spent with the patient today.  The majority of the time was used for counseling and coordination of care.   Betsy Coder, MD  11/27/2017  11:02 AM

## 2017-11-27 NOTE — Patient Instructions (Signed)
Implanted Port Home Guide An implanted port is a type of central line that is placed under the skin. Central lines are used to provide IV access when treatment or nutrition needs to be given through a person's veins. Implanted ports are used for long-term IV access. An implanted port may be placed because:  You need IV medicine that would be irritating to the small veins in your hands or arms.  You need long-term IV medicines, such as antibiotics.  You need IV nutrition for a long period.  You need frequent blood draws for lab tests.  You need dialysis.  Implanted ports are usually placed in the chest area, but they can also be placed in the upper arm, the abdomen, or the leg. An implanted port has two main parts:  Reservoir. The reservoir is round and will appear as a small, raised area under your skin. The reservoir is the part where a needle is inserted to give medicines or draw blood.  Catheter. The catheter is a thin, flexible tube that extends from the reservoir. The catheter is placed into a large vein. Medicine that is inserted into the reservoir goes into the catheter and then into the vein.  How will I care for my incision site? Do not get the incision site wet. Bathe or shower as directed by your health care provider. How is my port accessed? Special steps must be taken to access the port:  Before the port is accessed, a numbing cream can be placed on the skin. This helps numb the skin over the port site.  Your health care provider uses a sterile technique to access the port. ? Your health care provider must put on a mask and sterile gloves. ? The skin over your port is cleaned carefully with an antiseptic and allowed to dry. ? The port is gently pinched between sterile gloves, and a needle is inserted into the port.  Only "non-coring" port needles should be used to access the port. Once the port is accessed, a blood return should be checked. This helps ensure that the port  is in the vein and is not clogged.  If your port needs to remain accessed for a constant infusion, a clear (transparent) bandage will be placed over the needle site. The bandage and needle will need to be changed every week, or as directed by your health care provider.  Keep the bandage covering the needle clean and dry. Do not get it wet. Follow your health care provider's instructions on how to take a shower or bath while the port is accessed.  If your port does not need to stay accessed, no bandage is needed over the port.  What is flushing? Flushing helps keep the port from getting clogged. Follow your health care provider's instructions on how and when to flush the port. Ports are usually flushed with saline solution or a medicine called heparin. The need for flushing will depend on how the port is used.  If the port is used for intermittent medicines or blood draws, the port will need to be flushed: ? After medicines have been given. ? After blood has been drawn. ? As part of routine maintenance.  If a constant infusion is running, the port may not need to be flushed.  How long will my port stay implanted? The port can stay in for as long as your health care provider thinks it is needed. When it is time for the port to come out, surgery will be   done to remove it. The procedure is similar to the one performed when the port was put in. When should I seek immediate medical care? When you have an implanted port, you should seek immediate medical care if:  You notice a bad smell coming from the incision site.  You have swelling, redness, or drainage at the incision site.  You have more swelling or pain at the port site or the surrounding area.  You have a fever that is not controlled with medicine.  This information is not intended to replace advice given to you by your health care provider. Make sure you discuss any questions you have with your health care provider. Document  Released: 08/26/2005 Document Revised: 02/01/2016 Document Reviewed: 05/03/2013 Elsevier Interactive Patient Education  2017 [employer]  

## 2017-11-27 NOTE — Progress Notes (Signed)
OK to treat with 11/19/17 CMET, per Dr. Benay Spice. Treatment was held last week due to neutropenia. Cloyde Reams, Infusion RN made aware.

## 2017-11-29 ENCOUNTER — Inpatient Hospital Stay: Payer: BC Managed Care – PPO

## 2017-11-29 VITALS — BP 150/79 | HR 78 | Temp 98.9°F | Resp 20

## 2017-11-29 DIAGNOSIS — Z5111 Encounter for antineoplastic chemotherapy: Secondary | ICD-10-CM | POA: Diagnosis not present

## 2017-11-29 DIAGNOSIS — C182 Malignant neoplasm of ascending colon: Secondary | ICD-10-CM

## 2017-11-29 MED ORDER — HEPARIN SOD (PORK) LOCK FLUSH 100 UNIT/ML IV SOLN
500.0000 [IU] | Freq: Once | INTRAVENOUS | Status: AC | PRN
  Administered 2017-11-29: 500 [IU]
  Filled 2017-11-29: qty 5

## 2017-11-29 MED ORDER — SODIUM CHLORIDE 0.9% FLUSH
10.0000 mL | INTRAVENOUS | Status: DC | PRN
Start: 2017-11-29 — End: 2017-11-29
  Administered 2017-11-29: 10 mL
  Filled 2017-11-29: qty 10

## 2017-11-29 NOTE — Patient Instructions (Signed)
Implanted Port Home Guide An implanted port is a type of central line that is placed under the skin. Central lines are used to provide IV access when treatment or nutrition needs to be given through a person's veins. Implanted ports are used for long-term IV access. An implanted port may be placed because:  You need IV medicine that would be irritating to the small veins in your hands or arms.  You need long-term IV medicines, such as antibiotics.  You need IV nutrition for a long period.  You need frequent blood draws for lab tests.  You need dialysis.  Implanted ports are usually placed in the chest area, but they can also be placed in the upper arm, the abdomen, or the leg. An implanted port has two main parts:  Reservoir. The reservoir is round and will appear as a small, raised area under your skin. The reservoir is the part where a needle is inserted to give medicines or draw blood.  Catheter. The catheter is a thin, flexible tube that extends from the reservoir. The catheter is placed into a large vein. Medicine that is inserted into the reservoir goes into the catheter and then into the vein.  How will I care for my incision site? Do not get the incision site wet. Bathe or shower as directed by your health care provider. How is my port accessed? Special steps must be taken to access the port:  Before the port is accessed, a numbing cream can be placed on the skin. This helps numb the skin over the port site.  Your health care provider uses a sterile technique to access the port. ? Your health care provider must put on a mask and sterile gloves. ? The skin over your port is cleaned carefully with an antiseptic and allowed to dry. ? The port is gently pinched between sterile gloves, and a needle is inserted into the port.  Only "non-coring" port needles should be used to access the port. Once the port is accessed, a blood return should be checked. This helps ensure that the port  is in the vein and is not clogged.  If your port needs to remain accessed for a constant infusion, a clear (transparent) bandage will be placed over the needle site. The bandage and needle will need to be changed every week, or as directed by your health care provider.  Keep the bandage covering the needle clean and dry. Do not get it wet. Follow your health care provider's instructions on how to take a shower or bath while the port is accessed.  If your port does not need to stay accessed, no bandage is needed over the port.  What is flushing? Flushing helps keep the port from getting clogged. Follow your health care provider's instructions on how and when to flush the port. Ports are usually flushed with saline solution or a medicine called heparin. The need for flushing will depend on how the port is used.  If the port is used for intermittent medicines or blood draws, the port will need to be flushed: ? After medicines have been given. ? After blood has been drawn. ? As part of routine maintenance.  If a constant infusion is running, the port may not need to be flushed.  How long will my port stay implanted? The port can stay in for as long as your health care provider thinks it is needed. When it is time for the port to come out, surgery will be   done to remove it. The procedure is similar to the one performed when the port was put in. When should I seek immediate medical care? When you have an implanted port, you should seek immediate medical care if:  You notice a bad smell coming from the incision site.  You have swelling, redness, or drainage at the incision site.  You have more swelling or pain at the port site or the surrounding area.  You have a fever that is not controlled with medicine.  This information is not intended to replace advice given to you by your health care provider. Make sure you discuss any questions you have with your health care provider. Document  Released: 08/26/2005 Document Revised: 02/01/2016 Document Reviewed: 05/03/2013 Elsevier Interactive Patient Education  2017 [employer]  

## 2017-12-01 ENCOUNTER — Telehealth: Payer: Self-pay | Admitting: Oncology

## 2017-12-01 NOTE — Telephone Encounter (Signed)
Scheduled appt per 3/21 los - Patient is aware of appt date and time.

## 2017-12-03 ENCOUNTER — Other Ambulatory Visit: Payer: BC Managed Care – PPO

## 2017-12-03 ENCOUNTER — Telehealth: Payer: Self-pay

## 2017-12-03 ENCOUNTER — Ambulatory Visit: Payer: BC Managed Care – PPO | Admitting: Nurse Practitioner

## 2017-12-03 ENCOUNTER — Ambulatory Visit: Payer: BC Managed Care – PPO

## 2017-12-03 NOTE — Telephone Encounter (Signed)
Pt called, was wondering about her C-PAP machine. Gave her the number to Dr. Janee Morn office ([facility]) because she had already been referred there by Dr. Nelva Bush.  If she calls back and has issues with the referral, give it to [facility] and let her look into it (per Dr. Nelva Bush).Thanks

## 2017-12-04 ENCOUNTER — Ambulatory Visit (INDEPENDENT_AMBULATORY_CARE_PROVIDER_SITE_OTHER): Payer: BC Managed Care – PPO | Admitting: Pulmonary Disease

## 2017-12-04 ENCOUNTER — Encounter: Payer: Self-pay | Admitting: Pulmonary Disease

## 2017-12-04 VITALS — BP 138/85 | HR 82 | Ht 63.0 in | Wt 215.0 lb

## 2017-12-04 DIAGNOSIS — R530 Neoplastic (malignant) related fatigue: Secondary | ICD-10-CM

## 2017-12-04 DIAGNOSIS — G4733 Obstructive sleep apnea (adult) (pediatric): Secondary | ICD-10-CM | POA: Diagnosis not present

## 2017-12-04 NOTE — Progress Notes (Signed)
[facility]  Chief Complaint  Patient presents with  . Sleep Consult    Referred by Dr. Nelva Bush for OSA. Was diagnosed with OSA at least 10 years. Used a CPAP for years and then stopped. Had two sleep studies within the past 6 months.     Vital signs: BP 138/85 (BP Location: Right Arm, Patient Position: Sitting, Cuff Size: Large)   Pulse 82   Ht 5\' 3"  (1.6 m)   Wt 215 lb (97.5 kg)   LMP 07/10/2001 (LMP Unknown)   SpO2 98%   BMI 38.09 kg/m   History of Present Illness: Sharon Allison is a 62 y.o. female for evaluation of sleep problems.  She has a sleep study years ago and dx with sleep apnea.   She was on cpap.  She lost weight and sleeping improved.  Stopped CPAP.  She moved to [place] area, and after moving here started gaining weight.  With increase in weight her sleep got worse.  Started snoring more and getting more sleepy during the day.  Had sleep study in February that showed severe OSA.  She goes to sleep at 12 am.  She falls asleep in 30 minutes.  She wakes up one time to use the bathroom.  She gets out of bed at 10 am.  She feels tired in the morning.  She denies morning headache.  She does not use anything to help her fall sleep or stay awake.  She denies sleep walking, sleep talking, bruxism, or nightmares.  There is no history of restless legs.  She denies sleep hallucinations, sleep paralysis, or cataplexy.  The Epworth score is 18 out of 24.  She was recently diagnosed with colon cancer.  Had partial colectomy and then started on chemotherapy.  She has noticed fatigue from chemo, but these symptoms were present prior to this.   Physical Exam:  General - pleasant Eyes - pupils reactive ENT - no sinus tenderness, no oral exudate, no LAN, MP 2, low laying soft palate Cardiac - regular, no murmur Chest - no wheeze, rales Abd - soft, non tender Ext - no edema Skin - no rashes Neuro - normal strength Psych - normal  mood  Discussion: She has snoring ,sleep disruption, apnea, and daytime sleepiness.  She has history of hypertension and obstructive sleep apnea.  Recent sleep study showed severe obstructive sleep apnea.  We discussed how sleep apnea can affect various health problems, including risks for hypertension, cardiovascular disease, and diabetes.  We also discussed how sleep disruption can increase risks for accidents, such as while driving.  Weight loss as a means of improving sleep apnea was also reviewed.  Additional treatment options discussed were CPAP therapy, oral appliance, and surgical intervention.  Assessment/Plan:  Obstructive sleep apnea. - will arrange for auto CPAP set up  Cancer related fatigue. - reassess after she is started on CPAP therapy   Patient Instructions  Will arrange for CPAP set up  Follow up in 2 months after CPAP set up     Chesley Mires, MD [place] 12/04/2017, 11:33 AM Pager:  [phone]  Flow Sheet  Pulmonary tests:  Sleep tests: PSG 11/06/17 >> AHI 95.8, SpO2 low 71%  Cardiac tests:  Review of Systems:  Constitutional: Negative for fever and unexpected weight change.  HENT: Positive for postnasal drip and sore throat. Negative for congestion, dental problem, ear pain, nosebleeds, rhinorrhea, sinus pressure, sneezing and trouble swallowing.   Eyes: Negative for redness  and itching.  Respiratory: Negative for cough, chest tightness, shortness of breath and wheezing.   Cardiovascular: Negative for palpitations and leg swelling.  Gastrointestinal: Negative for nausea and vomiting.  Genitourinary: Negative for dysuria.  Musculoskeletal: Negative for joint swelling.  Skin: Negative for rash.  Allergic/Immunologic: Negative.  Negative for environmental allergies, food allergies and immunocompromised state.  Neurological: Negative for headaches.  Hematological: Does not bruise/bleed easily.  Psychiatric/Behavioral: Negative  for dysphoric mood. The patient is not nervous/anxious.    Past Medical History: She  has a past medical history of Anemia, Asthma, Colon polyp, Fibroid, GERD (gastroesophageal reflux disease), Heart murmur, Hypertension, and Sleep apnea.  Past Surgical History: She  has a past surgical history that includes Myomectomy (DONE 5 YEARS PRIOR TO HYSTERECTOMY); Abdominal hysterectomy; Laparoscopic right hemi colectomy (Right, 09/18/2017); Colonoscopy (N/A, 09/18/2017); and Portacath placement (N/A, 10/17/2017).  Family History: Her family history includes Cancer in her maternal grandfather; Hodgkin's lymphoma in her maternal grandmother; Hypertension in her mother; Kidney disease in her brother; Leukemia in her brother, mother, and sister; Lupus in her father.  Social History: She  reports that she has never smoked. She has never used smokeless tobacco. She reports that she drinks alcohol. She reports that she does not use drugs.  Medications: Allergies as of 12/04/2017      Reactions   Iohexol Swelling   Patient had swelling and itching to face in 1980's. Had 13 hour pre meds before scan 10/27/17   Iodine Swelling   Sulfa Antibiotics Itching      Medication List        Accurate as of 12/04/17 11:33 AM. Always use your most recent med list.          albuterol 108 (90 Base) MCG/ACT inhaler Commonly known as:  PROVENTIL HFA;VENTOLIN HFA Inhale 2 puffs into the lungs every 4 (four) hours as needed for wheezing or shortness of breath.   budesonide-formoterol 160-4.5 MCG/ACT inhaler Commonly known as:  SYMBICORT Inhale 2 puffs into the lungs 2 (two) times daily.   CENTRUM SILVER PO Take 1 tablet by mouth daily.   DYMISTA 137-50 MCG/ACT Susp Generic drug:  Azelastine-Fluticasone Place 1 spray into the nose daily as needed (congestion).   ketotifen 0.025 % ophthalmic solution Commonly known as:  ZADITOR Place 1 drop into both eyes daily as needed (allergies).   lansoprazole 15 MG  capsule Commonly known as:  PREVACID Take 15 mg by mouth daily as needed (acid reflux).   lidocaine-prilocaine cream Commonly known as:  EMLA Apply to portacath 1-2 hours prior to use   montelukast 10 MG tablet Commonly known as:  SINGULAIR Take 1 tablet (10 mg total) by mouth at bedtime.   potassium chloride SA 20 MEQ tablet Commonly known as:  K-DUR,KLOR-CON Take 1 tablet (20 mEq total) by mouth daily.   SYSTANE OP Place 1 drop into both eyes daily as needed (dry eyes).   vitamin B-12 250 MCG tablet Commonly known as:  CYANOCOBALAMIN Take 250 mcg by mouth daily.   vitamin C 1000 MG tablet Take 1,000 mg by mouth daily.   Vitamin D3 1000 units Caps Take 2,000 Units by mouth daily.

## 2017-12-04 NOTE — Patient Instructions (Signed)
Will arrange for CPAP set up  Follow up in 2 months after CPAP set up 

## 2017-12-04 NOTE — Progress Notes (Signed)
   Subjective:    Patient ID: Sharon Allison, female    DOB: [birth year], 62 y.o.   MRN: [id number]  HPI    Review of Systems  Constitutional: Negative for fever and unexpected weight change.  HENT: Positive for postnasal drip and sore throat. Negative for congestion, dental problem, ear pain, nosebleeds, rhinorrhea, sinus pressure, sneezing and trouble swallowing.   Eyes: Negative for redness and itching.  Respiratory: Negative for cough, chest tightness, shortness of breath and wheezing.   Cardiovascular: Negative for palpitations and leg swelling.  Gastrointestinal: Negative for nausea and vomiting.  Genitourinary: Negative for dysuria.  Musculoskeletal: Negative for joint swelling.  Skin: Negative for rash.  Allergic/Immunologic: Negative.  Negative for environmental allergies, food allergies and immunocompromised state.  Neurological: Negative for headaches.  Hematological: Does not bruise/bleed easily.  Psychiatric/Behavioral: Negative for dysphoric mood. The patient is not nervous/anxious.        Objective:   Physical Exam        Assessment & Plan:

## 2017-12-05 ENCOUNTER — Other Ambulatory Visit: Payer: Self-pay

## 2017-12-05 DIAGNOSIS — G4733 Obstructive sleep apnea (adult) (pediatric): Secondary | ICD-10-CM

## 2017-12-07 ENCOUNTER — Other Ambulatory Visit: Payer: Self-pay | Admitting: Oncology

## 2017-12-10 ENCOUNTER — Inpatient Hospital Stay: Payer: BC Managed Care – PPO

## 2017-12-10 ENCOUNTER — Inpatient Hospital Stay (HOSPITAL_BASED_OUTPATIENT_CLINIC_OR_DEPARTMENT_OTHER): Payer: BC Managed Care – PPO | Admitting: Oncology

## 2017-12-10 ENCOUNTER — Inpatient Hospital Stay: Payer: BC Managed Care – PPO | Attending: Oncology

## 2017-12-10 ENCOUNTER — Telehealth: Payer: Self-pay | Admitting: Oncology

## 2017-12-10 VITALS — BP 140/83 | HR 75 | Temp 97.7°F | Resp 18 | Ht 63.0 in | Wt 217.7 lb

## 2017-12-10 DIAGNOSIS — Z5111 Encounter for antineoplastic chemotherapy: Secondary | ICD-10-CM | POA: Insufficient documentation

## 2017-12-10 DIAGNOSIS — C180 Malignant neoplasm of cecum: Secondary | ICD-10-CM | POA: Insufficient documentation

## 2017-12-10 DIAGNOSIS — Z95828 Presence of other vascular implants and grafts: Secondary | ICD-10-CM

## 2017-12-10 DIAGNOSIS — Z5189 Encounter for other specified aftercare: Secondary | ICD-10-CM | POA: Insufficient documentation

## 2017-12-10 DIAGNOSIS — C182 Malignant neoplasm of ascending colon: Secondary | ICD-10-CM

## 2017-12-10 DIAGNOSIS — Z452 Encounter for adjustment and management of vascular access device: Secondary | ICD-10-CM | POA: Diagnosis present

## 2017-12-10 LAB — CMP (CANCER CENTER ONLY)
ALBUMIN: 3.4 g/dL — AB (ref 3.5–5.0)
ALK PHOS: 112 U/L (ref 40–150)
ALT: 18 U/L (ref 0–55)
AST: 17 U/L (ref 5–34)
Anion gap: 9 (ref 3–11)
BILIRUBIN TOTAL: 0.4 mg/dL (ref 0.2–1.2)
BUN: 9 mg/dL (ref 7–26)
CALCIUM: 11 mg/dL — AB (ref 8.4–10.4)
CO2: 25 mmol/L (ref 22–29)
CREATININE: 0.74 mg/dL (ref 0.60–1.10)
Chloride: 108 mmol/L (ref 98–109)
GFR, Est AFR Am: >60 mL/min (ref >60)
Glucose, Bld: 114 mg/dL (ref 70–140)
Potassium: 3.3 mmol/L — ABNORMAL LOW (ref 3.5–5.1)
Sodium: 142 mmol/L (ref 136–145)
Total Protein: 6.5 g/dL (ref 6.4–8.3)

## 2017-12-10 LAB — CBC WITH DIFFERENTIAL (CANCER CENTER ONLY)
Basophils Absolute: 0 10*3/uL (ref 0.0–0.1)
Basophils Relative: 0 %
EOS ABS: 0.1 10*3/uL (ref 0.0–0.5)
EOS PCT: 2 %
HCT: 34.4 % — ABNORMAL LOW (ref 34.8–46.6)
Hemoglobin: 11.5 g/dL — ABNORMAL LOW (ref 11.6–15.9)
LYMPHS PCT: 29 %
Lymphs Abs: 1.3 10*3/uL (ref 0.9–3.3)
MCH: 30.4 pg (ref 25.1–34.0)
MCHC: 33.4 g/dL (ref 31.5–36.0)
MCV: 91 fL (ref 79.5–101.0)
MONO ABS: 0.5 10*3/uL (ref 0.1–0.9)
Monocytes Relative: 11 %
Neutro Abs: 2.6 10*3/uL (ref 1.5–6.5)
Neutrophils Relative %: 58 %
PLATELETS: 187 10*3/uL (ref 145–400)
RBC: 3.78 MIL/uL (ref 3.70–5.45)
RDW: 15.7 % — AB (ref 11.2–14.5)
WBC: 4.4 10*3/uL (ref 3.9–10.3)

## 2017-12-10 MED ORDER — PALONOSETRON HCL INJECTION 0.25 MG/5ML
0.2500 mg | Freq: Once | INTRAVENOUS | Status: AC
  Administered 2017-12-10: 0.25 mg via INTRAVENOUS

## 2017-12-10 MED ORDER — DEXAMETHASONE SODIUM PHOSPHATE 10 MG/ML IJ SOLN
10.0000 mg | Freq: Once | INTRAMUSCULAR | Status: AC
  Administered 2017-12-10: 10 mg via INTRAVENOUS

## 2017-12-10 MED ORDER — SODIUM CHLORIDE 0.9% FLUSH
10.0000 mL | Freq: Once | INTRAVENOUS | Status: AC
  Administered 2017-12-10: 10 mL
  Filled 2017-12-10: qty 10

## 2017-12-10 MED ORDER — LEUCOVORIN CALCIUM INJECTION 350 MG
400.0000 mg/m2 | Freq: Once | INTRAMUSCULAR | Status: AC
  Administered 2017-12-10: 844 mg via INTRAVENOUS
  Filled 2017-12-10: qty 42.2

## 2017-12-10 MED ORDER — FLUOROURACIL CHEMO INJECTION 2.5 GM/50ML
400.0000 mg/m2 | Freq: Once | INTRAVENOUS | Status: AC
  Administered 2017-12-10: 850 mg via INTRAVENOUS
  Filled 2017-12-10: qty 17

## 2017-12-10 MED ORDER — DEXAMETHASONE SODIUM PHOSPHATE 10 MG/ML IJ SOLN
INTRAMUSCULAR | Status: AC
  Filled 2017-12-10: qty 1

## 2017-12-10 MED ORDER — DEXTROSE 5 % IV SOLN
Freq: Once | INTRAVENOUS | Status: AC
  Administered 2017-12-10: 11:00:00 via INTRAVENOUS

## 2017-12-10 MED ORDER — PALONOSETRON HCL INJECTION 0.25 MG/5ML
INTRAVENOUS | Status: AC
  Filled 2017-12-10: qty 5

## 2017-12-10 MED ORDER — SODIUM CHLORIDE 0.9 % IV SOLN
2375.0000 mg/m2 | INTRAVENOUS | Status: DC
  Administered 2017-12-10: 5000 mg via INTRAVENOUS
  Filled 2017-12-10: qty 100

## 2017-12-10 MED ORDER — OXALIPLATIN CHEMO INJECTION 100 MG/20ML
85.0000 mg/m2 | Freq: Once | INTRAVENOUS | Status: AC
  Administered 2017-12-10: 180 mg via INTRAVENOUS
  Filled 2017-12-10: qty 36

## 2017-12-10 NOTE — Telephone Encounter (Signed)
Scheduled appt per 4/3 los - Gave patient AVS and calender per los.  

## 2017-12-10 NOTE — Progress Notes (Signed)
  [facility] OFFICE PROGRESS NOTE   Diagnosis: Colon cancer  INTERVAL HISTORY:   Sharon Allison returns as scheduled.  She completed another cycle of FOLFOX 11/27/2017.  She reports mild prolonged cold sensitivity.  No other neuropathy symptoms.  No nausea/vomiting or diarrhea.  Objective:  Vital signs in last 24 hours:  Blood pressure 140/83, pulse 75, temperature 97.7 F (36.5 C), temperature source Oral, resp. rate 18, height 5' 3" (1.6 m), weight 217 lb 11.2 oz (98.7 kg), last menstrual period 07/10/2001, SpO2 98 %.    HEENT: No thrush or ulcers Resp: Lungs clear bilaterally Cardio: Regular rate and rhythm GI: No hepatomegaly, nontender Vascular: No leg edema Neuro: Very mild loss of vibratory sense at the fingertips bilaterally Skin: Mild hyperpigmentation and skin thickening of the hands  Portacath/PICC-without erythema  Lab Results:  Lab Results  Component Value Date   WBC 4.4 12/10/2017   HGB 11.6 (L) 09/25/2017   HCT 34.4 (L) 12/10/2017   MCV 91.0 12/10/2017   PLT 187 12/10/2017   NEUTROABS 2.6 12/10/2017    CMP     Component Value Date/Time   NA 142 12/10/2017 0912   NA 138 04/04/2017 1009   K 3.3 (L) 12/10/2017 0912   CL 108 12/10/2017 0912   CO2 25 12/10/2017 0912   GLUCOSE 114 12/10/2017 0912   BUN 9 12/10/2017 0912   BUN 13 04/04/2017 1009   CREATININE 0.74 12/10/2017 0912   CREATININE 0.62 03/29/2016 1602   CALCIUM 11.0 (H) 12/10/2017 0912   PROT 6.5 12/10/2017 0912   PROT 6.7 04/04/2017 1009   ALBUMIN 3.4 (L) 12/10/2017 0912   ALBUMIN 4.3 04/04/2017 1009   AST 17 12/10/2017 0912   ALT 18 12/10/2017 0912   ALKPHOS 112 12/10/2017 0912   BILITOT 0.4 12/10/2017 0912   GFRNONAA >60 12/10/2017 0912   GFRAA >60 12/10/2017 0912    Lab Results  Component Value Date   CEA1 <1.00 10/21/2017    Medications: I have reviewed the patient's current medications.   Assessment/Plan: 1. Adenocarcinoma of the cecum, stage IIIa (T1,N1b),  status post a right colectomy 09/18/2017 ? MSI-stable ? 3/14 lymph nodes positive for metastatic carcinoma, lymphovascular invasion present ? Cycle 1 FOLFOX 10/22/2017 ? CTs chest, abdomen, and pelvis 10/28/2017-negative for metastatic disease ? Cycle 2 FOLFOX 11/05/2017 ? Cycle 3 FOLFOX 11/27/2017 ? Cycle 4 FOLFOX 12/10/2017  2.Asthma  3.Port-A-Cath placement 10/17/2017  4.Neutropenia secondary to chemotherapy-resolved   Disposition: Sharon Allison is tolerating the chemotherapy well.  She will complete cycle 4 adjuvant FOLFOX today.  She will return for an office visit and chemotherapy in 2 weeks.  15 minutes were spent with the patient today.  The majority of the time was used for counseling and coordination of care.  Betsy Coder, MD  12/10/2017  10:05 AM

## 2017-12-10 NOTE — Patient Instructions (Signed)
[facility] Discharge Instructions for Patients Receiving Chemotherapy  Today you received the following chemotherapy agents Oxaliplatin, Leucovorin and Adrucil   To help prevent nausea and vomiting after your treatment, we encourage you to take your nausea medication as directed. No Zofran for 3 days. Take Compazine instead.     If you develop nausea and vomiting that is not controlled by your nausea medication, call the clinic.   BELOW ARE SYMPTOMS THAT SHOULD BE REPORTED IMMEDIATELY:  *FEVER GREATER THAN 100.5 F  *CHILLS WITH OR WITHOUT FEVER  NAUSEA AND VOMITING THAT IS NOT CONTROLLED WITH YOUR NAUSEA MEDICATION  *UNUSUAL SHORTNESS OF BREATH  *UNUSUAL BRUISING OR BLEEDING  TENDERNESS IN MOUTH AND THROAT WITH OR WITHOUT PRESENCE OF ULCERS  *URINARY PROBLEMS  *BOWEL PROBLEMS  UNUSUAL RASH Items with * indicate a potential emergency and should be followed up as soon as possible.  Feel free to call the clinic should you have any questions or concerns. The clinic phone number is [phone].  Please show the [facility] at check-in to the Emergency Department and triage nurse.

## 2017-12-11 NOTE — Progress Notes (Signed)
Udenyca approved. MD aware. Will hold Udenyca this cycle - may add in the future per Dr. Benay Spice.  Raul Del, PharmD, BCPS, BCOP

## 2017-12-12 ENCOUNTER — Inpatient Hospital Stay: Payer: BC Managed Care – PPO

## 2017-12-12 DIAGNOSIS — Z5189 Encounter for other specified aftercare: Secondary | ICD-10-CM | POA: Diagnosis not present

## 2017-12-12 DIAGNOSIS — Z95828 Presence of other vascular implants and grafts: Secondary | ICD-10-CM

## 2017-12-12 DIAGNOSIS — C182 Malignant neoplasm of ascending colon: Secondary | ICD-10-CM

## 2017-12-12 MED ORDER — SODIUM CHLORIDE 0.9% FLUSH
10.0000 mL | Freq: Once | INTRAVENOUS | Status: AC
  Administered 2017-12-12: 10 mL
  Filled 2017-12-12: qty 10

## 2017-12-12 MED ORDER — HEPARIN SOD (PORK) LOCK FLUSH 100 UNIT/ML IV SOLN
500.0000 [IU] | Freq: Once | INTRAVENOUS | Status: AC
  Administered 2017-12-12: 500 [IU]
  Filled 2017-12-12: qty 5

## 2017-12-12 NOTE — Patient Instructions (Signed)

## 2017-12-21 ENCOUNTER — Other Ambulatory Visit: Payer: Self-pay | Admitting: Oncology

## 2017-12-22 ENCOUNTER — Encounter (HOSPITAL_COMMUNITY): Payer: Self-pay | Admitting: Surgery

## 2017-12-24 ENCOUNTER — Inpatient Hospital Stay: Payer: BC Managed Care – PPO

## 2017-12-24 ENCOUNTER — Encounter: Payer: Self-pay | Admitting: Nurse Practitioner

## 2017-12-24 ENCOUNTER — Inpatient Hospital Stay (HOSPITAL_BASED_OUTPATIENT_CLINIC_OR_DEPARTMENT_OTHER): Payer: BC Managed Care – PPO | Admitting: Nurse Practitioner

## 2017-12-24 ENCOUNTER — Telehealth: Payer: Self-pay

## 2017-12-24 VITALS — BP 148/81 | HR 64 | Temp 97.5°F | Resp 18 | Ht 63.0 in | Wt 215.2 lb

## 2017-12-24 DIAGNOSIS — C182 Malignant neoplasm of ascending colon: Secondary | ICD-10-CM

## 2017-12-24 DIAGNOSIS — Z95828 Presence of other vascular implants and grafts: Secondary | ICD-10-CM

## 2017-12-24 DIAGNOSIS — Z5189 Encounter for other specified aftercare: Secondary | ICD-10-CM | POA: Diagnosis not present

## 2017-12-24 DIAGNOSIS — C180 Malignant neoplasm of cecum: Secondary | ICD-10-CM

## 2017-12-24 LAB — CBC WITH DIFFERENTIAL (CANCER CENTER ONLY)
BASOS ABS: 0 10*3/uL (ref 0.0–0.1)
Basophils Relative: 0 %
EOS PCT: 2 %
Eosinophils Absolute: 0.1 10*3/uL (ref 0.0–0.5)
HCT: 34.7 % — ABNORMAL LOW (ref 34.8–46.6)
Hemoglobin: 11.6 g/dL (ref 11.6–15.9)
Lymphocytes Relative: 43 %
Lymphs Abs: 1.7 10*3/uL (ref 0.9–3.3)
MCH: 30.6 pg (ref 25.1–34.0)
MCHC: 33.4 g/dL (ref 31.5–36.0)
MCV: 91.6 fL (ref 79.5–101.0)
MONO ABS: 0.5 10*3/uL (ref 0.1–0.9)
Monocytes Relative: 13 %
Neutro Abs: 1.6 10*3/uL (ref 1.5–6.5)
Neutrophils Relative %: 42 %
PLATELETS: 167 10*3/uL (ref 145–400)
RBC: 3.79 MIL/uL (ref 3.70–5.45)
RDW: 16.1 % — AB (ref 11.2–14.5)
WBC Count: 3.9 10*3/uL (ref 3.9–10.3)

## 2017-12-24 LAB — CMP (CANCER CENTER ONLY)
ALK PHOS: 103 U/L (ref 40–150)
ALT: 20 U/L (ref 0–55)
AST: 21 U/L (ref 5–34)
Albumin: 3.4 g/dL — ABNORMAL LOW (ref 3.5–5.0)
Anion gap: 6 (ref 3–11)
BILIRUBIN TOTAL: 0.4 mg/dL (ref 0.2–1.2)
BUN: 10 mg/dL (ref 7–26)
CALCIUM: 11.2 mg/dL — AB (ref 8.4–10.4)
CO2: 26 mmol/L (ref 22–29)
Chloride: 106 mmol/L (ref 98–109)
Creatinine: 0.74 mg/dL (ref 0.60–1.10)
GFR, Est AFR Am: >60 mL/min (ref >60)
GFR, Estimated: >60 mL/min (ref >60)
GLUCOSE: 107 mg/dL (ref 70–140)
POTASSIUM: 3.5 mmol/L (ref 3.5–5.1)
Sodium: 138 mmol/L (ref 136–145)
TOTAL PROTEIN: 6.5 g/dL (ref 6.4–8.3)

## 2017-12-24 MED ORDER — OXALIPLATIN CHEMO INJECTION 100 MG/20ML
85.0000 mg/m2 | Freq: Once | INTRAVENOUS | Status: AC
  Administered 2017-12-24: 180 mg via INTRAVENOUS
  Filled 2017-12-24: qty 36

## 2017-12-24 MED ORDER — FLUOROURACIL CHEMO INJECTION 2.5 GM/50ML
400.0000 mg/m2 | Freq: Once | INTRAVENOUS | Status: AC
  Administered 2017-12-24: 850 mg via INTRAVENOUS
  Filled 2017-12-24: qty 17

## 2017-12-24 MED ORDER — DEXAMETHASONE SODIUM PHOSPHATE 10 MG/ML IJ SOLN
INTRAMUSCULAR | Status: AC
  Filled 2017-12-24: qty 1

## 2017-12-24 MED ORDER — DEXTROSE 5 % IV SOLN
Freq: Once | INTRAVENOUS | Status: AC
  Administered 2017-12-24: 14:00:00 via INTRAVENOUS

## 2017-12-24 MED ORDER — PALONOSETRON HCL INJECTION 0.25 MG/5ML
INTRAVENOUS | Status: AC
  Filled 2017-12-24: qty 5

## 2017-12-24 MED ORDER — PALONOSETRON HCL INJECTION 0.25 MG/5ML
0.2500 mg | Freq: Once | INTRAVENOUS | Status: AC
  Administered 2017-12-24: 0.25 mg via INTRAVENOUS

## 2017-12-24 MED ORDER — SODIUM CHLORIDE 0.9 % IV SOLN
2375.0000 mg/m2 | INTRAVENOUS | Status: DC
  Administered 2017-12-24: 5000 mg via INTRAVENOUS
  Filled 2017-12-24: qty 100

## 2017-12-24 MED ORDER — LEUCOVORIN CALCIUM INJECTION 350 MG
400.0000 mg/m2 | Freq: Once | INTRAVENOUS | Status: AC
  Administered 2017-12-24: 844 mg via INTRAVENOUS
  Filled 2017-12-24: qty 42.2

## 2017-12-24 MED ORDER — SODIUM CHLORIDE 0.9% FLUSH
10.0000 mL | Freq: Once | INTRAVENOUS | Status: AC
  Administered 2017-12-24: 10 mL
  Filled 2017-12-24: qty 10

## 2017-12-24 MED ORDER — DEXAMETHASONE SODIUM PHOSPHATE 10 MG/ML IJ SOLN
10.0000 mg | Freq: Once | INTRAMUSCULAR | Status: AC
  Administered 2017-12-24: 10 mg via INTRAVENOUS

## 2017-12-24 NOTE — Telephone Encounter (Signed)
Printed avs and calender of upcoming appointments. Date change due to infusion aval. Per 4/17 los

## 2017-12-24 NOTE — Progress Notes (Signed)
  [facility] OFFICE PROGRESS NOTE   Diagnosis: Colon cancer  INTERVAL HISTORY:   Sharon Allison returns as scheduled.  She completed cycle 4 FOLFOX 12/10/2017.  Denies nausea/vomiting.  No mouth sores.  No diarrhea.  She noted prolonged cold sensitivity.  Feet occasionally feel numb/tingly.  During the infusion she noted that her "eyes and lips felt funny".  She has occasional blurred vision.  Objective:  Vital signs in last 24 hours:  Blood pressure (!) 148/81, pulse 64, temperature (!) 97.5 F (36.4 C), temperature source Oral, resp. rate 18, height '5\' 3"'$  (1.6 m), weight 215 lb 3.2 oz (97.6 kg), last menstrual period 07/10/2001, SpO2 100 %.    HEENT: No thrush or ulcers. Resp: Lungs clear bilaterally. Cardio: Regular rate and rhythm. GI: Abdomen soft and nontender.  No hepatomegaly. Vascular: No leg edema. Neuro: Minimal decrease in vibratory sense over the fingertips per tuning fork exam. Skin: Palms without erythema. Port-A-Cath without erythema.   Lab Results:  Lab Results  Component Value Date   WBC 3.9 12/24/2017   HGB 11.6 (L) 09/25/2017   HCT 34.7 (L) 12/24/2017   MCV 91.6 12/24/2017   PLT 167 12/24/2017   NEUTROABS 1.6 12/24/2017    Imaging:  No results found.  Medications: I have reviewed the patient's current medications.  Assessment/Plan: 1. 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 MSI-stable ? 3/14 lymph nodes positive for metastatic carcinoma, lymphovascular invasion present ? Cycle 1 FOLFOX 10/22/2017 ? CTs chest, abdomen, and pelvis 10/28/2017-negative for metastatic disease ? Cycle 2 FOLFOX 11/05/2017 ? Cycle 3 FOLFOX 11/27/2017 ? Cycle 4 FOLFOX 12/10/2017 ? Cycle 5 FOLFOX 12/24/2017  2.Asthma  3.Port-A-Cath placement 10/17/2017  4.Neutropenia secondary to chemotherapy-resolved    Disposition: Sharon Allison appears stable.  She has completed 4 cycles of FOLFOX.  Plan to  proceed with cycle 5 today as scheduled.  We reviewed today's labs.  The [place] is at the low end of the normal range.  We will plan to administer Udenyca on the day of pump discontinuation.  We reviewed potential toxicities including bone pain, rash, splenic rupture.  She is in agreement.  She understands to contact the office with fever, chills, other signs of infection.  She will return for lab, follow-up and cycle 6 FOLFOX in 2 weeks.  She will contact the office in the interim as outlined above or with any other problems.  Plan reviewed with Dr. Benay Spice.  Ned Card ANP/GNP-BC   12/24/2017  12:33 PM

## 2017-12-24 NOTE — Patient Instructions (Signed)
[facility] Discharge Instructions for Patients Receiving Chemotherapy  Today you received the following chemotherapy agents Oxaliplatin, Leucovorin and Adrucil   To help prevent nausea and vomiting after your treatment, we encourage you to take your nausea medication as directed. No Zofran for 3 days. Take Compazine instead.     If you develop nausea and vomiting that is not controlled by your nausea medication, call the clinic.   BELOW ARE SYMPTOMS THAT SHOULD BE REPORTED IMMEDIATELY:  *FEVER GREATER THAN 100.5 F  *CHILLS WITH OR WITHOUT FEVER  NAUSEA AND VOMITING THAT IS NOT CONTROLLED WITH YOUR NAUSEA MEDICATION  *UNUSUAL SHORTNESS OF BREATH  *UNUSUAL BRUISING OR BLEEDING  TENDERNESS IN MOUTH AND THROAT WITH OR WITHOUT PRESENCE OF ULCERS  *URINARY PROBLEMS  *BOWEL PROBLEMS  UNUSUAL RASH Items with * indicate a potential emergency and should be followed up as soon as possible.  Feel free to call the clinic should you have any questions or concerns. The clinic phone number is [phone].  Please show the [facility] at check-in to the Emergency Department and triage nurse.

## 2017-12-26 ENCOUNTER — Inpatient Hospital Stay: Payer: BC Managed Care – PPO

## 2017-12-26 VITALS — BP 142/78 | HR 64 | Temp 98.6°F | Resp 18

## 2017-12-26 DIAGNOSIS — Z5189 Encounter for other specified aftercare: Secondary | ICD-10-CM | POA: Diagnosis not present

## 2017-12-26 DIAGNOSIS — C182 Malignant neoplasm of ascending colon: Secondary | ICD-10-CM

## 2017-12-26 MED ORDER — SODIUM CHLORIDE 0.9% FLUSH
10.0000 mL | INTRAVENOUS | Status: DC | PRN
  Administered 2017-12-26: 10 mL
  Filled 2017-12-26: qty 10

## 2017-12-26 MED ORDER — HEPARIN SOD (PORK) LOCK FLUSH 100 UNIT/ML IV SOLN
500.0000 [IU] | Freq: Once | INTRAVENOUS | Status: AC | PRN
  Administered 2017-12-26: 500 [IU]
  Filled 2017-12-26: qty 5

## 2017-12-26 MED ORDER — PEGFILGRASTIM-CBQV 6 MG/0.6ML ~~LOC~~ SOSY
6.0000 mg | PREFILLED_SYRINGE | Freq: Once | SUBCUTANEOUS | Status: AC
  Administered 2017-12-26: 6 mg via SUBCUTANEOUS

## 2017-12-26 MED ORDER — PEGFILGRASTIM-CBQV 6 MG/0.6ML ~~LOC~~ SOSY
PREFILLED_SYRINGE | SUBCUTANEOUS | Status: AC
  Filled 2017-12-26: qty 0.6

## 2017-12-26 NOTE — Patient Instructions (Signed)
Pegfilgrastim injection What is this medicine? PEGFILGRASTIM (PEG fil gra stim) is a long-acting granulocyte colony-stimulating factor that stimulates the growth of neutrophils, a type of white blood cell important in the body's fight against infection. It is used to reduce the incidence of fever and infection in patients with certain types of cancer who are receiving chemotherapy that affects the bone marrow, and to increase survival after being exposed to high doses of radiation. This medicine may be used for other purposes; ask your health care provider or pharmacist if you have questions. COMMON BRAND NAME(S): Neulasta What should I tell my health care provider before I take this medicine? They need to know if you have any of these conditions: -kidney disease -latex allergy -ongoing radiation therapy -sickle cell disease -skin reactions to acrylic adhesives (On-Body Injector only) -an unusual or allergic reaction to pegfilgrastim, filgrastim, other medicines, foods, dyes, or preservatives -pregnant or trying to get pregnant -breast-feeding How should I use this medicine? This medicine is for injection under the skin. If you get this medicine at home, you will be taught how to prepare and give the pre-filled syringe or how to use the On-body Injector. Refer to the patient Instructions for Use for detailed instructions. Use exactly as directed. Tell your healthcare provider immediately if you suspect that the On-body Injector may not have performed as intended or if you suspect the use of the On-body Injector resulted in a missed or partial dose. It is important that you put your used needles and syringes in a special sharps container. Do not put them in a trash can. If you do not have a sharps container, call your pharmacist or healthcare provider to get one. Talk to your pediatrician regarding the use of this medicine in children. While this drug may be prescribed for selected conditions,  precautions do apply. Overdosage: If you think you have taken too much of this medicine contact a poison control center or emergency room at once. NOTE: This medicine is only for you. Do not share this medicine with others. What if I miss a dose? It is important not to miss your dose. Call your doctor or health care professional if you miss your dose. If you miss a dose due to an On-body Injector failure or leakage, a new dose should be administered as soon as possible using a single prefilled syringe for manual use. What may interact with this medicine? Interactions have not been studied. Give your health care provider a list of all the medicines, herbs, non-prescription drugs, or dietary supplements you use. Also tell them if you smoke, drink alcohol, or use illegal drugs. Some items may interact with your medicine. This list may not describe all possible interactions. Give your health care provider a list of all the medicines, herbs, non-prescription drugs, or dietary supplements you use. Also tell them if you smoke, drink alcohol, or use illegal drugs. Some items may interact with your medicine. What should I watch for while using this medicine? You may need blood work done while you are taking this medicine. If you are going to need a MRI, CT scan, or other procedure, tell your doctor that you are using this medicine (On-Body Injector only). What side effects may I notice from receiving this medicine? Side effects that you should report to your doctor or health care professional as soon as possible: -allergic reactions like skin rash, itching or hives, swelling of the face, lips, or tongue -dizziness -fever -pain, redness, or irritation at site   where injected -pinpoint red spots on the skin -red or dark-brown urine -shortness of breath or breathing problems -stomach or side pain, or pain at the shoulder -swelling -tiredness -trouble passing urine or change in the amount of urine Side  effects that usually do not require medical attention (report to your doctor or health care professional if they continue or are bothersome): -bone pain -muscle pain This list may not describe all possible side effects. Call your doctor for medical advice about side effects. You may report side effects to FDA at 1-800-FDA-1088. Where should I keep my medicine? Keep out of the reach of children. Store pre-filled syringes in a refrigerator between 2 and 8 degrees C (36 and 46 degrees F). Do not freeze. Keep in carton to protect from light. Throw away this medicine if it is left out of the refrigerator for more than 48 hours. Throw away any unused medicine after the expiration date. NOTE: This sheet is a summary. It may not cover all possible information. If you have questions about this medicine, talk to your doctor, pharmacist, or health care provider.  2018 Elsevier/Gold Standard (2016-08-22 12:58:03)  

## 2018-01-04 ENCOUNTER — Other Ambulatory Visit: Payer: Self-pay | Admitting: Oncology

## 2018-01-08 ENCOUNTER — Other Ambulatory Visit: Payer: Self-pay | Admitting: Nurse Practitioner

## 2018-01-08 ENCOUNTER — Inpatient Hospital Stay: Payer: BC Managed Care – PPO

## 2018-01-08 ENCOUNTER — Encounter: Payer: Self-pay | Admitting: Nurse Practitioner

## 2018-01-08 ENCOUNTER — Inpatient Hospital Stay: Payer: BC Managed Care – PPO | Attending: Oncology | Admitting: Nurse Practitioner

## 2018-01-08 ENCOUNTER — Telehealth: Payer: Self-pay | Admitting: Nurse Practitioner

## 2018-01-08 VITALS — BP 161/77 | HR 70 | Temp 98.1°F | Resp 18 | Ht 63.0 in | Wt 215.0 lb

## 2018-01-08 DIAGNOSIS — C180 Malignant neoplasm of cecum: Secondary | ICD-10-CM | POA: Insufficient documentation

## 2018-01-08 DIAGNOSIS — Z5111 Encounter for antineoplastic chemotherapy: Secondary | ICD-10-CM | POA: Insufficient documentation

## 2018-01-08 DIAGNOSIS — C182 Malignant neoplasm of ascending colon: Secondary | ICD-10-CM

## 2018-01-08 DIAGNOSIS — Z5189 Encounter for other specified aftercare: Secondary | ICD-10-CM | POA: Diagnosis present

## 2018-01-08 DIAGNOSIS — D696 Thrombocytopenia, unspecified: Secondary | ICD-10-CM | POA: Diagnosis not present

## 2018-01-08 DIAGNOSIS — Z452 Encounter for adjustment and management of vascular access device: Secondary | ICD-10-CM | POA: Insufficient documentation

## 2018-01-08 DIAGNOSIS — G620 Drug-induced polyneuropathy: Secondary | ICD-10-CM | POA: Diagnosis not present

## 2018-01-08 DIAGNOSIS — D701 Agranulocytosis secondary to cancer chemotherapy: Secondary | ICD-10-CM | POA: Insufficient documentation

## 2018-01-08 DIAGNOSIS — Z95828 Presence of other vascular implants and grafts: Secondary | ICD-10-CM

## 2018-01-08 LAB — CMP (CANCER CENTER ONLY)
ALK PHOS: 146 U/L (ref 40–150)
ALT: 22 U/L (ref 0–55)
AST: 28 U/L (ref 5–34)
Albumin: 3.6 g/dL (ref 3.5–5.0)
Anion gap: 9 (ref 3–11)
BUN: 8 mg/dL (ref 7–26)
CALCIUM: 11.5 mg/dL — AB (ref 8.4–10.4)
CO2: 24 mmol/L (ref 22–29)
Chloride: 106 mmol/L (ref 98–109)
Creatinine: 0.76 mg/dL (ref 0.60–1.10)
GFR, Est AFR Am: >60 mL/min (ref >60)
GFR, Estimated: >60 mL/min (ref >60)
GLUCOSE: 121 mg/dL (ref 70–140)
Potassium: 3.3 mmol/L — ABNORMAL LOW (ref 3.5–5.1)
SODIUM: 139 mmol/L (ref 136–145)
Total Bilirubin: 0.4 mg/dL (ref 0.2–1.2)
Total Protein: 6.9 g/dL (ref 6.4–8.3)

## 2018-01-08 LAB — CBC WITH DIFFERENTIAL (CANCER CENTER ONLY)
BASOS PCT: 0 %
Basophils Absolute: 0 10*3/uL (ref 0.0–0.1)
EOS ABS: 0.1 10*3/uL (ref 0.0–0.5)
Eosinophils Relative: 1 %
HCT: 36.8 % (ref 34.8–46.6)
Hemoglobin: 12.2 g/dL (ref 11.6–15.9)
Lymphocytes Relative: 24 %
Lymphs Abs: 2.4 10*3/uL (ref 0.9–3.3)
MCH: 31.1 pg (ref 25.1–34.0)
MCHC: 33.2 g/dL (ref 31.5–36.0)
MCV: 93.9 fL (ref 79.5–101.0)
MONO ABS: 0.5 10*3/uL (ref 0.1–0.9)
MONOS PCT: 5 %
Neutro Abs: 7.2 10*3/uL — ABNORMAL HIGH (ref 1.5–6.5)
Neutrophils Relative %: 70 %
Platelet Count: 82 10*3/uL — ABNORMAL LOW (ref 145–400)
RBC: 3.92 MIL/uL (ref 3.70–5.45)
RDW: 17.4 % — AB (ref 11.2–14.5)
WBC Count: 10.2 10*3/uL (ref 3.9–10.3)

## 2018-01-08 MED ORDER — LEUCOVORIN CALCIUM INJECTION 350 MG
400.0000 mg/m2 | Freq: Once | INTRAVENOUS | Status: AC
  Administered 2018-01-08: 844 mg via INTRAVENOUS
  Filled 2018-01-08: qty 42.2

## 2018-01-08 MED ORDER — DEXTROSE 5 % IV SOLN
Freq: Once | INTRAVENOUS | Status: AC
  Administered 2018-01-08: 12:00:00 via INTRAVENOUS

## 2018-01-08 MED ORDER — SODIUM CHLORIDE 0.9% FLUSH
10.0000 mL | Freq: Once | INTRAVENOUS | Status: AC
  Administered 2018-01-08: 10 mL
  Filled 2018-01-08: qty 10

## 2018-01-08 MED ORDER — SODIUM CHLORIDE 0.9 % IV SOLN
2380.0000 mg/m2 | INTRAVENOUS | Status: DC
  Administered 2018-01-08: 5000 mg via INTRAVENOUS
  Filled 2018-01-08: qty 100

## 2018-01-08 MED ORDER — POTASSIUM CHLORIDE CRYS ER 20 MEQ PO TBCR: 20.0000 meq | EXTENDED_RELEASE_TABLET | Freq: Every day | ORAL | 1 refills | Status: DC

## 2018-01-08 MED ORDER — FLUOROURACIL CHEMO INJECTION 2.5 GM/50ML
400.0000 mg/m2 | Freq: Once | INTRAVENOUS | Status: AC
  Administered 2018-01-08: 850 mg via INTRAVENOUS
  Filled 2018-01-08: qty 17

## 2018-01-08 NOTE — Progress Notes (Signed)
Ok to treat with plt 82 per Lattie Haw NP. Oxaliplatin held today.

## 2018-01-08 NOTE — Progress Notes (Signed)
  [facility] OFFICE PROGRESS NOTE   Diagnosis: Colon cancer  INTERVAL HISTORY:   Sharon Allison returns as scheduled.  She completed cycle 5 FOLFOX 12/24/2017.  She denies nausea/vomiting.  No mouth sores.  No diarrhea.  Cold sensitivity typically lasts 5 or 6 days.  No persistent neuropathy symptoms.  No bone pain following white cell growth factor support.  She intermittently feels dizzy.  Objective:  Vital signs in last 24 hours:  Blood pressure (!) 161/77, pulse 70, temperature 98.1 F (36.7 C), temperature source Oral, resp. rate 18, height '5\' 3"'$  (1.6 m), weight 215 lb (97.5 kg), last menstrual period 07/10/2001, SpO2 100 %.    HEENT: No thrush or ulcers. Resp: Lungs clear bilaterally. Cardio: Regular rate and rhythm. GI: Abdomen soft and nontender.  No hepatomegaly. Vascular: No leg edema. Neuro: Vibratory sense mildly decreased over the fingertips per tuning fork exam. Skin: Palms with hyperpigmentation, no erythema. Port-A-Cath without erythema.   Lab Results:  Lab Results  Component Value Date   WBC 10.2 01/08/2018   HGB 12.2 01/08/2018   HCT 36.8 01/08/2018   MCV 93.9 01/08/2018   PLT 82 (L) 01/08/2018   NEUTROABS 7.2 (H) 01/08/2018    Imaging:  No results found.  Medications: I have reviewed the patient's current medications.  Assessment/Plan: 1. 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 MSI-stable ? 3/14 lymph nodes positive for metastatic carcinoma, lymphovascular invasion present ? Cycle 1 FOLFOX 10/22/2017 ? CTs chest, abdomen, and pelvis 10/28/2017-negative for metastatic disease ? Cycle 2 FOLFOX 11/05/2017 ? Cycle 3 FOLFOX 11/27/2017 ? Cycle 4 FOLFOX 12/10/2017 ? Cycle 5 FOLFOX 12/24/2017 ? Cycle 6 FOLFOX 01/08/2018 (Oxaliplatin held due to thrombocytopenia)  2.Asthma  3.Port-A-Cath placement 10/17/2017  4.Neutropenia secondary to chemotherapy-resolved  5.   Thrombocytopenia  secondary to chemotherapy    Disposition: Sharon Allison appears stable.  She has completed 5 cycles of FOLFOX.  Plan to proceed with cycle 6 today as scheduled.  Oxaliplatin will be held due to thrombocytopenia.  She understands to contact the office with any bleeding.  She will not require G-CSF with this cycel.  She will return for lab, follow-up and cycle 7 FOLFOX in 2 weeks.  She will contact the office in the interim with any problems.  Plan reviewed with Dr. Benay Spice.    Ned Card ANP/GNP-BC   01/08/2018  10:38 AM

## 2018-01-08 NOTE — Telephone Encounter (Signed)
Scheduled appt per 5/2 los - Gave patient AVS and calender per los.  

## 2018-01-08 NOTE — Patient Instructions (Signed)
[facility] [place] Discharge Instructions for Patients Receiving Chemotherapy  Today you received the following chemotherapy agents Oxaliplatin, Leucovorin and Adrucil   To help prevent nausea and vomiting after your treatment, we encourage you to take your nausea medication as directed. No Zofran for 3 days. Take Compazine instead.     If you develop nausea and vomiting that is not controlled by your nausea medication, call the clinic.   BELOW ARE SYMPTOMS THAT SHOULD BE REPORTED IMMEDIATELY:  *FEVER GREATER THAN 100.5 F  *CHILLS WITH OR WITHOUT FEVER  NAUSEA AND VOMITING THAT IS NOT CONTROLLED WITH YOUR NAUSEA MEDICATION  *UNUSUAL SHORTNESS OF BREATH  *UNUSUAL BRUISING OR BLEEDING  TENDERNESS IN MOUTH AND THROAT WITH OR WITHOUT PRESENCE OF ULCERS  *URINARY PROBLEMS  *BOWEL PROBLEMS  UNUSUAL RASH Items with * indicate a potential emergency and should be followed up as soon as possible.  Feel free to call the clinic should you have any questions or concerns. The clinic phone number is [phone].  Please show the [place] at check-in to the Emergency Department and triage nurse.

## 2018-01-10 ENCOUNTER — Inpatient Hospital Stay: Payer: BC Managed Care – PPO

## 2018-01-10 ENCOUNTER — Other Ambulatory Visit: Payer: Self-pay | Admitting: Allergy

## 2018-01-10 VITALS — BP 117/80 | HR 79 | Temp 98.3°F | Resp 18

## 2018-01-10 DIAGNOSIS — J4520 Mild intermittent asthma, uncomplicated: Secondary | ICD-10-CM

## 2018-01-10 DIAGNOSIS — C182 Malignant neoplasm of ascending colon: Secondary | ICD-10-CM

## 2018-01-10 DIAGNOSIS — Z5111 Encounter for antineoplastic chemotherapy: Secondary | ICD-10-CM | POA: Diagnosis not present

## 2018-01-10 MED ORDER — SODIUM CHLORIDE 0.9% FLUSH
10.0000 mL | INTRAVENOUS | Status: DC | PRN
  Administered 2018-01-10: 10 mL
  Filled 2018-01-10: qty 10

## 2018-01-10 MED ORDER — HEPARIN SOD (PORK) LOCK FLUSH 100 UNIT/ML IV SOLN
500.0000 [IU] | Freq: Once | INTRAVENOUS | Status: AC | PRN
  Administered 2018-01-10: 500 [IU]
  Filled 2018-01-10: qty 5

## 2018-01-18 ENCOUNTER — Other Ambulatory Visit: Payer: Self-pay | Admitting: Oncology

## 2018-01-21 ENCOUNTER — Telehealth: Payer: Self-pay | Admitting: Oncology

## 2018-01-21 ENCOUNTER — Inpatient Hospital Stay: Payer: BC Managed Care – PPO

## 2018-01-21 ENCOUNTER — Encounter: Payer: Self-pay | Admitting: *Deleted

## 2018-01-21 ENCOUNTER — Inpatient Hospital Stay (HOSPITAL_BASED_OUTPATIENT_CLINIC_OR_DEPARTMENT_OTHER): Payer: BC Managed Care – PPO | Admitting: Oncology

## 2018-01-21 ENCOUNTER — Inpatient Hospital Stay (HOSPITAL_BASED_OUTPATIENT_CLINIC_OR_DEPARTMENT_OTHER): Payer: BC Managed Care – PPO | Admitting: Medical

## 2018-01-21 VITALS — BP 138/88 | HR 63 | Temp 98.4°F | Resp 16 | Ht 63.0 in | Wt 213.6 lb

## 2018-01-21 DIAGNOSIS — G620 Drug-induced polyneuropathy: Secondary | ICD-10-CM | POA: Diagnosis not present

## 2018-01-21 DIAGNOSIS — D701 Agranulocytosis secondary to cancer chemotherapy: Secondary | ICD-10-CM | POA: Diagnosis not present

## 2018-01-21 DIAGNOSIS — C180 Malignant neoplasm of cecum: Secondary | ICD-10-CM

## 2018-01-21 DIAGNOSIS — C182 Malignant neoplasm of ascending colon: Secondary | ICD-10-CM

## 2018-01-21 DIAGNOSIS — T8090XA Unspecified complication following infusion and therapeutic injection, initial encounter: Secondary | ICD-10-CM

## 2018-01-21 DIAGNOSIS — Z5111 Encounter for antineoplastic chemotherapy: Secondary | ICD-10-CM | POA: Diagnosis not present

## 2018-01-21 LAB — CMP (CANCER CENTER ONLY)
ALK PHOS: 129 U/L (ref 40–150)
ALT: 18 U/L (ref 0–55)
AST: 22 U/L (ref 5–34)
Albumin: 3.6 g/dL (ref 3.5–5.0)
Anion gap: 6 (ref 3–11)
BILIRUBIN TOTAL: 0.4 mg/dL (ref 0.2–1.2)
BUN: 9 mg/dL (ref 7–26)
CALCIUM: 11.4 mg/dL — AB (ref 8.4–10.4)
CO2: 26 mmol/L (ref 22–29)
CREATININE: 0.73 mg/dL (ref 0.60–1.10)
Chloride: 105 mmol/L (ref 98–109)
GFR, Est AFR Am: >60 mL/min (ref >60)
GLUCOSE: 106 mg/dL (ref 70–140)
Potassium: 3.8 mmol/L (ref 3.5–5.1)
Sodium: 137 mmol/L (ref 136–145)
TOTAL PROTEIN: 6.9 g/dL (ref 6.4–8.3)

## 2018-01-21 LAB — CBC WITH DIFFERENTIAL (CANCER CENTER ONLY)
Basophils Absolute: 0 10*3/uL (ref 0.0–0.1)
Basophils Relative: 0 %
EOS ABS: 0.1 10*3/uL (ref 0.0–0.5)
Eosinophils Relative: 1 %
HEMATOCRIT: 34.6 % — AB (ref 34.8–46.6)
Hemoglobin: 11.7 g/dL (ref 11.6–15.9)
Lymphocytes Relative: 36 %
Lymphs Abs: 1.6 10*3/uL (ref 0.9–3.3)
MCH: 31.6 pg (ref 25.1–34.0)
MCHC: 33.9 g/dL (ref 31.5–36.0)
MCV: 93.3 fL (ref 79.5–101.0)
MONOS PCT: 12 %
Monocytes Absolute: 0.5 10*3/uL (ref 0.1–0.9)
Neutro Abs: 2.3 10*3/uL (ref 1.5–6.5)
Neutrophils Relative %: 51 %
Platelet Count: 208 10*3/uL (ref 145–400)
RBC: 3.71 MIL/uL (ref 3.70–5.45)
RDW: 17.6 % — ABNORMAL HIGH (ref 11.2–14.5)
WBC Count: 4.5 10*3/uL (ref 3.9–10.3)

## 2018-01-21 MED ORDER — OXALIPLATIN CHEMO INJECTION 100 MG/20ML
85.0000 mg/m2 | Freq: Once | INTRAVENOUS | Status: AC
  Administered 2018-01-21: 180 mg via INTRAVENOUS
  Filled 2018-01-21: qty 36

## 2018-01-21 MED ORDER — SODIUM CHLORIDE 0.9% FLUSH
10.0000 mL | INTRAVENOUS | Status: AC | PRN
  Filled 2018-01-21: qty 10

## 2018-01-21 MED ORDER — LEUCOVORIN CALCIUM INJECTION 350 MG
400.0000 mg/m2 | Freq: Once | INTRAVENOUS | Status: AC
  Administered 2018-01-21: 844 mg via INTRAVENOUS
  Filled 2018-01-21: qty 42.2

## 2018-01-21 MED ORDER — DIPHENHYDRAMINE HCL 50 MG/ML IJ SOLN
INTRAMUSCULAR | Status: AC
  Filled 2018-01-21: qty 1

## 2018-01-21 MED ORDER — METHYLPREDNISOLONE SODIUM SUCC 125 MG IJ SOLR
125.0000 mg | Freq: Once | INTRAMUSCULAR | Status: AC
  Administered 2018-01-21: 125 mg via INTRAVENOUS

## 2018-01-21 MED ORDER — PALONOSETRON HCL INJECTION 0.25 MG/5ML
0.2500 mg | Freq: Once | INTRAVENOUS | Status: AC
  Administered 2018-01-21: 0.25 mg via INTRAVENOUS

## 2018-01-21 MED ORDER — PALONOSETRON HCL INJECTION 0.25 MG/5ML
INTRAVENOUS | Status: AC
  Filled 2018-01-21: qty 5

## 2018-01-21 MED ORDER — SODIUM CHLORIDE 0.9 % IV SOLN
2365.0000 mg/m2 | INTRAVENOUS | Status: AC
  Administered 2018-01-21: 5000 mg via INTRAVENOUS
  Filled 2018-01-21: qty 100

## 2018-01-21 MED ORDER — DEXAMETHASONE SODIUM PHOSPHATE 10 MG/ML IJ SOLN
INTRAMUSCULAR | Status: AC
  Filled 2018-01-21: qty 1

## 2018-01-21 MED ORDER — DEXAMETHASONE SODIUM PHOSPHATE 10 MG/ML IJ SOLN
10.0000 mg | Freq: Once | INTRAMUSCULAR | Status: AC
  Administered 2018-01-21: 10 mg via INTRAVENOUS

## 2018-01-21 MED ORDER — DEXTROSE 5 % IV SOLN
Freq: Once | INTRAVENOUS | Status: AC
  Administered 2018-01-21: 14:00:00 via INTRAVENOUS

## 2018-01-21 MED ORDER — HEPARIN SOD (PORK) LOCK FLUSH 100 UNIT/ML IV SOLN
500.0000 [IU] | Freq: Once | INTRAVENOUS | Status: AC | PRN
  Filled 2018-01-21: qty 5

## 2018-01-21 MED ORDER — FLUOROURACIL CHEMO INJECTION 2.5 GM/50ML
400.0000 mg/m2 | Freq: Once | INTRAVENOUS | Status: AC
  Administered 2018-01-21: 850 mg via INTRAVENOUS
  Filled 2018-01-21: qty 17

## 2018-01-21 MED ORDER — DIPHENHYDRAMINE HCL 50 MG/ML IJ SOLN
50.0000 mg | Freq: Once | INTRAMUSCULAR | Status: AC
  Administered 2018-01-21: 50 mg via INTRAVENOUS

## 2018-01-21 MED ORDER — FAMOTIDINE IN NACL 20-0.9 MG/50ML-% IV SOLN
20.0000 mg | Freq: Two times a day (BID) | INTRAVENOUS | Status: AC
  Administered 2018-01-21: 20 mg via INTRAVENOUS

## 2018-01-21 NOTE — Progress Notes (Unsigned)
1510  Patient stated that her hands were itching.  She then started to  sweat.   1511 Treatment was paused and a liter of NS was hung running wide  open. [place], PA at chairside 1513 B/P 71/53, HR 122, 97% O2 on 2L of oxygen 1513 50mg  Benadryl given 1513 125mg  Solumedrol given 1514 20mg  Pepcid given 1517 B/P 89/71, HR 116, 99% O2 on 2L of oxygen 1519 B/P 124/109, HR 104, 100% O2 on 2L oxygen 1525 Sandi Mealy went to talk to Dr. Benay Spice about the plan moving forward. 1530 Patient escorted to bathroom.  Patient is stable and sleepy.

## 2018-01-21 NOTE — Patient Instructions (Signed)
[facility] Discharge Instructions for Patients Receiving Chemotherapy  Today you received the following chemotherapy agents Oxaliplatin, Leucovorin and Adrucil   To help prevent nausea and vomiting after your treatment, we encourage you to take your nausea medication as directed. No Zofran for 3 days. Take Compazine instead.     If you develop nausea and vomiting that is not controlled by your nausea medication, call the clinic.   BELOW ARE SYMPTOMS THAT SHOULD BE REPORTED IMMEDIATELY:  *FEVER GREATER THAN 100.5 F  *CHILLS WITH OR WITHOUT FEVER  NAUSEA AND VOMITING THAT IS NOT CONTROLLED WITH YOUR NAUSEA MEDICATION  *UNUSUAL SHORTNESS OF BREATH  *UNUSUAL BRUISING OR BLEEDING  TENDERNESS IN MOUTH AND THROAT WITH OR WITHOUT PRESENCE OF ULCERS  *URINARY PROBLEMS  *BOWEL PROBLEMS  UNUSUAL RASH Items with * indicate a potential emergency and should be followed up as soon as possible.  Feel free to call the clinic should you have any questions or concerns. The clinic phone number is [phone].  Please show the [place] at check-in to the Emergency Department and triage nurse.

## 2018-01-21 NOTE — Progress Notes (Addendum)
  [facility] [facility] OFFICE PROGRESS NOTE   Diagnosis: Colon cancer  INTERVAL HISTORY:   [name] completed another cycle of chemotherapy on 01/08/2018.  Oxaliplatin was held with this cycle secondary to thrombocytopenia.  She did not have cold sensitivity following chemotherapy.  She has no neuropathy symptoms at present.  She sometimes has tingling in the feet at night.  Objective:  Vital signs in last 24 hours:  Blood pressure 138/88, pulse 63, temperature 98.4 F (36.9 C), temperature source Oral, resp. rate 16, height '5\' 3"'$  (1.6 m), weight 213 lb 9.6 oz (96.9 kg), last menstrual period 07/10/2001, SpO2 100 %.    HEENT: No thrush or ulcers Resp: Lungs clear bilaterally Cardio: Regular rate and rhythm GI: No hepatomegaly, nontender Vascular: No leg edema Neuro: Mild loss of vibratory sense at the fingertips bilaterally Skin: Hyperpigmentation of the hands  Portacath/PICC-without erythema  Lab Results:  Lab Results  Component Value Date   WBC 4.5 01/21/2018   HGB 11.7 01/21/2018   HCT 34.6 (L) 01/21/2018   MCV 93.3 01/21/2018   PLT 208 01/21/2018   NEUTROABS 2.3 01/21/2018    CMP     Component Value Date/Time   NA 139 01/08/2018 0949   NA 138 04/04/2017 1009   K 3.3 (L) 01/08/2018 0949   CL 106 01/08/2018 0949   CO2 24 01/08/2018 0949   GLUCOSE 121 01/08/2018 0949   BUN 8 01/08/2018 0949   BUN 13 04/04/2017 1009   CREATININE 0.76 01/08/2018 0949   CREATININE 0.62 03/29/2016 1602   CALCIUM 11.5 (H) 01/08/2018 0949   PROT 6.9 01/08/2018 0949   PROT 6.7 04/04/2017 1009   ALBUMIN 3.6 01/08/2018 0949   ALBUMIN 4.3 04/04/2017 1009   AST 28 01/08/2018 0949   ALT 22 01/08/2018 0949   ALKPHOS 146 01/08/2018 0949   BILITOT 0.4 01/08/2018 0949   GFRNONAA >60 01/08/2018 0949   GFRAA >60 01/08/2018 0949    Lab Results  Component Value Date   CEA1 <1.00 10/21/2017     Medications: I have reviewed the patient's current  medications.   Assessment/Plan: 1. 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 MSI-stable ? 3/14 lymph nodes positive for metastatic carcinoma, lymphovascular invasion present ? Cycle 1 FOLFOX 10/22/2017 ? CTs chest, abdomen, and pelvis 10/28/2017-negative for metastatic disease ? Cycle 2 FOLFOX 11/05/2017 ? Cycle 3 FOLFOX 11/27/2017 ? Cycle 4 FOLFOX 12/10/2017 ? Cycle 5 FOLFOX 12/24/2017 ? Cycle 6 FOLFOX 01/08/2018 (Oxaliplatin held due to thrombocytopenia) ? Cycle 7 FOLFOX 01/21/2018-oxaliplatin resumed  2.Asthma  3.Port-A-Cath placement 10/17/2017  4.History of neutropenia secondary to chemotherapy-resolved  5.   History of thrombocytopenia secondary to chemotherapy  Disposition: Sharon Allison appears well.  The platelet count has recovered.  Oxaliplatin will be added back to the chemotherapy regimen today.  We discussed the rationale for including oxaliplatin and the chemotherapy regimen.  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  This is not interfering with activity.  She received Neulasta following cycle 5.  She tolerated the Neulasta well.  She will receive Neulasta with this cycle of chemotherapy.  [name] will return for an office visit in the next cycle of chemotherapy in 2 weeks.  Her ECOG performance status is measured at 1.  Betsy Coder, MD  01/21/2018  11:49 AM

## 2018-01-21 NOTE — Telephone Encounter (Signed)
Scheduled appt per 5/15 los - Gave patient AVS and calender per los.  

## 2018-01-21 NOTE — Patient Instructions (Signed)
Implanted Port Home Guide An implanted port is a type of central line that is placed under the skin. Central lines are used to provide IV access when treatment or nutrition needs to be given through a person's veins. Implanted ports are used for long-term IV access. An implanted port may be placed because:  You need IV medicine that would be irritating to the small veins in your hands or arms.  You need long-term IV medicines, such as antibiotics.  You need IV nutrition for a long period.  You need frequent blood draws for lab tests.  You need dialysis.  Implanted ports are usually placed in the chest area, but they can also be placed in the upper arm, the abdomen, or the leg. An implanted port has two main parts:  Reservoir. The reservoir is round and will appear as a small, raised area under your skin. The reservoir is the part where a needle is inserted to give medicines or draw blood.  Catheter. The catheter is a thin, flexible tube that extends from the reservoir. The catheter is placed into a large vein. Medicine that is inserted into the reservoir goes into the catheter and then into the vein.  How will I care for my incision site? Do not get the incision site wet. Bathe or shower as directed by your health care provider. How is my port accessed? Special steps must be taken to access the port:  Before the port is accessed, a numbing cream can be placed on the skin. This helps numb the skin over the port site.  Your health care provider uses a sterile technique to access the port. ? Your health care provider must put on a mask and sterile gloves. ? The skin over your port is cleaned carefully with an antiseptic and allowed to dry. ? The port is gently pinched between sterile gloves, and a needle is inserted into the port.  Only "non-coring" port needles should be used to access the port. Once the port is accessed, a blood return should be checked. This helps ensure that the port  is in the vein and is not clogged.  If your port needs to remain accessed for a constant infusion, a clear (transparent) bandage will be placed over the needle site. The bandage and needle will need to be changed every week, or as directed by your health care provider.  Keep the bandage covering the needle clean and dry. Do not get it wet. Follow your health care provider's instructions on how to take a shower or bath while the port is accessed.  If your port does not need to stay accessed, no bandage is needed over the port.  What is flushing? Flushing helps keep the port from getting clogged. Follow your health care provider's instructions on how and when to flush the port. Ports are usually flushed with saline solution or a medicine called heparin. The need for flushing will depend on how the port is used.  If the port is used for intermittent medicines or blood draws, the port will need to be flushed: ? After medicines have been given. ? After blood has been drawn. ? As part of routine maintenance.  If a constant infusion is running, the port may not need to be flushed.  How long will my port stay implanted? The port can stay in for as long as your health care provider thinks it is needed. When it is time for the port to come out, surgery will be   done to remove it. The procedure is similar to the one performed when the port was put in. When should I seek immediate medical care? When you have an implanted port, you should seek immediate medical care if:  You notice a bad smell coming from the incision site.  You have swelling, redness, or drainage at the incision site.  You have more swelling or pain at the port site or the surrounding area.  You have a fever that is not controlled with medicine.  This information is not intended to replace advice given to you by your health care provider. Make sure you discuss any questions you have with your health care provider. Document  Released: 08/26/2005 Document Revised: 02/01/2016 Document Reviewed: 05/03/2013 Elsevier Interactive Patient Education  2017 [employer]  

## 2018-01-22 NOTE — Progress Notes (Signed)
3 MONTH VISIT FOR [id number] STUDY; Met with patient briefly in infusion room for 3 month follow up on above study.  Informed patient she is at the 3 month time point since enrolling on study.  Patient is continuing on chemotherapy for her stage 3 colon cancer with curative intent.  She states she has about 3 more months of treatment planned.  She states she does have side effects such as fatigue from treatment but is able to perform all of her ADLs and instrumental ADLs by herself.  Patient is working part time from home on the weekends.  RN assessed ECOG level as 1 and Dr. Benay Spice agrees.  Patient denies any changes to her insurance since beginning on study.  Informed patient she should be receiving email to complete her next study survey in about one week.  Asked patient to please look for this email and to contact research nurse if any questions or difficulties. She verbalized understanding.  Thanked patient very much for completing her baseline survey and for her time today and ongoing participation in this study.  Foye Spurling, BSN, RN Clinical Research Nurse 01/21/18 1:00 PM

## 2018-01-22 NOTE — Progress Notes (Signed)
   DATE: 01/20/2018     X CHEMO/IMMUNOTHERAPY REACTION          MD: Benay Spice     AGENT/BLOOD PRODUCT RECEIVING TODAY:   FOLFOX  AGENT/BLOOD PRODUCT RECEIVING IMMEDIATELY PRIOR TO REACTION: Oxaliplatin  VS: BP:     71/53  p:       122 SPO2:       97% on 2 L via nasal cannula         BP:     89/71  p:       116 SPO2:       99% on 2 L via nasal cannula         BP:     124/109 p:       104 SPO2:       100% on 2 L via nasal cannula  BP:     124/109 p:       97 SPO2:       100% on 2 L via nasal cannula  REACTION(S): Tachycardia, diaphoresis, hypotension, and itching  PREMEDS: Aloxi and Decadron  INTERVENTION: Benadryl 50 mg IV, Solu-Medrol 125 mg IV, Pepcid 20 mg IV, and oxygen 2 L via nasal cannula  Review of Systems  Constitutional: Positive for diaphoresis.  HENT: Negative for trouble swallowing.   Respiratory: Negative for cough, choking, chest tightness, shortness of breath and wheezing.   Cardiovascular: Positive for palpitations. Negative for chest pain.  Gastrointestinal: Negative for nausea and vomiting.  Musculoskeletal: Negative for back pain.  Skin: Negative for color change and rash.       Itching  Neurological: Negative for dizziness, light-headedness and headaches.    Physical Exam  Constitutional: No distress.  HENT:  Head: Normocephalic and atraumatic.  Cardiovascular: Normal rate, regular rhythm and normal heart sounds. Exam reveals no gallop and no friction rub.  No murmur heard. Pulmonary/Chest: Effort normal and breath sounds normal. No respiratory distress. She has no wheezes. She has no rales.  Neurological: She is alert.  Skin: Skin is warm. No rash noted. She is diaphoretic. No erythema.     OUTCOME: X  Patient responded to intervention.  The remainder of the patient's oxaliplatin infusion was held.  The patient was able to receive 5-FU and leucovorin.   Sandi Mealy, MHS, PA-C  This case was discussed with Dr. Lebron Conners. He expressed his agreement  with my management of this patient.

## 2018-01-23 ENCOUNTER — Inpatient Hospital Stay: Payer: BC Managed Care – PPO

## 2018-01-23 VITALS — BP 136/68 | HR 71 | Temp 98.0°F | Resp 18

## 2018-01-23 DIAGNOSIS — Z5111 Encounter for antineoplastic chemotherapy: Secondary | ICD-10-CM | POA: Diagnosis not present

## 2018-01-23 DIAGNOSIS — C182 Malignant neoplasm of ascending colon: Secondary | ICD-10-CM

## 2018-01-23 MED ORDER — PEGFILGRASTIM-CBQV 6 MG/0.6ML ~~LOC~~ SOSY
6.0000 mg | PREFILLED_SYRINGE | Freq: Once | SUBCUTANEOUS | Status: AC
  Administered 2018-01-23: 6 mg via SUBCUTANEOUS

## 2018-01-23 MED ORDER — HEPARIN SOD (PORK) LOCK FLUSH 100 UNIT/ML IV SOLN
500.0000 [IU] | Freq: Once | INTRAVENOUS | Status: AC | PRN
  Administered 2018-01-23: 500 [IU]
  Filled 2018-01-23: qty 5

## 2018-01-23 MED ORDER — PEGFILGRASTIM-CBQV 6 MG/0.6ML ~~LOC~~ SOSY
PREFILLED_SYRINGE | SUBCUTANEOUS | Status: AC
  Filled 2018-01-23: qty 0.6

## 2018-01-23 MED ORDER — SODIUM CHLORIDE 0.9% FLUSH
10.0000 mL | INTRAVENOUS | Status: DC | PRN
  Administered 2018-01-23: 10 mL
  Filled 2018-01-23: qty 10

## 2018-01-28 ENCOUNTER — Other Ambulatory Visit: Payer: Self-pay

## 2018-02-02 ENCOUNTER — Other Ambulatory Visit: Payer: Self-pay | Admitting: Oncology

## 2018-02-04 ENCOUNTER — Inpatient Hospital Stay (HOSPITAL_BASED_OUTPATIENT_CLINIC_OR_DEPARTMENT_OTHER): Payer: BC Managed Care – PPO | Admitting: Nurse Practitioner

## 2018-02-04 ENCOUNTER — Inpatient Hospital Stay: Payer: BC Managed Care – PPO

## 2018-02-04 ENCOUNTER — Encounter: Payer: Self-pay | Admitting: Nurse Practitioner

## 2018-02-04 VITALS — BP 131/75 | HR 65 | Temp 97.8°F | Resp 18 | Ht 63.0 in | Wt 211.7 lb

## 2018-02-04 DIAGNOSIS — C182 Malignant neoplasm of ascending colon: Secondary | ICD-10-CM

## 2018-02-04 DIAGNOSIS — Z5111 Encounter for antineoplastic chemotherapy: Secondary | ICD-10-CM | POA: Diagnosis not present

## 2018-02-04 DIAGNOSIS — C180 Malignant neoplasm of cecum: Secondary | ICD-10-CM | POA: Diagnosis not present

## 2018-02-04 DIAGNOSIS — D696 Thrombocytopenia, unspecified: Secondary | ICD-10-CM

## 2018-02-04 DIAGNOSIS — Z95828 Presence of other vascular implants and grafts: Secondary | ICD-10-CM

## 2018-02-04 LAB — CBC WITH DIFFERENTIAL (CANCER CENTER ONLY)
BASOS PCT: 0 %
Basophils Absolute: 0 10*3/uL (ref 0.0–0.1)
EOS ABS: 0.1 10*3/uL (ref 0.0–0.5)
EOS PCT: 1 %
HCT: 34.3 % — ABNORMAL LOW (ref 34.8–46.6)
HEMOGLOBIN: 11.4 g/dL — AB (ref 11.6–15.9)
LYMPHS ABS: 2.2 10*3/uL (ref 0.9–3.3)
Lymphocytes Relative: 28 %
MCH: 31.5 pg (ref 25.1–34.0)
MCHC: 33.2 g/dL (ref 31.5–36.0)
MCV: 94.8 fL (ref 79.5–101.0)
Monocytes Absolute: 0.6 10*3/uL (ref 0.1–0.9)
Monocytes Relative: 8 %
Neutro Abs: 4.9 10*3/uL (ref 1.5–6.5)
Neutrophils Relative %: 63 %
PLATELETS: 158 10*3/uL (ref 145–400)
RBC: 3.62 MIL/uL — AB (ref 3.70–5.45)
RDW: 16.5 % — ABNORMAL HIGH (ref 11.2–14.5)
WBC: 7.9 10*3/uL (ref 3.9–10.3)

## 2018-02-04 LAB — CMP (CANCER CENTER ONLY)
ALBUMIN: 3.7 g/dL (ref 3.5–5.0)
ALK PHOS: 157 U/L — AB (ref 40–150)
ALT: 21 U/L (ref 0–55)
AST: 24 U/L (ref 5–34)
Anion gap: 9 (ref 3–11)
BUN: 11 mg/dL (ref 7–26)
CHLORIDE: 107 mmol/L (ref 98–109)
CO2: 23 mmol/L (ref 22–29)
CREATININE: 0.78 mg/dL (ref 0.60–1.10)
Calcium: 12.1 mg/dL — ABNORMAL HIGH (ref 8.4–10.4)
GFR, Estimated: >60 mL/min (ref >60)
GLUCOSE: 98 mg/dL (ref 70–140)
Potassium: 3.8 mmol/L (ref 3.5–5.1)
SODIUM: 139 mmol/L (ref 136–145)
Total Bilirubin: 0.4 mg/dL (ref 0.2–1.2)
Total Protein: 6.6 g/dL (ref 6.4–8.3)

## 2018-02-04 MED ORDER — SODIUM CHLORIDE 0.9 % IV SOLN
INTRAVENOUS | Status: DC
  Administered 2018-02-04: 14:00:00 via INTRAVENOUS

## 2018-02-04 MED ORDER — LEUCOVORIN CALCIUM INJECTION 350 MG
400.0000 mg/m2 | Freq: Once | INTRAMUSCULAR | Status: AC
  Administered 2018-02-04: 844 mg via INTRAVENOUS
  Filled 2018-02-04: qty 42.2

## 2018-02-04 MED ORDER — FLUOROURACIL CHEMO INJECTION 2.5 GM/50ML
400.0000 mg/m2 | Freq: Once | INTRAVENOUS | Status: AC
  Administered 2018-02-04: 850 mg via INTRAVENOUS
  Filled 2018-02-04: qty 17

## 2018-02-04 MED ORDER — SODIUM CHLORIDE 0.9% FLUSH
10.0000 mL | Freq: Once | INTRAVENOUS | Status: AC
  Administered 2018-02-04: 10 mL
  Filled 2018-02-04: qty 10

## 2018-02-04 MED ORDER — SODIUM CHLORIDE 0.9 % IV SOLN
2375.0000 mg/m2 | INTRAVENOUS | Status: DC
  Administered 2018-02-04: 5000 mg via INTRAVENOUS
  Filled 2018-02-04: qty 100

## 2018-02-04 NOTE — Progress Notes (Signed)
  [facility] OFFICE PROGRESS NOTE   Diagnosis: Colon cancer  INTERVAL HISTORY:   Sharon Allison returns as scheduled.  She completed cycle 7 FOLFOX 01/21/2018.  During the Oxaliplatin infusion she developed itching over the palms, tongue with needle sensation, tachycardia, diaphoresis, hypotension.  She also had bilateral "breast pain".  She received Benadryl, Solu-Medrol, Pepcid.  Oxaliplatin was not resumed.  She completed the 5-fluorouracil and leucovorin.  She denies nausea/vomiting.  No mouth sores.  No diarrhea.  No numbness or tingling in the hands or feet.  Objective:  Vital signs in last 24 hours:  Blood pressure 131/75, pulse 65, temperature 97.8 F (36.6 C), temperature source Oral, resp. rate 18, height '5\' 3"'$  (1.6 m), weight 211 lb 11.2 oz (96 kg), last menstrual period 07/10/2001, SpO2 100 %.    HEENT: No thrush or ulcers. Resp: Lungs clear bilaterally. Cardio: Regular rate and rhythm. GI: Abdomen soft and nontender.  No hepatomegaly. Vascular: No leg edema.  Skin: Palms with hyperpigmentation. Port-A-Cath without erythema.  Lab Results:  Lab Results  Component Value Date   WBC 7.9 02/04/2018   HGB 11.4 (L) 02/04/2018   HCT 34.3 (L) 02/04/2018   MCV 94.8 02/04/2018   PLT 158 02/04/2018   NEUTROABS 4.9 02/04/2018    Imaging:  No results found.  Medications: I have reviewed the patient's current medications.  Assessment/Plan: 1. 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 MSI-stable ? 3/14 lymph nodes positive for metastatic carcinoma, lymphovascular invasion present ? Cycle 1 FOLFOX 10/22/2017 ? CTs chest, abdomen, and pelvis 10/28/2017-negative for metastatic disease ? Cycle 2 FOLFOX 11/05/2017 ? Cycle 3 FOLFOX 11/27/2017 ? Cycle 4 FOLFOX 12/10/2017 ? Cycle 5 FOLFOX 12/24/2017 ? Cycle 6 FOLFOX 01/08/2018 (Oxaliplatin held due to thrombocytopenia) ? Cycle 7 FOLFOX 01/21/2018-oxaliplatin  resumed ? Cycle 8 FOLFOX 02/04/2018-Oxaliplatin eliminated due to an allergic reaction with cycle 7  2.Asthma  3.Port-A-Cath placement 10/17/2017  4.History of neutropenia secondary to chemotherapy-resolved  5. History of thrombocytopenia secondary to chemotherapy  6.   Allergic reaction to Oxaliplatin 01/21/2018- pruritus, tachycardia, diaphoresis, hypotension    Disposition: [name] appears stable.  She has completed 7 cycles of FOLFOX.  She had an allergic reaction to Oxaliplatin with cycle 7.  Oxaliplatin has been eliminated from the regimen.  Plan to proceed with cycle 8 systemic therapy today as scheduled.  She will return for lab, follow-up and the next cycle of chemotherapy in 2 weeks.  She will contact the office in the interim with any problems.  Plan reviewed with Dr. Benay Spice.    Ned Card ANP/GNP-BC   02/04/2018  1:16 PM

## 2018-02-04 NOTE — Patient Instructions (Addendum)
[facility] Discharge Instructions for Patients Receiving Chemotherapy  Today you received the following chemotherapy agents  Leucovorin and Adrucil   To help prevent nausea and vomiting after your treatment, we encourage you to take your nausea medication as directed. No Zofran for 3 days. Take Compazine instead.     If you develop nausea and vomiting that is not controlled by your nausea medication, call the clinic.   BELOW ARE SYMPTOMS THAT SHOULD BE REPORTED IMMEDIATELY:  *FEVER GREATER THAN 100.5 F  *CHILLS WITH OR WITHOUT FEVER  NAUSEA AND VOMITING THAT IS NOT CONTROLLED WITH YOUR NAUSEA MEDICATION  *UNUSUAL SHORTNESS OF BREATH  *UNUSUAL BRUISING OR BLEEDING  TENDERNESS IN MOUTH AND THROAT WITH OR WITHOUT PRESENCE OF ULCERS  *URINARY PROBLEMS  *BOWEL PROBLEMS  UNUSUAL RASH Items with * indicate a potential emergency and should be followed up as soon as possible.  Feel free to call the clinic should you have any questions or concerns. The clinic phone number is [phone].  Please show the CHEMO ALERT CARD at check-in to the Emergency Department and triage nurse.   

## 2018-02-05 ENCOUNTER — Ambulatory Visit (INDEPENDENT_AMBULATORY_CARE_PROVIDER_SITE_OTHER): Payer: BC Managed Care – PPO | Admitting: Pulmonary Disease

## 2018-02-05 ENCOUNTER — Encounter: Payer: Self-pay | Admitting: Pulmonary Disease

## 2018-02-05 VITALS — BP 124/80 | HR 60 | Ht 63.0 in | Wt 211.0 lb

## 2018-02-05 DIAGNOSIS — Z9989 Dependence on other enabling machines and devices: Secondary | ICD-10-CM

## 2018-02-05 DIAGNOSIS — G4733 Obstructive sleep apnea (adult) (pediatric): Secondary | ICD-10-CM

## 2018-02-05 NOTE — Patient Instructions (Signed)
Will have [facility] arrange for CPAP mask refitting  Follow up in 1 year

## 2018-02-05 NOTE — Progress Notes (Signed)
[facility]  Chief Complaint  Patient presents with  . Follow-up    Pt is having hard time with her mask, it keeps leaking each night. Pt requesting new mask fitting.     Vital signs: BP 124/80 (BP Location: Left Arm, Cuff Size: Normal)   Pulse 60   Ht 5\' 3"  (1.6 m)   Wt 211 lb (95.7 kg)   LMP 07/10/2001 (LMP Unknown)   SpO2 100%   BMI 37.38 kg/m   History of Present Illness: Sharon Allison is a 62 y.o. female with obstructive sleep apnea.  Since her last visit she was started on CPAP.  She is sleeping much better.  Only issue is with mask fit.  She has full face mask. She can fall asleep, but then mask starts leaking after couple hours and makes noises.  This wakes her up.  She thought she only needed to use CPAP 4 hours per night, and that is what she was doing.  She feels using CPAP more would help.   Physical Exam:  General - pleasant Eyes - pupils reactive ENT - no sinus tenderness, no oral exudate, no LAN, MP 2, low laying soft palate Cardiac - regular, no murmur Chest - no wheeze, rales Abd - soft, non tender Ext - no edema Skin - no rashes Neuro - normal strength Psych - normal mood   Assessment/Plan:  Obstructive sleep apnea. - she is compliant with CPAP and reports benefit - continue auto CPAP - explained importance of using CPAP whenever she is asleep - will arrange for CPAP mask refitting with her DME   Patient Instructions  Will have [facility] arrange for CPAP mask refitting  Follow up in 1 year    Chesley Mires, MD [place] 02/05/2018, 12:27 PM Pager:  [phone]  Flow Sheet  Sleep tests: PSG 11/06/17 >> AHI 95.8, SpO2 low 71% Auto CPAP 01/06/18 to 02/04/18 >> used on 29 of 30 nights with average 4 hrs 23 min.  Average AHI 5.7 with median CPAP 8 and 95 th percentile CPAP 12 cm H2O  Past Medical History: She  has a past medical history of Anemia, Asthma, Colon polyp,  Fibroid, GERD (gastroesophageal reflux disease), Heart murmur, Hypertension, and Sleep apnea.  Past Surgical History: She  has a past surgical history that includes Myomectomy (DONE 5 YEARS PRIOR TO HYSTERECTOMY); Abdominal hysterectomy; Laparoscopic right hemi colectomy (Right, 09/18/2017); Colonoscopy (N/A, 09/18/2017); and Portacath placement (N/A, 10/17/2017).  Family History: Her family history includes Cancer in her maternal grandfather; Hodgkin's lymphoma in her maternal grandmother; Hypertension in her mother; Kidney disease in her brother; Leukemia in her brother, mother, and sister; Lupus in her father.  Social History: She  reports that she has never smoked. She has never used smokeless tobacco. She reports that she drinks alcohol. She reports that she does not use drugs.  Medications: Allergies as of 02/05/2018      Reactions   Oxaliplatin    Iohexol Swelling   Patient had swelling and itching to face in 1980's. Had 13 hour pre meds before scan 10/27/17   Iodine Swelling   Sulfa Antibiotics Itching      Medication List        Accurate as of 02/05/18 12:27 PM. Always use your most recent med list.          albuterol 108 (90 Base) MCG/ACT inhaler Commonly known as:  PROVENTIL HFA;VENTOLIN HFA Inhale into the lungs.  albuterol 108 (90 Base) MCG/ACT inhaler Commonly known as:  PROVENTIL HFA;VENTOLIN HFA Inhale 2 puffs into the lungs every 4 (four) hours as needed for wheezing or shortness of breath.   CENTRUM SILVER PO Take 1 tablet by mouth daily.   DYMISTA 137-50 MCG/ACT Susp Generic drug:  Azelastine-Fluticasone Place 1 spray into the nose daily as needed (congestion).   ketotifen 0.025 % ophthalmic solution Commonly known as:  ZADITOR Place 1 drop into both eyes daily as needed (allergies).   lansoprazole 15 MG capsule Commonly known as:  PREVACID Take 15 mg by mouth daily as needed (acid reflux).   lidocaine-prilocaine cream Commonly known as:  EMLA Apply  to portacath 1-2 hours prior to use   montelukast 10 MG tablet Commonly known as:  SINGULAIR TAKE 1 TABLET BY MOUTH ONCE DAILY AT BEDTIME   potassium chloride SA 20 MEQ tablet Commonly known as:  K-DUR,KLOR-CON Take 1 tablet (20 mEq total) by mouth daily.   SYMBICORT 160-4.5 MCG/ACT inhaler Generic drug:  budesonide-formoterol INHALE 2 PUFFS BY MOUTH TWICE DAILY   SYSTANE OP Place 1 drop into both eyes daily as needed (dry eyes).   vitamin B-12 250 MCG tablet Commonly known as:  CYANOCOBALAMIN Take 250 mcg by mouth daily.   vitamin C 1000 MG tablet Take 1,000 mg by mouth daily.   Vitamin D3 1000 units Caps Take 2,000 Units by mouth daily.

## 2018-02-06 ENCOUNTER — Inpatient Hospital Stay: Payer: BC Managed Care – PPO

## 2018-02-06 VITALS — BP 140/82 | HR 58 | Temp 98.2°F | Resp 16

## 2018-02-06 DIAGNOSIS — Z5111 Encounter for antineoplastic chemotherapy: Secondary | ICD-10-CM | POA: Diagnosis not present

## 2018-02-06 DIAGNOSIS — C182 Malignant neoplasm of ascending colon: Secondary | ICD-10-CM

## 2018-02-06 MED ORDER — PEGFILGRASTIM-CBQV 6 MG/0.6ML ~~LOC~~ SOSY
PREFILLED_SYRINGE | SUBCUTANEOUS | Status: AC
  Filled 2018-02-06: qty 0.6

## 2018-02-06 MED ORDER — HEPARIN SOD (PORK) LOCK FLUSH 100 UNIT/ML IV SOLN
500.0000 [IU] | Freq: Once | INTRAVENOUS | Status: AC | PRN
  Administered 2018-02-06: 500 [IU]
  Filled 2018-02-06: qty 5

## 2018-02-06 MED ORDER — SODIUM CHLORIDE 0.9% FLUSH
10.0000 mL | INTRAVENOUS | Status: DC | PRN
  Administered 2018-02-06: 10 mL
  Filled 2018-02-06: qty 10

## 2018-02-06 NOTE — Patient Instructions (Signed)
Implanted Port Home Guide An implanted port is a type of central line that is placed under the skin. Central lines are used to provide IV access when treatment or nutrition needs to be given through a person's veins. Implanted ports are used for long-term IV access. An implanted port may be placed because:  You need IV medicine that would be irritating to the small veins in your hands or arms.  You need long-term IV medicines, such as antibiotics.  You need IV nutrition for a long period.  You need frequent blood draws for lab tests.  You need dialysis.  Implanted ports are usually placed in the chest area, but they can also be placed in the upper arm, the abdomen, or the leg. An implanted port has two main parts:  Reservoir. The reservoir is round and will appear as a small, raised area under your skin. The reservoir is the part where a needle is inserted to give medicines or draw blood.  Catheter. The catheter is a thin, flexible tube that extends from the reservoir. The catheter is placed into a large vein. Medicine that is inserted into the reservoir goes into the catheter and then into the vein.  How will I care for my incision site? Do not get the incision site wet. Bathe or shower as directed by your health care provider. How is my port accessed? Special steps must be taken to access the port:  Before the port is accessed, a numbing cream can be placed on the skin. This helps numb the skin over the port site.  Your health care provider uses a sterile technique to access the port. ? Your health care provider must put on a mask and sterile gloves. ? The skin over your port is cleaned carefully with an antiseptic and allowed to dry. ? The port is gently pinched between sterile gloves, and a needle is inserted into the port.  Only "non-coring" port needles should be used to access the port. Once the port is accessed, a blood return should be checked. This helps ensure that the port  is in the vein and is not clogged.  If your port needs to remain accessed for a constant infusion, a clear (transparent) bandage will be placed over the needle site. The bandage and needle will need to be changed every week, or as directed by your health care provider.  Keep the bandage covering the needle clean and dry. Do not get it wet. Follow your health care provider's instructions on how to take a shower or bath while the port is accessed.  If your port does not need to stay accessed, no bandage is needed over the port.  What is flushing? Flushing helps keep the port from getting clogged. Follow your health care provider's instructions on how and when to flush the port. Ports are usually flushed with saline solution or a medicine called heparin. The need for flushing will depend on how the port is used.  If the port is used for intermittent medicines or blood draws, the port will need to be flushed: ? After medicines have been given. ? After blood has been drawn. ? As part of routine maintenance.  If a constant infusion is running, the port may not need to be flushed.  How long will my port stay implanted? The port can stay in for as long as your health care provider thinks it is needed. When it is time for the port to come out, surgery will be   done to remove it. The procedure is similar to the one performed when the port was put in. When should I seek immediate medical care? When you have an implanted port, you should seek immediate medical care if:  You notice a bad smell coming from the incision site.  You have swelling, redness, or drainage at the incision site.  You have more swelling or pain at the port site or the surrounding area.  You have a fever that is not controlled with medicine.  This information is not intended to replace advice given to you by your health care provider. Make sure you discuss any questions you have with your health care provider. Document  Released: 08/26/2005 Document Revised: 02/01/2016 Document Reviewed: 05/03/2013 Elsevier Interactive Patient Education  2017 [employer]  

## 2018-02-15 ENCOUNTER — Other Ambulatory Visit: Payer: Self-pay | Admitting: Oncology

## 2018-02-18 ENCOUNTER — Inpatient Hospital Stay (HOSPITAL_BASED_OUTPATIENT_CLINIC_OR_DEPARTMENT_OTHER): Payer: BC Managed Care – PPO | Admitting: Nurse Practitioner

## 2018-02-18 ENCOUNTER — Inpatient Hospital Stay: Payer: BC Managed Care – PPO

## 2018-02-18 ENCOUNTER — Inpatient Hospital Stay: Payer: BC Managed Care – PPO | Attending: Oncology

## 2018-02-18 ENCOUNTER — Encounter: Payer: Self-pay | Admitting: Nurse Practitioner

## 2018-02-18 ENCOUNTER — Telehealth: Payer: Self-pay | Admitting: Nurse Practitioner

## 2018-02-18 VITALS — BP 152/75 | HR 66 | Temp 98.3°F | Resp 18 | Ht 63.0 in | Wt 201.6 lb

## 2018-02-18 DIAGNOSIS — Z5111 Encounter for antineoplastic chemotherapy: Secondary | ICD-10-CM | POA: Diagnosis present

## 2018-02-18 DIAGNOSIS — R634 Abnormal weight loss: Secondary | ICD-10-CM

## 2018-02-18 DIAGNOSIS — E210 Primary hyperparathyroidism: Secondary | ICD-10-CM | POA: Insufficient documentation

## 2018-02-18 DIAGNOSIS — C182 Malignant neoplasm of ascending colon: Secondary | ICD-10-CM | POA: Diagnosis not present

## 2018-02-18 DIAGNOSIS — E876 Hypokalemia: Secondary | ICD-10-CM | POA: Diagnosis not present

## 2018-02-18 DIAGNOSIS — Z95828 Presence of other vascular implants and grafts: Secondary | ICD-10-CM

## 2018-02-18 DIAGNOSIS — R197 Diarrhea, unspecified: Secondary | ICD-10-CM

## 2018-02-18 DIAGNOSIS — E46 Unspecified protein-calorie malnutrition: Secondary | ICD-10-CM

## 2018-02-18 DIAGNOSIS — R42 Dizziness and giddiness: Secondary | ICD-10-CM | POA: Diagnosis not present

## 2018-02-18 DIAGNOSIS — K521 Toxic gastroenteritis and colitis: Secondary | ICD-10-CM | POA: Diagnosis not present

## 2018-02-18 LAB — CBC WITH DIFFERENTIAL (CANCER CENTER ONLY)
BASOS ABS: 0 10*3/uL (ref 0.0–0.1)
BASOS PCT: 0 %
Eosinophils Absolute: 0.1 10*3/uL (ref 0.0–0.5)
Eosinophils Relative: 1 %
HEMATOCRIT: 34.5 % — AB (ref 34.8–46.6)
HEMOGLOBIN: 11.8 g/dL (ref 11.6–15.9)
LYMPHS PCT: 24 %
Lymphs Abs: 1.2 10*3/uL (ref 0.9–3.3)
MCH: 32.7 pg (ref 25.1–34.0)
MCHC: 34.2 g/dL (ref 31.5–36.0)
MCV: 95.7 fL (ref 79.5–101.0)
Monocytes Absolute: 0.7 10*3/uL (ref 0.1–0.9)
Monocytes Relative: 15 %
NEUTROS ABS: 2.9 10*3/uL (ref 1.5–6.5)
NEUTROS PCT: 60 %
Platelet Count: 218 10*3/uL (ref 145–400)
RBC: 3.6 MIL/uL — ABNORMAL LOW (ref 3.70–5.45)
RDW: 17.3 % — ABNORMAL HIGH (ref 11.2–14.5)
WBC Count: 4.8 10*3/uL (ref 3.9–10.3)

## 2018-02-18 LAB — CMP (CANCER CENTER ONLY)
ALBUMIN: 4.1 g/dL (ref 3.5–5.0)
ALK PHOS: 142 U/L (ref 40–150)
ALT: 31 U/L (ref 0–55)
AST: 42 U/L — AB (ref 5–34)
Anion gap: 9 (ref 3–11)
BILIRUBIN TOTAL: 1 mg/dL (ref 0.2–1.2)
BUN: 6 mg/dL — AB (ref 7–26)
CALCIUM: 13.7 mg/dL — AB (ref 8.4–10.4)
CO2: 25 mmol/L (ref 22–29)
CREATININE: 0.76 mg/dL (ref 0.60–1.10)
Chloride: 105 mmol/L (ref 98–109)
GFR, Est AFR Am: >60 mL/min (ref >60)
GFR, Estimated: >60 mL/min (ref >60)
GLUCOSE: 96 mg/dL (ref 70–140)
Potassium: 3.3 mmol/L — ABNORMAL LOW (ref 3.5–5.1)
Sodium: 139 mmol/L (ref 136–145)
Total Protein: 6.9 g/dL (ref 6.4–8.3)

## 2018-02-18 MED ORDER — POTASSIUM CHLORIDE CRYS ER 20 MEQ PO TBCR: EXTENDED_RELEASE_TABLET | ORAL | 1 refills | Status: DC

## 2018-02-18 MED ORDER — SODIUM CHLORIDE 0.9% FLUSH
10.0000 mL | Freq: Once | INTRAVENOUS | Status: AC
  Administered 2018-02-18: 10 mL
  Filled 2018-02-18: qty 10

## 2018-02-18 MED ORDER — SODIUM CHLORIDE 0.9 % IV SOLN
Freq: Once | INTRAVENOUS | Status: AC
  Administered 2018-02-18: 13:00:00 via INTRAVENOUS

## 2018-02-18 MED ORDER — SODIUM CHLORIDE 0.9 % IV SOLN
Freq: Once | INTRAVENOUS | Status: AC
  Administered 2018-02-18: 13:00:00 via INTRAVENOUS
  Filled 2018-02-18: qty 1000

## 2018-02-18 MED ORDER — HEPARIN SOD (PORK) LOCK FLUSH 100 UNIT/ML IV SOLN
500.0000 [IU] | Freq: Once | INTRAVENOUS | Status: AC
  Administered 2018-02-18: 500 [IU]
  Filled 2018-02-18: qty 5

## 2018-02-18 NOTE — Progress Notes (Signed)
[facility] OFFICE PROGRESS NOTE   Diagnosis: Colon cancer  INTERVAL HISTORY:   Ms. Causey returns as scheduled.  She completed cycle 8 FOLFOX 02/04/2018.  Oxaliplatin was eliminated from the regimen beginning with cycle 8 due to an allergic reaction with the previous cycle.  She denies nausea/vomiting.  No mouth sores.  She reports multiple loose stools over the past 1 to 2 days.  She estimates at least 6 loose stools in the past 24 hours.  She has not tried Lomotil or Imodium.  She denies fever.  No abdominal pain.  She reports a 10 pound weight loss since she was here last.  She attributes this to an alteration in taste.  Fluid intake has been poor as well.  Objective:  Vital signs in last 24 hours:  Blood pressure (!) 152/75, pulse 66, temperature 98.3 F (36.8 C), temperature source Oral, resp. rate 18, height 5' 3" (1.6 m), weight 201 lb 9.6 oz (91.4 kg), last menstrual period 07/10/2001, SpO2 99 %.    HEENT: Mild white coating over tongue.  No buccal thrush. Resp: Lungs clear bilaterally. Cardio: Regular rate and rhythm. GI: Abdomen soft and nontender.  No hepatomegaly. Vascular: No leg edema.  Skin: Palms without erythema. Port-A-Cath without erythema.   Lab Results:  Lab Results  Component Value Date   WBC 4.8 02/18/2018   HGB 11.8 02/18/2018   HCT 34.5 (L) 02/18/2018   MCV 95.7 02/18/2018   PLT 218 02/18/2018   NEUTROABS 2.9 02/18/2018    Imaging:  No results found.  Medications: I have reviewed the patient's current medications.  Assessment/Plan: 1. 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 MSI-stable ? 3/14 lymph nodes positive for metastatic carcinoma, lymphovascular invasion present ? Cycle 1 FOLFOX 10/22/2017 ? CTs chest, abdomen, and pelvis 10/28/2017-negative for metastatic disease ? Cycle 2 FOLFOX 11/05/2017 ? Cycle 3 FOLFOX 11/27/2017 ? Cycle 4 FOLFOX 12/10/2017 ? Cycle 5 FOLFOX  12/24/2017 ? Cycle 6 FOLFOX 01/08/2018 (Oxaliplatin held due to thrombocytopenia) ? Cycle 7 FOLFOX 01/21/2018-oxaliplatin resumed ? Cycle 8 FOLFOX 02/04/2018-Oxaliplatin eliminated due to an allergic reaction with cycle 7  2.Asthma  3.Port-A-Cath placement 10/17/2017  4.History of neutropenia secondary to chemotherapy-resolved  5.History of thrombocytopenia secondary to chemotherapy  6.   Allergic reaction to Oxaliplatin 01/21/2018- pruritus, tachycardia, diaphoresis, hypotension     Disposition: Ms. Sabo appears stable.  She has completed 8 cycles of FOLFOX.  Oxaliplatin has been eliminated from the regimen due to an allergic reaction with cycle 7.  She reports recent onset of diarrhea.  She has lost 10 pounds over the past 2 weeks.  We decided to hold today's treatment.  She will receive IV fluids today.    She has hypokalemia likely related to the diarrhea.  She will receive 20 mEq of potassium with the IV fluids.  She will  increase oral Kdur 20 mEq from daily to twice daily first dose this evening.  She will take Imodium as needed for the diarrhea.  She has chronic hypercalcemia and was undergoing evaluation of hyperparathyroidism prior to the cancer diagnosis.  Calcium is further elevated today.  We will repeat a chemistry panel on 02/20/2018.  She will return for lab and follow-up on 02/20/2018.  She will contact the office in the interim with any problems.  Plan reviewed with Dr. Benay Spice.  25 minutes were spent face-to-face at today's visit with the majority of that time involved in counseling/coordination of care.  Ned Card ANP/GNP-BC   02/18/2018  11:52 AM

## 2018-02-18 NOTE — Telephone Encounter (Signed)
Scheduled appt pe r6/12 los - unable to schedule IVF due to cap - logged - will contact patient when scheduled.

## 2018-02-18 NOTE — Addendum Note (Signed)
Addended by: Owens Shark on: 02/18/2018 12:28 PM   Modules accepted: Orders

## 2018-02-18 NOTE — Patient Instructions (Addendum)
Dehydration, Adult Dehydration is when there is not enough fluid or water in your body. This happens when you lose more fluids than you take in. Dehydration can range from mild to very bad. It should be treated right away to keep it from getting very bad. Symptoms of mild dehydration may include:  Thirst.  Dry lips.  Slightly dry mouth.  Dry, warm skin.  Dizziness. Symptoms of moderate dehydration may include:  Very dry mouth.  Muscle cramps.  Dark pee (urine). Pee may be the color of tea.  Your body making less pee.  Your eyes making fewer tears.  Heartbeat that is uneven or faster than normal (palpitations).  Headache.  Light-headedness, especially when you stand up from sitting.  Fainting (syncope). Symptoms of very bad dehydration may include:  Changes in skin, such as: ? Cold and clammy skin. ? Blotchy (mottled) or pale skin. ? Skin that does not quickly return to normal after being lightly pinched and let go (poor skin turgor).  Changes in body fluids, such as: ? Feeling very thirsty. ? Your eyes making fewer tears. ? Not sweating when body temperature is high, such as in hot weather. ? Your body making very little pee.  Changes in vital signs, such as: ? Weak pulse. ? Pulse that is more than 100 beats a minute when you are sitting still. ? Fast breathing. ? Low blood pressure.  Other changes, such as: ? Sunken eyes. ? Cold hands and feet. ? Confusion. ? Lack of energy (lethargy). ? Trouble waking up from sleep. ? Short-term weight loss. ? Unconsciousness. Follow these instructions at home:  If told by your doctor, drink an ORS: ? Make an ORS by using instructions on the package. ? Start by drinking small amounts, about  cup (120 mL) every 5-10 minutes. ? Slowly drink more until you have had the amount that your doctor said to have.  Drink enough clear fluid to keep your pee clear or pale yellow. If you were told to drink an ORS, finish the ORS  first, then start slowly drinking clear fluids. Drink fluids such as: ? Water. Do not drink only water by itself. Doing that can make the salt (sodium) level in your body get too low (hyponatremia). ? Ice chips. ? Fruit juice that you have added water to (diluted). ? Low-calorie sports drinks.  Avoid: ? Alcohol. ? Drinks that have a lot of sugar. These include high-calorie sports drinks, fruit juice that does not have water added, and soda. ? Caffeine. ? Foods that are greasy or have a lot of fat or sugar.  Take over-the-counter and prescription medicines only as told by your doctor.  Do not take salt tablets. Doing that can make the salt level in your body get too high (hypernatremia).  Eat foods that have minerals (electrolytes). Examples include bananas, oranges, potatoes, tomatoes, and spinach.  Keep all follow-up visits as told by your doctor. This is important. Contact a doctor if:  You have belly (abdominal) pain that: ? Gets worse. ? Stays in one area (localizes).  You have a rash.  You have a stiff neck.  You get angry or annoyed more easily than normal (irritability).  You are more sleepy than normal.  You have a harder time waking up than normal.  You feel: ? Weak. ? Dizzy. ? Very thirsty.  You have peed (urinated) only a small amount of very dark pee during 6-8 hours. Get help right away if:  You have symptoms of   very bad dehydration.  You cannot drink fluids without throwing up (vomiting).  Your symptoms get worse with treatment.  You have a fever.  You have a very bad headache.  You are throwing up or having watery poop (diarrhea) and it: ? Gets worse. ? Does not go away.  You have blood or something green (bile) in your throw-up.  You have blood in your poop (stool). This may cause poop to look black and tarry.  You have not peed in 6-8 hours.  You pass out (faint).  Your heart rate when you are sitting still is more than 100 beats a  minute.  You have trouble breathing. This information is not intended to replace advice given to you by your health care provider. Make sure you discuss any questions you have with your health care provider. Document Released: 06/22/2009 Document Revised: 03/15/2016 Document Reviewed: 10/20/2015 Elsevier Interactive Patient Education  2018 Elsevier Inc.   Hypokalemia Hypokalemia means that the amount of potassium in the blood is lower than normal.Potassium is a chemical that helps regulate the amount of fluid in the body (electrolyte). It also stimulates muscle tightening (contraction) and helps nerves work properly.Normally, most of the body's potassium is inside of cells, and only a very small amount is in the blood. Because the amount in the blood is so small, minor changes to potassium levels in the blood can be life-threatening. What are the causes? This condition may be caused by:  Antibiotic medicine.  Diarrhea or vomiting. Taking too much of a medicine that helps you have a bowel movement (laxative) can cause diarrhea and lead to hypokalemia.  Chronic kidney disease (CKD).  Medicines that help the body get rid of excess fluid (diuretics).  Eating disorders, such as bulimia.  Low magnesium levels in the body.  Sweating a lot.  What are the signs or symptoms? Symptoms of this condition include:  Weakness.  Constipation.  Fatigue.  Muscle cramps.  Mental confusion.  Skipped heartbeats or irregular heartbeat (palpitations).  Tingling or numbness.  How is this diagnosed? This condition is diagnosed with a blood test. How is this treated? Hypokalemia can be treated by taking potassium supplements by mouth or adjusting the medicines that you take. Treatment may also include eating more foods that contain a lot of potassium. If your potassium level is very low, you may need to get potassium through an IV tube in one of your veins and be monitored in the  hospital. Follow these instructions at home:  Take over-the-counter and prescription medicines only as told by your health care provider. This includes vitamins and supplements.  Eat a healthy diet. A healthy diet includes fresh fruits and vegetables, whole grains, healthy fats, and lean proteins.  If instructed, eat more foods that contain a lot of potassium, such as: ? Nuts, such as peanuts and pistachios. ? Seeds, such as sunflower seeds and pumpkin seeds. ? Peas, lentils, and lima beans. ? Whole grain and bran cereals and breads. ? Fresh fruits and vegetables, such as apricots, avocado, bananas, cantaloupe, kiwi, oranges, tomatoes, asparagus, and potatoes. ? Orange juice. ? Tomato juice. ? Red meats. ? Yogurt.  Keep all follow-up visits as told by your health care provider. This is important. Contact a health care provider if:  You have weakness that gets worse.  You feel your heart pounding or racing.  You vomit.  You have diarrhea.  You have diabetes (diabetes mellitus) and you have trouble keeping your blood sugar (glucose) in your   target range. Get help right away if:  You have chest pain.  You have shortness of breath.  You have vomiting or diarrhea that lasts for more than 2 days.  You faint. This information is not intended to replace advice given to you by your health care provider. Make sure you discuss any questions you have with your health care provider. Document Released: 08/26/2005 Document Revised: 04/13/2016 Document Reviewed: 04/13/2016 Elsevier Interactive Patient Education  2018 Elsevier Inc.   

## 2018-02-20 ENCOUNTER — Inpatient Hospital Stay: Payer: BC Managed Care – PPO

## 2018-02-20 ENCOUNTER — Encounter: Payer: Self-pay | Admitting: Nurse Practitioner

## 2018-02-20 ENCOUNTER — Inpatient Hospital Stay (HOSPITAL_BASED_OUTPATIENT_CLINIC_OR_DEPARTMENT_OTHER): Payer: BC Managed Care – PPO | Admitting: Nurse Practitioner

## 2018-02-20 VITALS — BP 141/67 | HR 64 | Temp 97.6°F | Resp 18 | Ht 63.0 in | Wt 205.6 lb

## 2018-02-20 VITALS — BP 131/73 | HR 61 | Temp 98.2°F | Resp 18

## 2018-02-20 DIAGNOSIS — C182 Malignant neoplasm of ascending colon: Secondary | ICD-10-CM

## 2018-02-20 DIAGNOSIS — K521 Toxic gastroenteritis and colitis: Secondary | ICD-10-CM

## 2018-02-20 DIAGNOSIS — E210 Primary hyperparathyroidism: Secondary | ICD-10-CM | POA: Diagnosis not present

## 2018-02-20 DIAGNOSIS — R42 Dizziness and giddiness: Secondary | ICD-10-CM

## 2018-02-20 DIAGNOSIS — Z95828 Presence of other vascular implants and grafts: Secondary | ICD-10-CM

## 2018-02-20 LAB — CMP (CANCER CENTER ONLY)
ALT: 34 U/L (ref 0–55)
AST: 37 U/L — ABNORMAL HIGH (ref 5–34)
Albumin: 3.7 g/dL (ref 3.5–5.0)
Alkaline Phosphatase: 134 U/L (ref 40–150)
Anion gap: 9 (ref 3–11)
BILIRUBIN TOTAL: 0.5 mg/dL (ref 0.2–1.2)
BUN: 5 mg/dL — ABNORMAL LOW (ref 7–26)
CO2: 24 mmol/L (ref 22–29)
CREATININE: 0.73 mg/dL (ref 0.60–1.10)
Calcium: 12.3 mg/dL — ABNORMAL HIGH (ref 8.4–10.4)
Chloride: 106 mmol/L (ref 98–109)
Glucose, Bld: 98 mg/dL (ref 70–140)
POTASSIUM: 3.8 mmol/L (ref 3.5–5.1)
Sodium: 139 mmol/L (ref 136–145)
TOTAL PROTEIN: 6.1 g/dL — AB (ref 6.4–8.3)

## 2018-02-20 LAB — CBC WITH DIFFERENTIAL (CANCER CENTER ONLY)
BASOS ABS: 0 10*3/uL (ref 0.0–0.1)
Basophils Relative: 1 %
EOS ABS: 0.1 10*3/uL (ref 0.0–0.5)
EOS PCT: 2 %
HCT: 33.3 % — ABNORMAL LOW (ref 34.8–46.6)
HEMOGLOBIN: 11 g/dL — AB (ref 11.6–15.9)
LYMPHS PCT: 43 %
Lymphs Abs: 1.4 10*3/uL (ref 0.9–3.3)
MCH: 32.1 pg (ref 25.1–34.0)
MCHC: 33.2 g/dL (ref 31.5–36.0)
MCV: 96.7 fL (ref 79.5–101.0)
Monocytes Absolute: 0.6 10*3/uL (ref 0.1–0.9)
Monocytes Relative: 18 %
NEUTROS PCT: 36 %
Neutro Abs: 1.2 10*3/uL — ABNORMAL LOW (ref 1.5–6.5)
PLATELETS: 205 10*3/uL (ref 145–400)
RBC: 3.44 MIL/uL — AB (ref 3.70–5.45)
RDW: 17.4 % — ABNORMAL HIGH (ref 11.2–14.5)
WBC: 3.3 10*3/uL — AB (ref 3.9–10.3)

## 2018-02-20 MED ORDER — SODIUM CHLORIDE 0.9 % IV SOLN
INTRAVENOUS | Status: AC
  Administered 2018-02-20: 13:00:00 via INTRAVENOUS

## 2018-02-20 MED ORDER — HEPARIN SOD (PORK) LOCK FLUSH 100 UNIT/ML IV SOLN
500.0000 [IU] | Freq: Once | INTRAVENOUS | Status: AC
  Administered 2018-02-20: 500 [IU]
  Filled 2018-02-20: qty 5

## 2018-02-20 MED ORDER — SODIUM CHLORIDE 0.9% FLUSH
10.0000 mL | Freq: Once | INTRAVENOUS | Status: AC
  Administered 2018-02-20: 10 mL
  Filled 2018-02-20: qty 10

## 2018-02-20 NOTE — Patient Instructions (Signed)
Dehydration, Adult Dehydration is when there is not enough fluid or water in your body. This happens when you lose more fluids than you take in. Dehydration can range from mild to very bad. It should be treated right away to keep it from getting very bad. Symptoms of mild dehydration may include:  Thirst.  Dry lips.  Slightly dry mouth.  Dry, warm skin.  Dizziness. Symptoms of moderate dehydration may include:  Very dry mouth.  Muscle cramps.  Dark pee (urine). Pee may be the color of tea.  Your body making less pee.  Your eyes making fewer tears.  Heartbeat that is uneven or faster than normal (palpitations).  Headache.  Light-headedness, especially when you stand up from sitting.  Fainting (syncope). Symptoms of very bad dehydration may include:  Changes in skin, such as: ? Cold and clammy skin. ? Blotchy (mottled) or pale skin. ? Skin that does not quickly return to normal after being lightly pinched and let go (poor skin turgor).  Changes in body fluids, such as: ? Feeling very thirsty. ? Your eyes making fewer tears. ? Not sweating when body temperature is high, such as in hot weather. ? Your body making very little pee.  Changes in vital signs, such as: ? Weak pulse. ? Pulse that is more than 100 beats a minute when you are sitting still. ? Fast breathing. ? Low blood pressure.  Other changes, such as: ? Sunken eyes. ? Cold hands and feet. ? Confusion. ? Lack of energy (lethargy). ? Trouble waking up from sleep. ? Short-term weight loss. ? Unconsciousness. Follow these instructions at home:  If told by your doctor, drink an ORS: ? Make an ORS by using instructions on the package. ? Start by drinking small amounts, about  cup (120 mL) every 5-10 minutes. ? Slowly drink more until you have had the amount that your doctor said to have.  Drink enough clear fluid to keep your pee clear or pale yellow. If you were told to drink an ORS, finish the ORS  first, then start slowly drinking clear fluids. Drink fluids such as: ? Water. Do not drink only water by itself. Doing that can make the salt (sodium) level in your body get too low (hyponatremia). ? Ice chips. ? Fruit juice that you have added water to (diluted). ? Low-calorie sports drinks.  Avoid: ? Alcohol. ? Drinks that have a lot of sugar. These include high-calorie sports drinks, fruit juice that does not have water added, and soda. ? Caffeine. ? Foods that are greasy or have a lot of fat or sugar.  Take over-the-counter and prescription medicines only as told by your doctor.  Do not take salt tablets. Doing that can make the salt level in your body get too high (hypernatremia).  Eat foods that have minerals (electrolytes). Examples include bananas, oranges, potatoes, tomatoes, and spinach.  Keep all follow-up visits as told by your doctor. This is important. Contact a doctor if:  You have belly (abdominal) pain that: ? Gets worse. ? Stays in one area (localizes).  You have a rash.  You have a stiff neck.  You get angry or annoyed more easily than normal (irritability).  You are more sleepy than normal.  You have a harder time waking up than normal.  You feel: ? Weak. ? Dizzy. ? Very thirsty.  You have peed (urinated) only a small amount of very dark pee during 6-8 hours. Get help right away if:  You have symptoms of   very bad dehydration.  You cannot drink fluids without throwing up (vomiting).  Your symptoms get worse with treatment.  You have a fever.  You have a very bad headache.  You are throwing up or having watery poop (diarrhea) and it: ? Gets worse. ? Does not go away.  You have blood or something green (bile) in your throw-up.  You have blood in your poop (stool). This may cause poop to look black and tarry.  You have not peed in 6-8 hours.  You pass out (faint).  Your heart rate when you are sitting still is more than 100 beats a  minute.  You have trouble breathing. This information is not intended to replace advice given to you by your health care provider. Make sure you discuss any questions you have with your health care provider. Document Released: 06/22/2009 Document Revised: 03/15/2016 Document Reviewed: 10/20/2015 Elsevier Interactive Patient Education  2018 Elsevier Inc.  

## 2018-02-20 NOTE — Progress Notes (Addendum)
[facility] OFFICE PROGRESS NOTE   Diagnosis: Colon cancer  INTERVAL HISTORY:   Ms. Surprenant returns as scheduled.  She completed cycle 8 FOLFOX 02/04/2018.  Cycle 9 was held earlier this week due to diarrhea and weight loss.  Overall is feeling better.  She has been able to increase fluid intake orally.  No loose stools for the past 48 hours.  She continues to have intermittent dizziness.  This typically occurs after bending over.  She felt dizzy in the exam room when moving from a sitting to standing position.  Objective:  Vital signs in last 24 hours:  Blood pressure (!) 141/67, pulse 64, temperature 97.6 F (36.4 C), temperature source Oral, resp. rate 18, height _0  (1.6 m), weight 205 lb 9.6 oz (93.3 kg), last menstrual period 07/10/2001, SpO2 100 %.    HEENT: White coating over tongue.  No buccal thrush. Resp: Lungs clear bilaterally. Cardio: Regular rate and rhythm. GI: Abdomen soft and nontender.  No hepatomegaly. Vascular: No leg edema. Neuro: Alert and oriented. Skin: Normal skin turgor. Port-A-Cath without erythema.   Lab Results:  Lab Results  Component Value Date   WBC 3.3 (L) 02/20/2018   HGB 11.0 (L) 02/20/2018   HCT 33.3 (L) 02/20/2018   MCV 96.7 02/20/2018   PLT 205 02/20/2018   NEUTROABS 1.2 (L) 02/20/2018    Imaging:  No results found.  Medications: I have reviewed the patient's current medications.  Assessment/Plan: 1. 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 MSI-stable ? 3/14 lymph nodes positive for metastatic carcinoma, lymphovascular invasion present ? Cycle 1 FOLFOX 10/22/2017 ? CTs chest, abdomen, and pelvis 10/28/2017-negative for metastatic disease ? Cycle 2 FOLFOX 11/05/2017 ? Cycle 3 FOLFOX 11/27/2017 ? Cycle 4 FOLFOX 12/10/2017 ? Cycle 5 FOLFOX 12/24/2017 ? Cycle 6 FOLFOX 01/08/2018 (Oxaliplatin held due to thrombocytopenia) ? Cycle 7 FOLFOX 01/21/2018-oxaliplatin  resumed ? Cycle 8 FOLFOX 02/04/2018-Oxaliplatin eliminated due to an allergic reaction with cycle 7  2.Asthma  3.Port-A-Cath placement 10/17/2017  4.History of neutropenia secondary to chemotherapy-resolved  5.History of thrombocytopenia secondary to chemotherapy  6.Allergic reaction to Oxaliplatin 01/21/2018- pruritus, tachycardia, diaphoresis, hypotension  7.   Diarrhea following cycle 8 FOLFOX     Disposition: Ms. Pickford appears stable.  She has completed 8 cycles of FOLFOX.  Oxaliplatin has been eliminated from the regimen due to an allergic reaction.  Cycle 9 was held 02/18/2018 due to diarrhea/weight loss/dehydration.  She received IV fluids.  She overall is feeling better.  The diarrhea has improved significantly.  She continues to have mild intermittent dizziness.  She is not orthostatic.  We will give her an additional liter of IV fluids today.  She will continue to push fluids orally.  The 5-FU will be dose reduced with cycle 9.  Calcium was elevated from baseline earlier in the week.  She has primary hyperparathyroidism and was undergoing evaluation for this when she was diagnosed with colon cancer.  Calcium level today is better.  We will continue to monitor.  She will return for a follow-up chemistry panel in 1 week.  She will return for lab, follow-up and cycle 9 FOLFOX 03/04/2018.  She will contact the office in the interim with any problems.  Patient seen with Dr. Benay Spice.  25 minutes were spent face-to-face at today's visit with the majority of that time involved in counseling/coordination of care.      Ned Card ANP/GNP-BC   02/20/2018  12:31 PM  This was a shared visit with Ned Card.  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  The diarrhea has improved.  She will receive intravenous fluids today.  The 5-fluorouracil will be dose reduced with the next cycle.  Julieanne Manson,  MD

## 2018-02-21 ENCOUNTER — Other Ambulatory Visit: Payer: Self-pay | Admitting: Nurse Practitioner

## 2018-02-21 DIAGNOSIS — C182 Malignant neoplasm of ascending colon: Secondary | ICD-10-CM

## 2018-02-24 ENCOUNTER — Telehealth: Payer: Self-pay | Admitting: Oncology

## 2018-02-24 NOTE — Telephone Encounter (Signed)
Patient called to reschedule  °

## 2018-02-25 ENCOUNTER — Telehealth: Payer: Self-pay

## 2018-02-25 NOTE — Telephone Encounter (Addendum)
LVM for pt to return call clinic     ----- Message from Owens Shark, NP sent at 02/21/2018 10:21 AM EDT ----- Please let her know neutrophil count was mildly decreased on labs done Friday, call with fever/signs of infection

## 2018-02-26 ENCOUNTER — Telehealth: Payer: Self-pay | Admitting: Oncology

## 2018-02-26 ENCOUNTER — Other Ambulatory Visit: Payer: BC Managed Care – PPO

## 2018-02-26 NOTE — Telephone Encounter (Signed)
Patient cancelled per nurse its ok to wait until 6/26

## 2018-02-27 ENCOUNTER — Other Ambulatory Visit: Payer: BC Managed Care – PPO

## 2018-03-01 ENCOUNTER — Other Ambulatory Visit: Payer: Self-pay | Admitting: Oncology

## 2018-03-04 ENCOUNTER — Inpatient Hospital Stay: Payer: BC Managed Care – PPO

## 2018-03-04 ENCOUNTER — Telehealth: Payer: Self-pay | Admitting: Oncology

## 2018-03-04 ENCOUNTER — Inpatient Hospital Stay (HOSPITAL_BASED_OUTPATIENT_CLINIC_OR_DEPARTMENT_OTHER): Payer: BC Managed Care – PPO | Admitting: Oncology

## 2018-03-04 VITALS — BP 134/81 | HR 72 | Temp 98.8°F | Resp 18 | Ht 63.0 in | Wt 209.4 lb

## 2018-03-04 DIAGNOSIS — C182 Malignant neoplasm of ascending colon: Secondary | ICD-10-CM

## 2018-03-04 LAB — CBC WITH DIFFERENTIAL (CANCER CENTER ONLY)
Basophils Absolute: 0 10*3/uL (ref 0.0–0.1)
Basophils Relative: 1 %
Eosinophils Absolute: 0.1 10*3/uL (ref 0.0–0.5)
Eosinophils Relative: 2 %
HEMATOCRIT: 34.6 % — AB (ref 34.8–46.6)
Hemoglobin: 11.6 g/dL (ref 11.6–15.9)
Lymphocytes Relative: 29 %
Lymphs Abs: 1.8 10*3/uL (ref 0.9–3.3)
MCH: 32 pg (ref 25.1–34.0)
MCHC: 33.6 g/dL (ref 31.5–36.0)
MCV: 95.3 fL (ref 79.5–101.0)
MONO ABS: 0.6 10*3/uL (ref 0.1–0.9)
MONOS PCT: 10 %
NEUTROS ABS: 3.6 10*3/uL (ref 1.5–6.5)
Neutrophils Relative %: 58 %
Platelet Count: 239 10*3/uL (ref 145–400)
RBC: 3.63 MIL/uL — ABNORMAL LOW (ref 3.70–5.45)
RDW: 15.7 % — AB (ref 11.2–14.5)
WBC Count: 6.2 10*3/uL (ref 3.9–10.3)

## 2018-03-04 LAB — CMP (CANCER CENTER ONLY)
ALBUMIN: 3.7 g/dL (ref 3.5–5.0)
ALK PHOS: 127 U/L — AB (ref 38–126)
ALT: 37 U/L (ref 0–44)
ANION GAP: 11 (ref 5–15)
AST: 37 U/L (ref 15–41)
BUN: 10 mg/dL (ref 8–23)
CALCIUM: 10.6 mg/dL — AB (ref 8.9–10.3)
CO2: 22 mmol/L (ref 22–32)
Chloride: 106 mmol/L (ref 98–111)
Creatinine: 0.77 mg/dL (ref 0.44–1.00)
GFR, Est AFR Am: >60 mL/min (ref >60)
GFR, Estimated: >60 mL/min (ref >60)
GLUCOSE: 120 mg/dL — AB (ref 70–99)
Potassium: 3.5 mmol/L (ref 3.5–5.1)
SODIUM: 139 mmol/L (ref 135–145)
Total Bilirubin: 0.3 mg/dL (ref 0.3–1.2)
Total Protein: 6.5 g/dL (ref 6.5–8.1)

## 2018-03-04 LAB — MAGNESIUM: Magnesium: 1.8 mg/dL (ref 1.7–2.4)

## 2018-03-04 MED ORDER — FLUOROURACIL CHEMO INJECTION 5 GM/100ML
1800.0000 mg/m2 | INTRAVENOUS | Status: DC
  Administered 2018-03-04: 3800 mg via INTRAVENOUS
  Filled 2018-03-04: qty 76

## 2018-03-04 MED ORDER — DEXTROSE 5 % IV SOLN
Freq: Once | INTRAVENOUS | Status: AC
Start: 2018-03-04 — End: 2018-03-04
  Administered 2018-03-04: 12:00:00 via INTRAVENOUS

## 2018-03-04 MED ORDER — LEUCOVORIN CALCIUM INJECTION 350 MG
200.0000 mg/m2 | Freq: Once | INTRAMUSCULAR | Status: AC
  Administered 2018-03-04: 422 mg via INTRAVENOUS
  Filled 2018-03-04: qty 21.1

## 2018-03-04 NOTE — Progress Notes (Signed)
  [facility] OFFICE PROGRESS NOTE   Diagnosis: Colon cancer  INTERVAL HISTORY:   Sharon Allison returns as scheduled.  The last cycle of chemotherapy was delayed secondary to diarrhea.  The diarrhea has resolved.  She reports improvement in taste alteration.  She feels well.  Objective:  Vital signs in last 24 hours:  Blood pressure 134/81, pulse 72, temperature 98.8 F (37.1 C), temperature source Oral, resp. rate 18, height _0  (1.6 m), weight 209 lb 6.4 oz (95 kg), last menstrual period 07/10/2001, SpO2 100 %.    HEENT: White coat over the tongue, no buccal thrush, no ulcers Resp: Lungs clear bilaterally Cardio: Regular rate and rhythm GI: No hepatomegaly, nontender Vascular: No leg edema  Skin: Hyperpigmentation and skin thickening at the palms and soles  Portacath/PICC-without erythema  Lab Results:  Lab Results  Component Value Date   WBC 6.2 03/04/2018   HGB 11.6 03/04/2018   HCT 34.6 (L) 03/04/2018   MCV 95.3 03/04/2018   PLT 239 03/04/2018   NEUTROABS 3.6 03/04/2018    CMP  Lab Results  Component Value Date   NA 139 02/20/2018   K 3.8 02/20/2018   CL 106 02/20/2018   CO2 24 02/20/2018   GLUCOSE 98 02/20/2018   BUN 5 (L) 02/20/2018   CREATININE 0.73 02/20/2018   CALCIUM 12.3 (H) 02/20/2018   PROT 6.1 (L) 02/20/2018   ALBUMIN 3.7 02/20/2018   AST 37 (H) 02/20/2018   ALT 34 02/20/2018   ALKPHOS 134 02/20/2018   BILITOT 0.5 02/20/2018   GFRNONAA >60 02/20/2018   GFRAA >60 02/20/2018    Lab Results  Component Value Date   CEA1 <1.00 10/21/2017   Medications: I have reviewed the patient's current medications.   Assessment/Plan: 1. 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 MSI-stable ? 3/14 lymph nodes positive for metastatic carcinoma, lymphovascular invasion present ? Cycle 1 FOLFOX 10/22/2017 ? CTs chest, abdomen, and pelvis 10/28/2017-negative for metastatic disease ? Cycle 2  FOLFOX 11/05/2017 ? Cycle 3 FOLFOX 11/27/2017 ? Cycle 4 FOLFOX 12/10/2017 ? Cycle 5 FOLFOX 12/24/2017 ? Cycle 6 FOLFOX 01/08/2018 (Oxaliplatin held due to thrombocytopenia) ? Cycle 7 FOLFOX 01/21/2018-oxaliplatin resumed ? Cycle 8 FOLFOX 02/04/2018-Oxaliplatin eliminated due to an allergic reaction with cycle 7  2.Asthma  3.Port-A-Cath placement 10/17/2017  4.History of neutropenia secondary to chemotherapy-resolved  5.History of thrombocytopenia secondary to chemotherapy  6.Allergic reaction to Oxaliplatin 01/21/2018-pruritus, tachycardia, diaphoresis, hypotension  7.   Diarrhea following cycle 8 FOLFOX-resolved, 5-FU bolus eliminated and infusion dose decreased beginning with cycle 9   Disposition: Ms. Welte appears well.  The plan is to complete another cycle of chemotherapy today.  The 5-FU bolus will be eliminated and the 5-FU infusion dose reduced with this cycle. She will contact us for diarrhea.  She will return for an office visit in the next cycle of chemotherapy in 2 weeks.  Betsy Coder, MD  03/04/2018  10:09 AM

## 2018-03-04 NOTE — Patient Instructions (Signed)
Implanted Port Home Guide An implanted port is a type of central line that is placed under the skin. Central lines are used to provide IV access when treatment or nutrition needs to be given through a person's veins. Implanted ports are used for long-term IV access. An implanted port may be placed because:  You need IV medicine that would be irritating to the small veins in your hands or arms.  You need long-term IV medicines, such as antibiotics.  You need IV nutrition for a long period.  You need frequent blood draws for lab tests.  You need dialysis.  Implanted ports are usually placed in the chest area, but they can also be placed in the upper arm, the abdomen, or the leg. An implanted port has two main parts:  Reservoir. The reservoir is round and will appear as a small, raised area under your skin. The reservoir is the part where a needle is inserted to give medicines or draw blood.  Catheter. The catheter is a thin, flexible tube that extends from the reservoir. The catheter is placed into a large vein. Medicine that is inserted into the reservoir goes into the catheter and then into the vein.  How will I care for my incision site? Do not get the incision site wet. Bathe or shower as directed by your health care provider. How is my port accessed? Special steps must be taken to access the port:  Before the port is accessed, a numbing cream can be placed on the skin. This helps numb the skin over the port site.  Your health care provider uses a sterile technique to access the port. ? Your health care provider must put on a mask and sterile gloves. ? The skin over your port is cleaned carefully with an antiseptic and allowed to dry. ? The port is gently pinched between sterile gloves, and a needle is inserted into the port.  Only "non-coring" port needles should be used to access the port. Once the port is accessed, a blood return should be checked. This helps ensure that the port  is in the vein and is not clogged.  If your port needs to remain accessed for a constant infusion, a clear (transparent) bandage will be placed over the needle site. The bandage and needle will need to be changed every week, or as directed by your health care provider.  Keep the bandage covering the needle clean and dry. Do not get it wet. Follow your health care provider's instructions on how to take a shower or bath while the port is accessed.  If your port does not need to stay accessed, no bandage is needed over the port.  What is flushing? Flushing helps keep the port from getting clogged. Follow your health care provider's instructions on how and when to flush the port. Ports are usually flushed with saline solution or a medicine called heparin. The need for flushing will depend on how the port is used.  If the port is used for intermittent medicines or blood draws, the port will need to be flushed: ? After medicines have been given. ? After blood has been drawn. ? As part of routine maintenance.  If a constant infusion is running, the port may not need to be flushed.  How long will my port stay implanted? The port can stay in for as long as your health care provider thinks it is needed. When it is time for the port to come out, surgery will be   done to remove it. The procedure is similar to the one performed when the port was put in. When should I seek immediate medical care? When you have an implanted port, you should seek immediate medical care if:  You notice a bad smell coming from the incision site.  You have swelling, redness, or drainage at the incision site.  You have more swelling or pain at the port site or the surrounding area.  You have a fever that is not controlled with medicine.  This information is not intended to replace advice given to you by your health care provider. Make sure you discuss any questions you have with your health care provider. Document  Released: 08/26/2005 Document Revised: 02/01/2016 Document Reviewed: 05/03/2013 Elsevier Interactive Patient Education  2017 [employer]  

## 2018-03-04 NOTE — Telephone Encounter (Signed)
Scheduled appt per 6/26 los - pt to get an updated schedule in the treatment area. - pt aware.

## 2018-03-04 NOTE — Patient Instructions (Signed)
[facility] Discharge Instructions for Patients Receiving Chemotherapy  Today you received the following chemotherapy agents  Leucovorin and Adrucil   To help prevent nausea and vomiting after your treatment, we encourage you to take your nausea medication as directed. No Zofran for 3 days. Take Compazine instead.     If you develop nausea and vomiting that is not controlled by your nausea medication, call the clinic.   BELOW ARE SYMPTOMS THAT SHOULD BE REPORTED IMMEDIATELY:  *FEVER GREATER THAN 100.5 F  *CHILLS WITH OR WITHOUT FEVER  NAUSEA AND VOMITING THAT IS NOT CONTROLLED WITH YOUR NAUSEA MEDICATION  *UNUSUAL SHORTNESS OF BREATH  *UNUSUAL BRUISING OR BLEEDING  TENDERNESS IN MOUTH AND THROAT WITH OR WITHOUT PRESENCE OF ULCERS  *URINARY PROBLEMS  *BOWEL PROBLEMS  UNUSUAL RASH Items with * indicate a potential emergency and should be followed up as soon as possible.  Feel free to call the clinic should you have any questions or concerns. The clinic phone number is [phone].  Please show the [facility] at check-in to the Emergency Department and triage nurse.

## 2018-03-06 ENCOUNTER — Inpatient Hospital Stay: Payer: BC Managed Care – PPO

## 2018-03-06 VITALS — BP 140/72 | HR 66 | Temp 98.1°F | Resp 18

## 2018-03-06 DIAGNOSIS — C182 Malignant neoplasm of ascending colon: Secondary | ICD-10-CM

## 2018-03-06 MED ORDER — SODIUM CHLORIDE 0.9% FLUSH
10.0000 mL | INTRAVENOUS | Status: DC | PRN
Start: 2018-03-06 — End: 2018-03-06
  Administered 2018-03-06: 10 mL
  Filled 2018-03-06: qty 10

## 2018-03-06 MED ORDER — HEPARIN SOD (PORK) LOCK FLUSH 100 UNIT/ML IV SOLN
500.0000 [IU] | Freq: Once | INTRAVENOUS | Status: AC | PRN
  Administered 2018-03-06: 500 [IU]
  Filled 2018-03-06: qty 5

## 2018-03-14 ENCOUNTER — Other Ambulatory Visit: Payer: Self-pay | Admitting: Oncology

## 2018-03-17 ENCOUNTER — Inpatient Hospital Stay: Payer: BC Managed Care – PPO

## 2018-03-17 ENCOUNTER — Telehealth: Payer: Self-pay | Admitting: Nurse Practitioner

## 2018-03-17 ENCOUNTER — Encounter: Payer: BC Managed Care – PPO | Admitting: Nutrition

## 2018-03-17 ENCOUNTER — Inpatient Hospital Stay (HOSPITAL_BASED_OUTPATIENT_CLINIC_OR_DEPARTMENT_OTHER): Payer: BC Managed Care – PPO | Admitting: Nurse Practitioner

## 2018-03-17 ENCOUNTER — Inpatient Hospital Stay: Payer: BC Managed Care – PPO | Attending: Oncology

## 2018-03-17 ENCOUNTER — Encounter: Payer: Self-pay | Admitting: Nurse Practitioner

## 2018-03-17 VITALS — BP 140/78 | HR 64 | Temp 98.4°F | Resp 17 | Ht 63.0 in | Wt 213.0 lb

## 2018-03-17 DIAGNOSIS — C180 Malignant neoplasm of cecum: Secondary | ICD-10-CM | POA: Insufficient documentation

## 2018-03-17 DIAGNOSIS — L819 Disorder of pigmentation, unspecified: Secondary | ICD-10-CM | POA: Insufficient documentation

## 2018-03-17 DIAGNOSIS — Z452 Encounter for adjustment and management of vascular access device: Secondary | ICD-10-CM | POA: Diagnosis not present

## 2018-03-17 DIAGNOSIS — C182 Malignant neoplasm of ascending colon: Secondary | ICD-10-CM

## 2018-03-17 DIAGNOSIS — Z5111 Encounter for antineoplastic chemotherapy: Secondary | ICD-10-CM | POA: Diagnosis present

## 2018-03-17 DIAGNOSIS — Z95828 Presence of other vascular implants and grafts: Secondary | ICD-10-CM

## 2018-03-17 LAB — MAGNESIUM: MAGNESIUM: 1.8 mg/dL (ref 1.7–2.4)

## 2018-03-17 LAB — CMP (CANCER CENTER ONLY)
ALT: 27 U/L (ref 0–44)
AST: 21 U/L (ref 15–41)
Albumin: 3.6 g/dL (ref 3.5–5.0)
Alkaline Phosphatase: 104 U/L (ref 38–126)
Anion gap: 7 (ref 5–15)
BILIRUBIN TOTAL: 0.4 mg/dL (ref 0.3–1.2)
BUN: 11 mg/dL (ref 8–23)
CO2: 26 mmol/L (ref 22–32)
Calcium: 10 mg/dL (ref 8.9–10.3)
Chloride: 108 mmol/L (ref 98–111)
Creatinine: 0.78 mg/dL (ref 0.44–1.00)
GFR, Est AFR Am: >60 mL/min (ref >60)
Glucose, Bld: 101 mg/dL — ABNORMAL HIGH (ref 70–99)
POTASSIUM: 3.6 mmol/L (ref 3.5–5.1)
Sodium: 141 mmol/L (ref 135–145)
TOTAL PROTEIN: 6.3 g/dL — AB (ref 6.5–8.1)

## 2018-03-17 LAB — CBC WITH DIFFERENTIAL (CANCER CENTER ONLY)
BASOS ABS: 0 10*3/uL (ref 0.0–0.1)
BASOS PCT: 1 %
EOS ABS: 0.3 10*3/uL (ref 0.0–0.5)
Eosinophils Relative: 5 %
HCT: 31.7 % — ABNORMAL LOW (ref 34.8–46.6)
Hemoglobin: 10.7 g/dL — ABNORMAL LOW (ref 11.6–15.9)
LYMPHS PCT: 35 %
Lymphs Abs: 2.3 10*3/uL (ref 0.9–3.3)
MCH: 31.8 pg (ref 25.1–34.0)
MCHC: 33.8 g/dL (ref 31.5–36.0)
MCV: 94.3 fL (ref 79.5–101.0)
Monocytes Absolute: 0.5 10*3/uL (ref 0.1–0.9)
Monocytes Relative: 8 %
Neutro Abs: 3.4 10*3/uL (ref 1.5–6.5)
Neutrophils Relative %: 51 %
PLATELETS: 192 10*3/uL (ref 145–400)
RBC: 3.36 MIL/uL — AB (ref 3.70–5.45)
RDW: 14.2 % (ref 11.2–14.5)
WBC: 6.5 10*3/uL (ref 3.9–10.3)

## 2018-03-17 MED ORDER — DEXTROSE 5 % IV SOLN
Freq: Once | INTRAVENOUS | Status: AC
  Administered 2018-03-17: 12:00:00 via INTRAVENOUS

## 2018-03-17 MED ORDER — SODIUM CHLORIDE 0.9 % IV SOLN
1800.0000 mg/m2 | INTRAVENOUS | Status: DC
  Administered 2018-03-17: 3800 mg via INTRAVENOUS
  Filled 2018-03-17: qty 76

## 2018-03-17 MED ORDER — LEUCOVORIN CALCIUM INJECTION 350 MG
200.0000 mg/m2 | Freq: Once | INTRAVENOUS | Status: AC
  Administered 2018-03-17: 422 mg via INTRAVENOUS
  Filled 2018-03-17: qty 21.1

## 2018-03-17 MED ORDER — SODIUM CHLORIDE 0.9% FLUSH
10.0000 mL | Freq: Once | INTRAVENOUS | Status: AC
  Administered 2018-03-17: 10 mL
  Filled 2018-03-17: qty 10

## 2018-03-17 NOTE — Progress Notes (Signed)
  [facility] OFFICE PROGRESS NOTE   Diagnosis: Colon cancer  INTERVAL HISTORY:   Ms. Morissette returns as scheduled.  She completed cycle 9 FOLFOX 03/04/2018.  No Oxaliplatin due to a previous allergic reaction.  5-FU bolus was eliminated and 5-FU infusion dose reduced due to diarrhea with the previous cycle.  He denies nausea/vomiting.  No mouth sores.  No diarrhea.  She has intermittent numbness and tingling in the feet.  She notes foot cramps at times.  Hand darkening is better.  Objective:  Vital signs in last 24 hours:  Blood pressure 140/78, pulse 64, temperature 98.4 F (36.9 C), temperature source Oral, resp. rate 17, height '5\' 3"'$  (1.6 m), weight 213 lb (96.6 kg), last menstrual period 07/10/2001, SpO2 100 %.    HEENT: No thrush or ulcers. Resp: Lungs clear bilaterally. Cardio: Regular rate and rhythm. GI: Abdomen soft and nontender.  No hepatomegaly. Vascular: No leg edema.  Skin: Palms with mild hyperpigmentation. Port-A-Cath without erythema.   Lab Results:  Lab Results  Component Value Date   WBC 6.2 03/04/2018   HGB 11.6 03/04/2018   HCT 34.6 (L) 03/04/2018   MCV 95.3 03/04/2018   PLT 239 03/04/2018   NEUTROABS 3.6 03/04/2018    Imaging:  No results found.  Medications: I have reviewed the patient's current medications.  Assessment/Plan: 1. 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 MSI-stable ? 3/14 lymph nodes positive for metastatic carcinoma, lymphovascular invasion present ? Cycle 1 FOLFOX 10/22/2017 ? CTs chest, abdomen, and pelvis 10/28/2017-negative for metastatic disease ? Cycle 2 FOLFOX 11/05/2017 ? Cycle 3 FOLFOX 11/27/2017 ? Cycle 4 FOLFOX 12/10/2017 ? Cycle 5 FOLFOX 12/24/2017 ? Cycle 6 FOLFOX 01/08/2018 (Oxaliplatin held due to thrombocytopenia) ? Cycle 7 FOLFOX 01/21/2018-oxaliplatin resumed ? Cycle 8 FOLFOX 02/04/2018-Oxaliplatin eliminated due to an allergic reaction with cycle  7 ? Cycle 9 FOLFOX 03/04/2018- no Oxaliplatin; 5-FU bolus eliminated, infusional 5-FU dose reduced ? Cycle 10 FOLFOX 03/17/2018  2.Asthma  3.Port-A-Cath placement 10/17/2017  4.History of neutropenia secondary to chemotherapy-resolved  5.History of thrombocytopenia secondary to chemotherapy  6.Allergic reaction to Oxaliplatin 01/21/2018-pruritus, tachycardia, diaphoresis, hypotension  7.Diarrhea following cycle 8 FOLFOX-resolved, 5-FU bolus eliminated and infusion dose decreased beginning with cycle 9   Disposition: Ms. Caputo appears stable.  She has completed 9 cycles of FOLFOX.  She is no longer receiving Oxaliplatin.  The 5-FU bolus has been eliminated and the infusional 5-FU was dose reduced beginning with cycle 9.  Plan to continue the same, cycle 10 today.  She will return for lab, follow-up and cycle 11 in 2 weeks.  She will contact the office in the interim with any problems.    Ned Card ANP/GNP-BC   03/17/2018  9:57 AM

## 2018-03-17 NOTE — Patient Instructions (Signed)
[facility] Discharge Instructions for Patients Receiving Chemotherapy  Today you received the following chemotherapy agents: Leucovorin and Fluorouracil (Adrucil, 5-FU)  To help prevent nausea and vomiting after your treatment, we encourage you to take your nausea medication as prescribed.    If you develop nausea and vomiting that is not controlled by your nausea medication, call the clinic.   BELOW ARE SYMPTOMS THAT SHOULD BE REPORTED IMMEDIATELY:  *FEVER GREATER THAN 100.5 F  *CHILLS WITH OR WITHOUT FEVER  NAUSEA AND VOMITING THAT IS NOT CONTROLLED WITH YOUR NAUSEA MEDICATION  *UNUSUAL SHORTNESS OF BREATH  *UNUSUAL BRUISING OR BLEEDING  TENDERNESS IN MOUTH AND THROAT WITH OR WITHOUT PRESENCE OF ULCERS  *URINARY PROBLEMS  *BOWEL PROBLEMS  UNUSUAL RASH Items with * indicate a potential emergency and should be followed up as soon as possible.  Feel free to call the clinic should you have any questions or concerns. The clinic phone number is [phone].  Please show the [facility] at check-in to the Emergency Department and triage nurse.

## 2018-03-17 NOTE — Telephone Encounter (Signed)
Scheduled appt per 7/9 los - gave patient AVS and calender per los.  

## 2018-03-18 ENCOUNTER — Other Ambulatory Visit: Payer: BC Managed Care – PPO

## 2018-03-18 ENCOUNTER — Ambulatory Visit: Payer: BC Managed Care – PPO

## 2018-03-18 ENCOUNTER — Ambulatory Visit: Payer: BC Managed Care – PPO | Admitting: Nurse Practitioner

## 2018-03-18 ENCOUNTER — Encounter: Payer: BC Managed Care – PPO | Admitting: Nutrition

## 2018-03-19 ENCOUNTER — Inpatient Hospital Stay: Payer: BC Managed Care – PPO

## 2018-03-19 VITALS — BP 121/75 | HR 66 | Temp 98.2°F | Resp 18

## 2018-03-19 DIAGNOSIS — C182 Malignant neoplasm of ascending colon: Secondary | ICD-10-CM

## 2018-03-19 DIAGNOSIS — Z452 Encounter for adjustment and management of vascular access device: Secondary | ICD-10-CM | POA: Diagnosis not present

## 2018-03-19 MED ORDER — HEPARIN SOD (PORK) LOCK FLUSH 100 UNIT/ML IV SOLN
500.0000 [IU] | Freq: Once | INTRAVENOUS | Status: AC | PRN
  Administered 2018-03-19: 500 [IU]
  Filled 2018-03-19: qty 5

## 2018-03-19 MED ORDER — SODIUM CHLORIDE 0.9% FLUSH
10.0000 mL | INTRAVENOUS | Status: DC | PRN
  Administered 2018-03-19: 10 mL
  Filled 2018-03-19: qty 10

## 2018-03-27 ENCOUNTER — Other Ambulatory Visit: Payer: Self-pay | Admitting: Oncology

## 2018-03-31 ENCOUNTER — Inpatient Hospital Stay: Payer: BC Managed Care – PPO

## 2018-03-31 ENCOUNTER — Encounter: Payer: Self-pay | Admitting: Nurse Practitioner

## 2018-03-31 ENCOUNTER — Inpatient Hospital Stay (HOSPITAL_BASED_OUTPATIENT_CLINIC_OR_DEPARTMENT_OTHER): Payer: BC Managed Care – PPO | Admitting: Nurse Practitioner

## 2018-03-31 VITALS — BP 132/69 | HR 68 | Temp 97.7°F | Resp 18 | Ht 63.0 in | Wt 215.6 lb

## 2018-03-31 DIAGNOSIS — C182 Malignant neoplasm of ascending colon: Secondary | ICD-10-CM

## 2018-03-31 DIAGNOSIS — Z452 Encounter for adjustment and management of vascular access device: Secondary | ICD-10-CM | POA: Diagnosis not present

## 2018-03-31 DIAGNOSIS — Z95828 Presence of other vascular implants and grafts: Secondary | ICD-10-CM

## 2018-03-31 DIAGNOSIS — C180 Malignant neoplasm of cecum: Secondary | ICD-10-CM

## 2018-03-31 LAB — CMP (CANCER CENTER ONLY)
ALT: 17 U/L (ref 0–44)
AST: 17 U/L (ref 15–41)
Albumin: 3.7 g/dL (ref 3.5–5.0)
Alkaline Phosphatase: 120 U/L (ref 38–126)
Anion gap: 9 (ref 5–15)
BUN: 11 mg/dL (ref 8–23)
CALCIUM: 9.9 mg/dL (ref 8.9–10.3)
CHLORIDE: 107 mmol/L (ref 98–111)
CO2: 25 mmol/L (ref 22–32)
CREATININE: 0.75 mg/dL (ref 0.44–1.00)
GFR, Est AFR Am: >60 mL/min (ref >60)
GFR, Estimated: >60 mL/min (ref >60)
GLUCOSE: 108 mg/dL — AB (ref 70–99)
Potassium: 3.5 mmol/L (ref 3.5–5.1)
SODIUM: 141 mmol/L (ref 135–145)
Total Bilirubin: 0.3 mg/dL (ref 0.3–1.2)
Total Protein: 6.6 g/dL (ref 6.5–8.1)

## 2018-03-31 LAB — CBC WITH DIFFERENTIAL (CANCER CENTER ONLY)
Basophils Absolute: 0 10*3/uL (ref 0.0–0.1)
Basophils Relative: 0 %
Eosinophils Absolute: 0.3 10*3/uL (ref 0.0–0.5)
Eosinophils Relative: 4 %
HEMATOCRIT: 34.1 % — AB (ref 34.8–46.6)
HEMOGLOBIN: 11.3 g/dL — AB (ref 11.6–15.9)
LYMPHS ABS: 2 10*3/uL (ref 0.9–3.3)
LYMPHS PCT: 32 %
MCH: 31.4 pg (ref 25.1–34.0)
MCHC: 33.1 g/dL (ref 31.5–36.0)
MCV: 94.7 fL (ref 79.5–101.0)
MONOS PCT: 7 %
Monocytes Absolute: 0.4 10*3/uL (ref 0.1–0.9)
NEUTROS PCT: 57 %
Neutro Abs: 3.5 10*3/uL (ref 1.5–6.5)
Platelet Count: 256 10*3/uL (ref 145–400)
RBC: 3.6 MIL/uL — AB (ref 3.70–5.45)
RDW: 13.8 % (ref 11.2–14.5)
WBC: 6.2 10*3/uL (ref 3.9–10.3)

## 2018-03-31 MED ORDER — DEXTROSE 5 % IV SOLN: Freq: Once | INTRAVENOUS | Status: DC

## 2018-03-31 MED ORDER — DEXTROSE 5 % IV SOLN
200.0000 mg/m2 | Freq: Once | INTRAVENOUS | Status: AC
  Administered 2018-03-31: 422 mg via INTRAVENOUS
  Filled 2018-03-31: qty 21.1

## 2018-03-31 MED ORDER — SODIUM CHLORIDE 0.9 % IV SOLN
1800.0000 mg/m2 | INTRAVENOUS | Status: DC
  Administered 2018-03-31: 3800 mg via INTRAVENOUS
  Filled 2018-03-31: qty 76

## 2018-03-31 MED ORDER — SODIUM CHLORIDE 0.9% FLUSH
10.0000 mL | Freq: Once | INTRAVENOUS | Status: AC
  Administered 2018-03-31: 10 mL
  Filled 2018-03-31: qty 10

## 2018-03-31 NOTE — Patient Instructions (Signed)
[facility] Discharge Instructions for Patients Receiving Chemotherapy  Today you received the following chemotherapy agents: Leucovorin and Fluorouracil (Adrucil, 5-FU)  To help prevent nausea and vomiting after your treatment, we encourage you to take your nausea medication as prescribed.    If you develop nausea and vomiting that is not controlled by your nausea medication, call the clinic.   BELOW ARE SYMPTOMS THAT SHOULD BE REPORTED IMMEDIATELY:  *FEVER GREATER THAN 100.5 F  *CHILLS WITH OR WITHOUT FEVER  NAUSEA AND VOMITING THAT IS NOT CONTROLLED WITH YOUR NAUSEA MEDICATION  *UNUSUAL SHORTNESS OF BREATH  *UNUSUAL BRUISING OR BLEEDING  TENDERNESS IN MOUTH AND THROAT WITH OR WITHOUT PRESENCE OF ULCERS  *URINARY PROBLEMS  *BOWEL PROBLEMS  UNUSUAL RASH Items with * indicate a potential emergency and should be followed up as soon as possible.  Feel free to call the clinic should you have any questions or concerns. The clinic phone number is [phone].  Please show the [place] at check-in to the Emergency Department and triage nurse.

## 2018-03-31 NOTE — Progress Notes (Signed)
  [facility] OFFICE PROGRESS NOTE   Diagnosis: Colon cancer  INTERVAL HISTORY:   Sharon Allison returns as scheduled.  She completed cycle 10 FOLFOX 03/17/2018.  She is no longer receiving Oxaliplatin due to a previous allergic reaction.  She denies nausea/vomiting.  No mouth sores.  No diarrhea.  No hand or foot pain or redness.  Some numbness in the toes.  The numbness does not interfere with activity.  Following the last cycle of chemotherapy she noted redness/itching around the Port-A-Cath in the distribution of the adhesive.  Objective:  Vital signs in last 24 hours:  Blood pressure 132/69, pulse 68, temperature 97.7 F (36.5 C), temperature source Oral, resp. rate 18, height 5' 3" (1.6 m), weight 215 lb 9.6 oz (97.8 kg), last menstrual period 07/10/2001, SpO2 99 %.    HEENT: No thrush or ulcers. Resp: Lungs clear bilaterally. Cardio: Regular rate and rhythm. GI: Abdomen soft and nontender.  No hepatomegaly. Vascular: No leg edema.  Skin: Palms without erythema. Port-A-Cath without erythema.   Lab Results:  Lab Results  Component Value Date   WBC 6.2 03/31/2018   HGB 11.3 (L) 03/31/2018   HCT 34.1 (L) 03/31/2018   MCV 94.7 03/31/2018   PLT 256 03/31/2018   NEUTROABS 3.5 03/31/2018    Imaging:  No results found.  Medications: I have reviewed the patient's current medications.  Assessment/Plan: 1. 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 MSI-stable ? 3/14 lymph nodes positive for metastatic carcinoma, lymphovascular invasion present ? Cycle 1 FOLFOX 10/22/2017 ? CTs chest, abdomen, and pelvis 10/28/2017-negative for metastatic disease ? Cycle 2 FOLFOX 11/05/2017 ? Cycle 3 FOLFOX 11/27/2017 ? Cycle 4 FOLFOX 12/10/2017 ? Cycle 5 FOLFOX 12/24/2017 ? Cycle 6 FOLFOX 01/08/2018 (Oxaliplatin held due to thrombocytopenia) ? Cycle 7 FOLFOX 01/21/2018-oxaliplatin resumed ? Cycle 8 FOLFOX 02/04/2018-Oxaliplatin  eliminated due to an allergic reaction with cycle 7 ? Cycle 9 FOLFOX 03/04/2018- no Oxaliplatin; 5-FU bolus eliminated, infusional 5-FU dose reduced ? Cycle 10 FOLFOX 03/17/2018 ? Cycle 11 FOLFOX 03/31/2018  2.Asthma  3.Port-A-Cath placement 10/17/2017  4.History of neutropenia secondary to chemotherapy-resolved  5.History of thrombocytopenia secondary to chemotherapy  6.Allergic reaction to Oxaliplatin 01/21/2018-pruritus, tachycardia, diaphoresis, hypotension  7.Diarrhea following cycle 8 FOLFOX-resolved, 5-FU bolus eliminated and infusion dose decreased beginning with cycle 9    Disposition: [name] appears stable.  She has completed 10 cycles of FOLFOX.  She is no longer receiving Oxaliplatin, the 5-FU bolus has been eliminated and infusional 5-FU has been dose reduced.  Plan to proceed with cycle 11 today as scheduled.  Labs from today reviewed.  Counts are adequate for treatment.  She likely had a reaction to the Port-A-Cath dressing adhesive.  We will change to an OpSite dressing.  She will return for lab, follow-up and cycle 12 FOLFOX in 2 weeks.  She will contact the office in the interim with any problems.  Plan reviewed with Dr. Benay Spice.    Ned Card ANP/GNP-BC   03/31/2018  10:39 AM

## 2018-03-31 NOTE — Patient Instructions (Signed)
Implanted Port Home Guide An implanted port is a type of central line that is placed under the skin. Central lines are used to provide IV access when treatment or nutrition needs to be given through a person's veins. Implanted ports are used for long-term IV access. An implanted port may be placed because:  You need IV medicine that would be irritating to the small veins in your hands or arms.  You need long-term IV medicines, such as antibiotics.  You need IV nutrition for a long period.  You need frequent blood draws for lab tests.  You need dialysis.  Implanted ports are usually placed in the chest area, but they can also be placed in the upper arm, the abdomen, or the leg. An implanted port has two main parts:  Reservoir. The reservoir is round and will appear as a small, raised area under your skin. The reservoir is the part where a needle is inserted to give medicines or draw blood.  Catheter. The catheter is a thin, flexible tube that extends from the reservoir. The catheter is placed into a large vein. Medicine that is inserted into the reservoir goes into the catheter and then into the vein.  How will I care for my incision site? Do not get the incision site wet. Bathe or shower as directed by your health care provider. How is my port accessed? Special steps must be taken to access the port:  Before the port is accessed, a numbing cream can be placed on the skin. This helps numb the skin over the port site.  Your health care provider uses a sterile technique to access the port. ? Your health care provider must put on a mask and sterile gloves. ? The skin over your port is cleaned carefully with an antiseptic and allowed to dry. ? The port is gently pinched between sterile gloves, and a needle is inserted into the port.  Only "non-coring" port needles should be used to access the port. Once the port is accessed, a blood return should be checked. This helps ensure that the port  is in the vein and is not clogged.  If your port needs to remain accessed for a constant infusion, a clear (transparent) bandage will be placed over the needle site. The bandage and needle will need to be changed every week, or as directed by your health care provider.  Keep the bandage covering the needle clean and dry. Do not get it wet. Follow your health care provider's instructions on how to take a shower or bath while the port is accessed.  If your port does not need to stay accessed, no bandage is needed over the port.  What is flushing? Flushing helps keep the port from getting clogged. Follow your health care provider's instructions on how and when to flush the port. Ports are usually flushed with saline solution or a medicine called heparin. The need for flushing will depend on how the port is used.  If the port is used for intermittent medicines or blood draws, the port will need to be flushed: ? After medicines have been given. ? After blood has been drawn. ? As part of routine maintenance.  If a constant infusion is running, the port may not need to be flushed.  How long will my port stay implanted? The port can stay in for as long as your health care provider thinks it is needed. When it is time for the port to come out, surgery will be   done to remove it. The procedure is similar to the one performed when the port was put in. When should I seek immediate medical care? When you have an implanted port, you should seek immediate medical care if:  You notice a bad smell coming from the incision site.  You have swelling, redness, or drainage at the incision site.  You have more swelling or pain at the port site or the surrounding area.  You have a fever that is not controlled with medicine.  This information is not intended to replace advice given to you by your health care provider. Make sure you discuss any questions you have with your health care provider. Document  Released: 08/26/2005 Document Revised: 02/01/2016 Document Reviewed: 05/03/2013 Elsevier Interactive Patient Education  2017 [employer]  

## 2018-04-02 ENCOUNTER — Inpatient Hospital Stay: Payer: BC Managed Care – PPO

## 2018-04-02 VITALS — BP 129/81 | HR 78 | Temp 98.3°F | Resp 18

## 2018-04-02 DIAGNOSIS — C182 Malignant neoplasm of ascending colon: Secondary | ICD-10-CM

## 2018-04-02 DIAGNOSIS — Z452 Encounter for adjustment and management of vascular access device: Secondary | ICD-10-CM | POA: Diagnosis not present

## 2018-04-02 MED ORDER — SODIUM CHLORIDE 0.9% FLUSH
10.0000 mL | INTRAVENOUS | Status: DC | PRN
  Administered 2018-04-02: 10 mL
  Filled 2018-04-02: qty 10

## 2018-04-02 MED ORDER — HEPARIN SOD (PORK) LOCK FLUSH 100 UNIT/ML IV SOLN
500.0000 [IU] | Freq: Once | INTRAVENOUS | Status: AC | PRN
  Administered 2018-04-02: 500 [IU]
  Filled 2018-04-02: qty 5

## 2018-04-14 ENCOUNTER — Inpatient Hospital Stay: Payer: BC Managed Care – PPO

## 2018-04-14 ENCOUNTER — Inpatient Hospital Stay (HOSPITAL_BASED_OUTPATIENT_CLINIC_OR_DEPARTMENT_OTHER): Payer: BC Managed Care – PPO | Admitting: Oncology

## 2018-04-14 ENCOUNTER — Inpatient Hospital Stay: Payer: BC Managed Care – PPO | Attending: Oncology

## 2018-04-14 ENCOUNTER — Telehealth: Payer: Self-pay | Admitting: Oncology

## 2018-04-14 VITALS — BP 146/84 | HR 66 | Temp 98.1°F | Resp 18 | Ht 63.0 in | Wt 217.7 lb

## 2018-04-14 DIAGNOSIS — Z452 Encounter for adjustment and management of vascular access device: Secondary | ICD-10-CM | POA: Diagnosis present

## 2018-04-14 DIAGNOSIS — L819 Disorder of pigmentation, unspecified: Secondary | ICD-10-CM

## 2018-04-14 DIAGNOSIS — L299 Pruritus, unspecified: Secondary | ICD-10-CM | POA: Diagnosis not present

## 2018-04-14 DIAGNOSIS — C779 Secondary and unspecified malignant neoplasm of lymph node, unspecified: Secondary | ICD-10-CM

## 2018-04-14 DIAGNOSIS — C182 Malignant neoplasm of ascending colon: Secondary | ICD-10-CM

## 2018-04-14 DIAGNOSIS — D6959 Other secondary thrombocytopenia: Secondary | ICD-10-CM | POA: Diagnosis not present

## 2018-04-14 DIAGNOSIS — L270 Generalized skin eruption due to drugs and medicaments taken internally: Secondary | ICD-10-CM

## 2018-04-14 DIAGNOSIS — C180 Malignant neoplasm of cecum: Secondary | ICD-10-CM | POA: Insufficient documentation

## 2018-04-14 DIAGNOSIS — Z5111 Encounter for antineoplastic chemotherapy: Secondary | ICD-10-CM | POA: Insufficient documentation

## 2018-04-14 DIAGNOSIS — Z95828 Presence of other vascular implants and grafts: Secondary | ICD-10-CM

## 2018-04-14 LAB — CBC WITH DIFFERENTIAL (CANCER CENTER ONLY)
BASOS PCT: 1 %
Basophils Absolute: 0.1 10*3/uL (ref 0.0–0.1)
Eosinophils Absolute: 0.2 10*3/uL (ref 0.0–0.5)
Eosinophils Relative: 3 %
HEMATOCRIT: 32.7 % — AB (ref 34.8–46.6)
Hemoglobin: 11.1 g/dL — ABNORMAL LOW (ref 11.6–15.9)
Lymphocytes Relative: 27 %
Lymphs Abs: 1.6 10*3/uL (ref 0.9–3.3)
MCH: 31.4 pg (ref 25.1–34.0)
MCHC: 33.8 g/dL (ref 31.5–36.0)
MCV: 93 fL (ref 79.5–101.0)
Monocytes Absolute: 0.5 10*3/uL (ref 0.1–0.9)
Monocytes Relative: 8 %
NEUTROS ABS: 3.7 10*3/uL (ref 1.5–6.5)
NEUTROS PCT: 61 %
Platelet Count: 201 10*3/uL (ref 145–400)
RBC: 3.52 MIL/uL — ABNORMAL LOW (ref 3.70–5.45)
RDW: 14.3 % (ref 11.2–14.5)
WBC Count: 6 10*3/uL (ref 3.9–10.3)

## 2018-04-14 LAB — CMP (CANCER CENTER ONLY)
ALBUMIN: 3.5 g/dL (ref 3.5–5.0)
ALK PHOS: 103 U/L (ref 38–126)
ALT: 15 U/L (ref 0–44)
ANION GAP: 9 (ref 5–15)
AST: 15 U/L (ref 15–41)
BILIRUBIN TOTAL: 0.5 mg/dL (ref 0.3–1.2)
BUN: 9 mg/dL (ref 8–23)
CO2: 25 mmol/L (ref 22–32)
Calcium: 9.7 mg/dL (ref 8.9–10.3)
Chloride: 105 mmol/L (ref 98–111)
Creatinine: 0.76 mg/dL (ref 0.44–1.00)
GFR, Estimated: >60 mL/min (ref >60)
Glucose, Bld: 112 mg/dL — ABNORMAL HIGH (ref 70–99)
Potassium: 3.1 mmol/L — ABNORMAL LOW (ref 3.5–5.1)
SODIUM: 139 mmol/L (ref 135–145)
TOTAL PROTEIN: 6.4 g/dL — AB (ref 6.5–8.1)

## 2018-04-14 LAB — CEA (IN HOUSE-CHCC): CEA (CHCC-In House): 1.61 ng/mL (ref 0.00–5.00)

## 2018-04-14 MED ORDER — POTASSIUM CHLORIDE CRYS ER 20 MEQ PO TBCR: 20.0000 meq | EXTENDED_RELEASE_TABLET | Freq: Every day | ORAL | 0 refills | Status: DC

## 2018-04-14 MED ORDER — SODIUM CHLORIDE 0.9% FLUSH
10.0000 mL | Freq: Once | INTRAVENOUS | Status: AC
  Administered 2018-04-14: 10 mL
  Filled 2018-04-14: qty 10

## 2018-04-14 MED ORDER — SODIUM CHLORIDE 0.9 % IV SOLN
1800.0000 mg/m2 | INTRAVENOUS | Status: DC
  Administered 2018-04-14: 3800 mg via INTRAVENOUS
  Filled 2018-04-14: qty 76

## 2018-04-14 MED ORDER — LEUCOVORIN CALCIUM INJECTION 350 MG
200.0000 mg/m2 | Freq: Once | INTRAVENOUS | Status: AC
  Administered 2018-04-14: 422 mg via INTRAVENOUS
  Filled 2018-04-14: qty 21.1

## 2018-04-14 MED ORDER — DEXTROSE 5 % IV SOLN
Freq: Once | INTRAVENOUS | Status: AC
  Administered 2018-04-14: 12:00:00 via INTRAVENOUS
  Filled 2018-04-14: qty 250

## 2018-04-14 NOTE — Patient Instructions (Signed)
[facility] Discharge Instructions for Patients Receiving Chemotherapy  Today you received the following chemotherapy agents: Leucovorin and Fluorouracil (Adrucil, 5-FU)  To help prevent nausea and vomiting after your treatment, we encourage you to take your nausea medication as prescribed.    If you develop nausea and vomiting that is not controlled by your nausea medication, call the clinic.   BELOW ARE SYMPTOMS THAT SHOULD BE REPORTED IMMEDIATELY:  *FEVER GREATER THAN 100.5 F  *CHILLS WITH OR WITHOUT FEVER  NAUSEA AND VOMITING THAT IS NOT CONTROLLED WITH YOUR NAUSEA MEDICATION  *UNUSUAL SHORTNESS OF BREATH  *UNUSUAL BRUISING OR BLEEDING  TENDERNESS IN MOUTH AND THROAT WITH OR WITHOUT PRESENCE OF ULCERS  *URINARY PROBLEMS  *BOWEL PROBLEMS  UNUSUAL RASH Items with * indicate a potential emergency and should be followed up as soon as possible.  Feel free to call the clinic should you have any questions or concerns. The clinic phone number is [phone].  Please show the [place] at check-in to the Emergency Department and triage nurse.

## 2018-04-14 NOTE — Progress Notes (Signed)
  [facility] OFFICE PROGRESS NOTE   Diagnosis: Colon cancer  INTERVAL HISTORY:   Sharon Allison completed a cycle of 5-fluorouracil beginning 03/31/2018.  She ports one episode of diarrhea relieved with Imodium.  No mouth sores.  Hyperpigmentation has improved at the hands. She reports intermittent pruritus, relieved with Benadryl.  She has areas of erythema that resolved.  Objective:  Vital signs in last 24 hours:  Blood pressure (!) 146/84, pulse 66, temperature 98.1 F (36.7 C), temperature source Oral, resp. rate 18, height '5\' 3"'$  (1.6 m), weight 217 lb 11.2 oz (98.7 kg), last menstrual period 07/10/2001, SpO2 99 %.    HEENT: No thrush or ulcers Resp: Lungs clear bilaterally, no respiratory distress Cardio: Regular rate and rhythm GI: No hepatomegaly, nontender Vascular: No leg edema  Skin: Skin thickening and hyperpigmentation of the hands and feet.  No skin breakdown.,  Linear area of faint erythema at the mid lower back  Portacath/PICC-without erythema  Lab Results:  Lab Results  Component Value Date   WBC 6.0 04/14/2018   HGB 11.1 (L) 04/14/2018   HCT 32.7 (L) 04/14/2018   MCV 93.0 04/14/2018   PLT 201 04/14/2018   NEUTROABS 3.7 04/14/2018    CMP  Lab Results  Component Value Date   NA 139 04/14/2018   K 3.1 (L) 04/14/2018   CL 105 04/14/2018   CO2 25 04/14/2018   GLUCOSE 112 (H) 04/14/2018   BUN 9 04/14/2018   CREATININE 0.76 04/14/2018   CALCIUM 9.7 04/14/2018   PROT 6.4 (L) 04/14/2018   ALBUMIN 3.5 04/14/2018   AST 15 04/14/2018   ALT 15 04/14/2018   ALKPHOS 103 04/14/2018   BILITOT 0.5 04/14/2018   GFRNONAA >60 04/14/2018   GFRAA >60 04/14/2018    Lab Results  Component Value Date   CEA1 <1.00 10/21/2017    Medications: I have reviewed the patient's current medications.   Assessment/Plan: 1. 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 MSI-stable ? 3/14 lymph nodes positive for  metastatic carcinoma, lymphovascular invasion present ? Cycle 1 FOLFOX 10/22/2017 ? CTs chest, abdomen, and pelvis 10/28/2017-negative for metastatic disease ? Cycle 2 FOLFOX 11/05/2017 ? Cycle 3 FOLFOX 11/27/2017 ? Cycle 4 FOLFOX 12/10/2017 ? Cycle 5 FOLFOX 12/24/2017 ? Cycle 6 FOLFOX 01/08/2018 (Oxaliplatin held due to thrombocytopenia) ? Cycle 7 FOLFOX 01/21/2018-oxaliplatin resumed ? Cycle 8 FOLFOX 02/04/2018-Oxaliplatin eliminated due to an allergic reaction with cycle 7 ? Cycle 9 FOLFOX6/26/2019- no Oxaliplatin; 5-FU bolus eliminated, infusional 5-FU dose reduced ? Cycle 10 FOLFOX 03/17/2018 ? Cycle 11 FOLFOX 03/31/2018 ? Cycle 12 FOLFOX 04/14/2018  2.Asthma  3.Port-A-Cath placement 10/17/2017  4.History of neutropenia secondary to chemotherapy-resolved  5.History of thrombocytopenia secondary to chemotherapy  6.Allergic reaction to Oxaliplatin 01/21/2018-pruritus, tachycardia, diaphoresis, hypotension  7.Diarrhea following cycle 8 FOLFOX-resolved, 5-FU bolus eliminated and infusion dose decreased beginning with cycle 9    Dispositin: Sharon Allison appears well.  She will complete a final cycle of 5-fluorouracil today.  The intermittent pruritus and rash may be related to 5-fluorouracil.  She will continue Benadryl as needed.  The potassium is low today.  She will resume a potassium supplement.  We will check a potassium level when she returns for an office visit in 6 weeks.  Sharon Allison will return for an office visit and Port-A-Cath flush in 6 weeks.  Betsy Coder, MD  04/14/2018  11:32 AM

## 2018-04-14 NOTE — Telephone Encounter (Signed)
Scheduled appt per 8/6 los - gave patient AVS and calender per los.

## 2018-04-14 NOTE — Addendum Note (Signed)
Addended by: Jethro Bolus A on: 04/14/2018 11:57 AM   Modules accepted: Orders

## 2018-04-15 ENCOUNTER — Ambulatory Visit: Payer: BC Managed Care – PPO | Admitting: Certified Nurse Midwife

## 2018-04-16 ENCOUNTER — Inpatient Hospital Stay: Payer: BC Managed Care – PPO

## 2018-04-16 VITALS — BP 142/80 | HR 70 | Temp 98.0°F | Resp 18

## 2018-04-16 DIAGNOSIS — Z452 Encounter for adjustment and management of vascular access device: Secondary | ICD-10-CM | POA: Diagnosis not present

## 2018-04-16 DIAGNOSIS — C182 Malignant neoplasm of ascending colon: Secondary | ICD-10-CM

## 2018-04-16 MED ORDER — HEPARIN SOD (PORK) LOCK FLUSH 100 UNIT/ML IV SOLN
500.0000 [IU] | Freq: Once | INTRAVENOUS | Status: AC | PRN
  Administered 2018-04-16: 500 [IU]
  Filled 2018-04-16: qty 5

## 2018-04-16 MED ORDER — SODIUM CHLORIDE 0.9% FLUSH
10.0000 mL | INTRAVENOUS | Status: DC | PRN
  Administered 2018-04-16: 10 mL
  Filled 2018-04-16: qty 10

## 2018-04-30 ENCOUNTER — Other Ambulatory Visit: Payer: Self-pay | Admitting: Certified Nurse Midwife

## 2018-04-30 ENCOUNTER — Other Ambulatory Visit: Payer: Self-pay

## 2018-04-30 ENCOUNTER — Ambulatory Visit: Payer: BC Managed Care – PPO | Admitting: Certified Nurse Midwife

## 2018-04-30 ENCOUNTER — Encounter: Payer: Self-pay | Admitting: Certified Nurse Midwife

## 2018-04-30 ENCOUNTER — Other Ambulatory Visit (HOSPITAL_COMMUNITY)
Admission: RE | Admit: 2018-04-30 | Discharge: 2018-04-30 | Disposition: A | Discharge status: Home / Self Care | Payer: BC Managed Care – PPO | Source: Ambulatory Visit | Attending: Certified Nurse Midwife | Admitting: Certified Nurse Midwife

## 2018-04-30 VITALS — BP 124/70 | HR 68 | Resp 16 | Ht 62.25 in | Wt 214.0 lb

## 2018-04-30 DIAGNOSIS — Z85038 Personal history of other malignant neoplasm of large intestine: Secondary | ICD-10-CM

## 2018-04-30 DIAGNOSIS — Z01419 Encounter for gynecological examination (general) (routine) without abnormal findings: Secondary | ICD-10-CM | POA: Diagnosis not present

## 2018-04-30 DIAGNOSIS — Z1231 Encounter for screening mammogram for malignant neoplasm of breast: Secondary | ICD-10-CM

## 2018-04-30 DIAGNOSIS — Z124 Encounter for screening for malignant neoplasm of cervix: Secondary | ICD-10-CM | POA: Insufficient documentation

## 2018-04-30 NOTE — Progress Notes (Signed)
63 y.o. G0P0000 Divorced  African American Fe here for annual exam. Menopausal no HRT. Denies vaginal bleeding or vaginal dryness issues. Patient diagnosed with colon cancer in the last year and has one more chemotherapy treatment. Tolerating much better now and hair is beginning to grow back. Continues with oncology. 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 Harlow Asa once she is through with treatment. Calcium levels have come down now, so may have been a response to the cancer. Continues follow up with PCP, regarding asthma/aex/labs as needed. Energy returning. No gyn health issues today.  Patient's last menstrual period was 07/10/2001 (lmp unknown).          Sexually active: No.  The current method of family planning is status post hysterectomy.    Exercising: No.  exercise Smoker:  no  Review of Systems  Constitutional: Negative.   HENT: Negative.   Eyes: Negative.   Respiratory: Negative.   Cardiovascular: Negative.   Gastrointestinal: Negative.   Genitourinary: Negative.   Musculoskeletal: Negative.   Skin: Negative.   Neurological: Negative.   Endo/Heme/Allergies: Negative.   Psychiatric/Behavioral: Negative.     Health Maintenance: Pap:  11-21-04 neg History of Abnormal Pap: no MMG:  04-25-17 category b density birads 1:neg Self Breast exams: no Colonoscopy:  2018, colon cancer BMD:   2016 TDaP:  2014 Shingles: not done Pneumonia: not done Hep C and HIV: hep c neg 2017 Labs: no   reports that she has never smoked. She has never used smokeless tobacco. She reports that she drank alcohol. She reports that she does not use drugs.  Past Medical History:  Diagnosis Date  . Anemia   . Asthma   . Cancer [facility])    colon cancer  . Colon polyp   . Fibroid   . GERD (gastroesophageal reflux disease)   . Heart murmur    per patient " dr white told me i had a murmur but i told him they told me that when i was a kid and it  had never bothered me "   . Hypertension   . Sleep apnea     Past Surgical History:  Procedure Laterality Date  . ABDOMINAL HYSTERECTOMY     2002  . COLONOSCOPY N/A 09/18/2017   Procedure: COLONOSCOPY;  Surgeon: Ileana Roup, MD;  Location: [facility] ORS;  Service: General;  Laterality: N/A;  . LAPAROSCOPIC RIGHT HEMI COLECTOMY Right 09/18/2017   Procedure: LAPAROSCOPIC RIGHT HEMI COLECTOMY ERAS PATHWAY;  Surgeon: Ileana Roup, MD;  Location: [facility];  Service: General;  Laterality: Right;  . MYOMECTOMY  DONE 5 YEARS PRIOR TO HYSTERECTOMY   multiple fibroids  . PORTACATH PLACEMENT N/A 10/17/2017   Procedure: INSERTION PORT-A-CATH;  Surgeon: Ileana Roup, MD;  Location: [facility];  Service: General;  Laterality: N/A;    Current Outpatient Medications  Medication Sig Dispense Refill  . albuterol (PROVENTIL HFA;VENTOLIN HFA) 108 (90 Base) MCG/ACT inhaler Inhale 2 puffs into the lungs every 4 (four) hours as needed for wheezing or shortness of breath. (Patient taking differently: Inhale 1 puff into the lungs every 4 (four) hours as needed for wheezing or shortness of breath. ) 18 g 1  . Azelastine-Fluticasone (DYMISTA) 137-50 MCG/ACT SUSP Place 1 spray into the nose daily as needed (congestion).     . Cholecalciferol (VITAMIN D3) 1000 units CAPS Take 2,000 Units by mouth daily.     Marland Kitchen ketotifen (ZADITOR) 0.025 % ophthalmic solution Place  1 drop into both eyes daily as needed (allergies).     . lansoprazole (PREVACID) 15 MG capsule Take 15 mg by mouth daily as needed (acid reflux).     Marland Kitchen lidocaine-prilocaine (EMLA) cream Apply to portacath 1-2 hours prior to use 30 g 2  . montelukast (SINGULAIR) 10 MG tablet TAKE 1 TABLET BY MOUTH ONCE DAILY AT BEDTIME 30 tablet 1  . Polyethyl Glycol-Propyl Glycol (SYSTANE OP) Place 1 drop into both eyes daily as needed (dry eyes).     . potassium chloride SA (K-DUR,KLOR-CON) 20 MEQ tablet Take 1 tablet (20 mEq total) by mouth daily. 30 tablet 0  .  SYMBICORT 160-4.5 MCG/ACT inhaler INHALE 2 PUFFS BY MOUTH TWICE DAILY 10.2 g 1  . Multiple Vitamins-Minerals (CENTRUM SILVER PO) Take 1 tablet by mouth daily.     No current facility-administered medications for this visit.    Facility-Administered Medications Ordered in Other Visits  Medication Dose Route Frequency Provider Last Rate Last Dose  . famotidine (PEPCID) IVPB 20 mg premix  20 mg Intravenous Q12H Tanner, Lucianne Lei E., PA-C   Stopped at 01/21/18 1528  . heparin lock flush 100 unit/mL  500 Units Intracatheter Once PRN Betsy Coder B, MD      . sodium chloride flush (NS) 0.9 % injection 10 mL  10 mL Intracatheter PRN Ladell Pier, MD        Family History  Problem Relation Age of Onset  . Hypertension Mother   . Leukemia Mother   . Lupus Father   . Hodgkin's lymphoma Maternal Grandmother   . Cancer Maternal Grandfather        prostate  . Leukemia Sister   . Kidney disease Brother   . Leukemia Brother   . Allergic rhinitis Neg Hx   . Asthma Neg Hx   . Eczema Neg Hx   . Urticaria Neg Hx     ROS:  Pertinent items are noted in HPI.  Otherwise, a comprehensive ROS was negative.  Exam:   BP 124/70   Pulse 68   Resp 16   Ht 5' 2.25" (1.581 m)   Wt 214 lb (97.1 kg)   LMP 07/10/2001 (LMP Unknown)   BMI 38.83 kg/m  Height: 5' 2.25" (158.1 cm) Ht Readings from Last 3 Encounters:  04/30/18 5' 2.25" (1.581 m)  04/14/18 5\' 3"  (1.6 m)  03/31/18 5\' 3"  (1.6 m)    General appearance: alert, cooperative and appears stated age Head: Normocephalic, without obvious abnormality, atraumatic Neck: no adenopathy, supple, symmetrical, trachea midline and thyroid normal to inspection and palpation Lungs: clear to auscultation bilaterally Breasts: normal appearance, no masses or tenderness, No nipple retraction or dimpling, No nipple discharge or bleeding, No axillary or supraclavicular adenopathy Heart: regular rate and rhythm Abdomen: soft, non-tender; no masses,  no organomegaly,  healed incisions from surgery noted Extremities: extremities normal, atraumatic, no cyanosis or edema Skin: Skin color, texture, turgor normal. No rashes or lesions Lymph nodes: Cervical, supraclavicular, and axillary nodes normal. No abnormal inguinal nodes palpated Neurologic: Grossly normal   Pelvic: External genitalia:  no lesions, normal female              Urethra:  normal appearing urethra with no masses, tenderness or lesions              Bartholin's and Skene's: normal                 Vagina: normal appearing vagina with normal color and discharge, no lesions  Cervix: absent              Pap taken: Yes.   Bimanual Exam:  Uterus:  uterus absent              Adnexa: normal adnexa and no mass, fullness, tenderness               Rectovaginal: Confirms               Anus:  normal sphincter tone, no lesions  Chaperone present: yes  A:  Well Woman with normal exam  Menopausal no HRT s/p TAH for fibroids, ovaries retained  Recent colon cancer diagnosis in past year with chemotherapy, recovering well  Hyperparathyroid under evaluation at present  Mammogram due, patient plans to do after chemo is completed    P:   Reviewed health and wellness pertinent to exam  Aware to advise if vaginal bleeding or dryness issues.  Continue follow up as indicated regarding colon cancer and hyperparathyroid  Pap smear: yes   counseled on breast self exam, mammography screening, feminine hygiene, adequate intake of calcium and vitamin D, diet and exercise  return annually or prn  An After Visit Summary was printed and given to the patient.

## 2018-04-30 NOTE — Patient Instructions (Signed)

## 2018-05-01 LAB — CYTOLOGY - PAP
DIAGNOSIS: NEGATIVE
HPV: NOT DETECTED

## 2018-05-04 ENCOUNTER — Encounter: Payer: Self-pay | Admitting: *Deleted

## 2018-05-04 DIAGNOSIS — C182 Malignant neoplasm of ascending colon: Secondary | ICD-10-CM

## 2018-05-04 NOTE — Progress Notes (Signed)
6 MONTH VISIT FOR [id number] STUDY; Called patient to check on her status for the 6 month time point since enrolling on study. Patient has completed chemotherapy with curative intent and no further treatment planned at this time for her stage 3 colon cancer.She states she is still recovering from side effects such as fatigue from treatment but is able to perform all of her ADLs and instrumental ADLs by herself.  Patient is working part time from home on the weekends. RN assessed ECOG level as 1 and Dr. Benay Spice agrees. Patient denies any changes to her insurance since beginning on study.  Patient states she has received email from the study to complete questionnaire and she will do this soon.  Thanked patient for her ongoing participation in this study. I will contact her again in about 6 months for the last follow up form. Encouraged patient to contact research nurse if any questions before next contact. She verbalized understanding.  Foye Spurling, BSN, RN Clinical Research Nurse 05/04/2018 10:43 AM

## 2018-05-21 ENCOUNTER — Other Ambulatory Visit: Payer: Self-pay | Admitting: Gastroenterology

## 2018-05-26 ENCOUNTER — Encounter: Payer: Self-pay | Admitting: Nurse Practitioner

## 2018-05-26 ENCOUNTER — Telehealth: Payer: Self-pay | Admitting: Nurse Practitioner

## 2018-05-26 ENCOUNTER — Other Ambulatory Visit: Payer: Self-pay | Admitting: Emergency Medicine

## 2018-05-26 ENCOUNTER — Inpatient Hospital Stay: Payer: BC Managed Care – PPO

## 2018-05-26 ENCOUNTER — Inpatient Hospital Stay: Payer: BC Managed Care – PPO | Attending: Oncology | Admitting: Nurse Practitioner

## 2018-05-26 VITALS — BP 156/76 | HR 70 | Temp 98.5°F | Resp 18 | Ht 62.25 in | Wt 220.3 lb

## 2018-05-26 DIAGNOSIS — C180 Malignant neoplasm of cecum: Secondary | ICD-10-CM | POA: Insufficient documentation

## 2018-05-26 DIAGNOSIS — C182 Malignant neoplasm of ascending colon: Secondary | ICD-10-CM

## 2018-05-26 DIAGNOSIS — C779 Secondary and unspecified malignant neoplasm of lymph node, unspecified: Secondary | ICD-10-CM | POA: Insufficient documentation

## 2018-05-26 DIAGNOSIS — D6959 Other secondary thrombocytopenia: Secondary | ICD-10-CM | POA: Insufficient documentation

## 2018-05-26 DIAGNOSIS — Z95828 Presence of other vascular implants and grafts: Secondary | ICD-10-CM

## 2018-05-26 DIAGNOSIS — G620 Drug-induced polyneuropathy: Secondary | ICD-10-CM | POA: Insufficient documentation

## 2018-05-26 LAB — BASIC METABOLIC PANEL - CANCER CENTER ONLY
Anion gap: 10 (ref 5–15)
BUN: 10 mg/dL (ref 8–23)
CO2: 25 mmol/L (ref 22–32)
Calcium: 9.9 mg/dL (ref 8.9–10.3)
Chloride: 108 mmol/L (ref 98–111)
Creatinine: 0.7 mg/dL (ref 0.44–1.00)
GFR, Est AFR Am: >60 mL/min (ref >60)
GLUCOSE: 122 mg/dL — AB (ref 70–99)
POTASSIUM: 3.5 mmol/L (ref 3.5–5.1)
Sodium: 143 mmol/L (ref 135–145)

## 2018-05-26 MED ORDER — SODIUM CHLORIDE 0.9% FLUSH
10.0000 mL | Freq: Once | INTRAVENOUS | Status: AC
  Administered 2018-05-26: 10 mL
  Filled 2018-05-26: qty 10

## 2018-05-26 MED ORDER — HEPARIN SOD (PORK) LOCK FLUSH 100 UNIT/ML IV SOLN
500.0000 [IU] | Freq: Once | INTRAVENOUS | Status: AC
  Administered 2018-05-26: 500 [IU]
  Filled 2018-05-26: qty 5

## 2018-05-26 MED ORDER — PREDNISONE 50 MG PO TABS: ORAL_TABLET | ORAL | 0 refills | Status: DC

## 2018-05-26 NOTE — Telephone Encounter (Signed)
Scheduled appt per 9/17 los - gave patient AVS and calender per los,. Central radiology to contact patient with ct scan,.

## 2018-05-26 NOTE — Progress Notes (Signed)
  [facility] OFFICE PROGRESS NOTE   Diagnosis: Colon cancer  INTERVAL HISTORY:   Sharon Allison returns as scheduled.  She completed the 12th and final cycle of FOLFOX 04/14/2018.  Oxaliplatin was eliminated from the regimen beginning with cycle 8 due to an allergic reaction.  She overall feels well.  She notes numbness in the toes and feet.  The numbness seems worse at nighttime.  There is no associated pain.  No balance issues.  No numbness or tingling in her hands or feet.  She reports a good appetite.  She denies pain.  No nausea or vomiting.  No change in bowel habits.  Objective:  Vital signs in last 24 hours:  Blood pressure (!) 156/76, pulse 70, temperature 98.5 F (36.9 C), temperature source Oral, resp. rate 18, height 5' 2.25" (1.581 m), weight 220 lb 4.8 oz (99.9 kg), last menstrual period 07/10/2001, SpO2 99 %.    HEENT: Neck without mass. Lymphatics: No palpable cervical, supraclavicular, axillary or inguinal lymph nodes. Resp: Lungs clear bilaterally. Cardio: Regular rate and rhythm. GI: Abdomen soft and nontender.  No hepatomegaly. Vascular: No leg edema.  Port-A-Cath without erythema.  Lab Results:  Lab Results  Component Value Date   WBC 6.0 04/14/2018   HGB 11.1 (L) 04/14/2018   HCT 32.7 (L) 04/14/2018   MCV 93.0 04/14/2018   PLT 201 04/14/2018   NEUTROABS 3.7 04/14/2018    Imaging:  No results found.  Medications: I have reviewed the patient's current medications.  Assessment/Plan: 1. 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 MSI-stable ? 3/14 lymph nodes positive for metastatic carcinoma, lymphovascular invasion present ? Cycle 1 FOLFOX 10/22/2017 ? CTs chest, abdomen, and pelvis 10/28/2017-negative for metastatic disease ? Cycle 2 FOLFOX 11/05/2017 ? Cycle 3 FOLFOX 11/27/2017 ? Cycle 4 FOLFOX 12/10/2017 ? Cycle 5 FOLFOX 12/24/2017 ? Cycle 6 FOLFOX 01/08/2018 (Oxaliplatin held due to  thrombocytopenia) ? Cycle 7 FOLFOX 01/21/2018-oxaliplatin resumed ? Cycle 8 FOLFOX 02/04/2018-Oxaliplatin eliminated due to an allergic reaction with cycle 7 ? Cycle 9 FOLFOX6/26/2019-no Oxaliplatin; 5-FU bolus eliminated, infusional 5-FU dose reduced ? Cycle 10 FOLFOX 03/17/2018 ? Cycle 11 FOLFOX 03/31/2018 ? Cycle 12 FOLFOX 04/14/2018  2.Asthma  3.Port-A-Cath placement 10/17/2017  4.History of neutropenia secondary to chemotherapy-resolved  5.History of thrombocytopenia secondary to chemotherapy  6.Allergic reaction to Oxaliplatin 01/21/2018-pruritus, tachycardia, diaphoresis, hypotension  7.Diarrhea following cycle 8 FOLFOX-resolved, 5-FU bolus eliminated and infusion dose decreased beginning with cycle 9   Disposition: Sharon Allison appears stable.  She is approximately 6 weeks out from completing the course of adjuvant chemotherapy.  She has residual numbness in the feet.  This does not interfere with activity.  She reports she is scheduled for a colonoscopy next month.  She will return for a Port-A-Cath flush in 6 weeks.  In 3 months we will obtain a CEA and surveillance CT scans. She will return for a follow-up visit a few days after the lab and CTs to review the results.   Plan reviewed with Dr. Benay Spice.  Ned Card ANP/GNP-BC   05/26/2018  10:17 AM

## 2018-05-27 ENCOUNTER — Ambulatory Visit
Admission: RE | Admit: 2018-05-27 | Discharge: 2018-05-27 | Disposition: A | Discharge status: Home / Self Care | Payer: BC Managed Care – PPO | Source: Ambulatory Visit | Attending: Certified Nurse Midwife | Admitting: Certified Nurse Midwife

## 2018-05-27 DIAGNOSIS — Z1231 Encounter for screening mammogram for malignant neoplasm of breast: Secondary | ICD-10-CM

## 2018-05-29 ENCOUNTER — Other Ambulatory Visit: Payer: Self-pay | Admitting: Surgery

## 2018-05-29 DIAGNOSIS — E041 Nontoxic single thyroid nodule: Secondary | ICD-10-CM

## 2018-05-29 DIAGNOSIS — E210 Primary hyperparathyroidism: Secondary | ICD-10-CM

## 2018-06-03 ENCOUNTER — Ambulatory Visit
Admission: RE | Admit: 2018-06-03 | Discharge: 2018-06-03 | Disposition: A | Discharge status: Home / Self Care | Payer: BC Managed Care – PPO | Source: Ambulatory Visit | Attending: Surgery | Admitting: Surgery

## 2018-06-03 DIAGNOSIS — E041 Nontoxic single thyroid nodule: Secondary | ICD-10-CM

## 2018-06-03 DIAGNOSIS — E210 Primary hyperparathyroidism: Secondary | ICD-10-CM

## 2018-06-24 NOTE — Progress Notes (Signed)
Pt called requesting when she comes in to have a colonoscopy on 06/30/2018 that she would like her port accessed as the IV site. I told the patient that we would try to do that and we would call the IV team when she got here.

## 2018-06-29 NOTE — Anesthesia Preprocedure Evaluation (Addendum)
Anesthesia Evaluation  Patient identified by MRN, date of birth, ID band Patient awake    Reviewed: Allergy & Precautions, NPO status , Patient's Chart, lab work & pertinent test results  Airway Mallampati: II  TM Distance: >3 FB Neck ROM: Full    Dental no notable dental hx. (+) Teeth Intact, Dental Advisory Given   Pulmonary asthma , sleep apnea and Continuous Positive Airway Pressure Ventilation ,    Pulmonary exam normal breath sounds clear to auscultation       Cardiovascular hypertension, Normal cardiovascular exam Rhythm:Regular Rate:Normal     Neuro/Psych negative neurological ROS  negative psych ROS   GI/Hepatic Neg liver ROS, GERD  Medicated,  Endo/Other  negative endocrine ROS  Renal/GU negative Renal ROS     Musculoskeletal negative musculoskeletal ROS (+)   Abdominal   Peds  Hematology negative hematology ROS (+)   Anesthesia Other Findings H/o colon CA  Reproductive/Obstetrics                           Anesthesia Physical Anesthesia Plan  ASA: III  Anesthesia Plan: MAC   Post-op Pain Management:    Induction: Intravenous  PONV Risk Score and Plan: 2 and Treatment may vary due to age or medical condition and Propofol infusion  Airway Management Planned: Natural Airway and Simple Face Mask  Additional Equipment:   Intra-op Plan:   Post-operative Plan:   Informed Consent: I have reviewed the patients History and Physical, chart, labs and discussed the procedure including the risks, benefits and alternatives for the proposed anesthesia with the patient or authorized representative who has indicated his/her understanding and acceptance.   Dental advisory given  Plan Discussed with: CRNA  Anesthesia Plan Comments:        Anesthesia Quick Evaluation

## 2018-06-30 ENCOUNTER — Encounter (HOSPITAL_COMMUNITY): Payer: Self-pay | Admitting: Certified Registered"

## 2018-06-30 ENCOUNTER — Ambulatory Visit (HOSPITAL_COMMUNITY)
Admission: RE | Admit: 2018-06-30 | Discharge: 2018-06-30 | Disposition: A | Discharge status: Home / Self Care | Payer: BC Managed Care – PPO | Source: Ambulatory Visit | Attending: Gastroenterology | Admitting: Gastroenterology

## 2018-06-30 ENCOUNTER — Encounter (HOSPITAL_COMMUNITY)
Admission: RE | Disposition: A | Discharge status: Home / Self Care | Payer: Self-pay | Source: Ambulatory Visit | Attending: Gastroenterology

## 2018-06-30 ENCOUNTER — Ambulatory Visit (HOSPITAL_COMMUNITY): Payer: BC Managed Care – PPO | Admitting: Anesthesiology

## 2018-06-30 ENCOUNTER — Other Ambulatory Visit: Payer: Self-pay

## 2018-06-30 DIAGNOSIS — Z8249 Family history of ischemic heart disease and other diseases of the circulatory system: Secondary | ICD-10-CM | POA: Insufficient documentation

## 2018-06-30 DIAGNOSIS — Z1211 Encounter for screening for malignant neoplasm of colon: Secondary | ICD-10-CM | POA: Insufficient documentation

## 2018-06-30 DIAGNOSIS — D649 Anemia, unspecified: Secondary | ICD-10-CM | POA: Diagnosis not present

## 2018-06-30 DIAGNOSIS — J45909 Unspecified asthma, uncomplicated: Secondary | ICD-10-CM | POA: Diagnosis not present

## 2018-06-30 DIAGNOSIS — Z79899 Other long term (current) drug therapy: Secondary | ICD-10-CM | POA: Insufficient documentation

## 2018-06-30 DIAGNOSIS — Z980 Intestinal bypass and anastomosis status: Secondary | ICD-10-CM | POA: Insufficient documentation

## 2018-06-30 DIAGNOSIS — Z806 Family history of leukemia: Secondary | ICD-10-CM | POA: Diagnosis not present

## 2018-06-30 DIAGNOSIS — D123 Benign neoplasm of transverse colon: Secondary | ICD-10-CM | POA: Insufficient documentation

## 2018-06-30 DIAGNOSIS — Z888 Allergy status to other drugs, medicaments and biological substances status: Secondary | ICD-10-CM | POA: Insufficient documentation

## 2018-06-30 DIAGNOSIS — R011 Cardiac murmur, unspecified: Secondary | ICD-10-CM | POA: Insufficient documentation

## 2018-06-30 DIAGNOSIS — Z882 Allergy status to sulfonamides status: Secondary | ICD-10-CM | POA: Insufficient documentation

## 2018-06-30 DIAGNOSIS — Z86010 Personal history of colonic polyps: Secondary | ICD-10-CM | POA: Insufficient documentation

## 2018-06-30 DIAGNOSIS — Z841 Family history of disorders of kidney and ureter: Secondary | ICD-10-CM | POA: Diagnosis not present

## 2018-06-30 DIAGNOSIS — Z90710 Acquired absence of both cervix and uterus: Secondary | ICD-10-CM | POA: Insufficient documentation

## 2018-06-30 DIAGNOSIS — I10 Essential (primary) hypertension: Secondary | ICD-10-CM | POA: Diagnosis not present

## 2018-06-30 DIAGNOSIS — K219 Gastro-esophageal reflux disease without esophagitis: Secondary | ICD-10-CM | POA: Diagnosis not present

## 2018-06-30 DIAGNOSIS — Z8489 Family history of other specified conditions: Secondary | ICD-10-CM | POA: Insufficient documentation

## 2018-06-30 DIAGNOSIS — Z7951 Long term (current) use of inhaled steroids: Secondary | ICD-10-CM | POA: Insufficient documentation

## 2018-06-30 DIAGNOSIS — G4730 Sleep apnea, unspecified: Secondary | ICD-10-CM | POA: Diagnosis not present

## 2018-06-30 DIAGNOSIS — Z807 Family history of other malignant neoplasms of lymphoid, hematopoietic and related tissues: Secondary | ICD-10-CM | POA: Insufficient documentation

## 2018-06-30 DIAGNOSIS — Z85038 Personal history of other malignant neoplasm of large intestine: Secondary | ICD-10-CM | POA: Diagnosis not present

## 2018-06-30 DIAGNOSIS — Z91041 Radiographic dye allergy status: Secondary | ICD-10-CM | POA: Insufficient documentation

## 2018-06-30 DIAGNOSIS — Z8042 Family history of malignant neoplasm of prostate: Secondary | ICD-10-CM | POA: Diagnosis not present

## 2018-06-30 HISTORY — PX: POLYPECTOMY: SHX5525

## 2018-06-30 HISTORY — PX: COLONOSCOPY WITH PROPOFOL: SHX5780

## 2018-06-30 SURGERY — COLONOSCOPY WITH PROPOFOL
Anesthesia: Monitor Anesthesia Care

## 2018-06-30 MED ORDER — SODIUM CHLORIDE 0.9 % IV SOLN: INTRAVENOUS | Status: DC

## 2018-06-30 MED ORDER — PROPOFOL 10 MG/ML IV BOLUS
INTRAVENOUS | Status: AC
  Filled 2018-06-30: qty 40

## 2018-06-30 MED ORDER — LACTATED RINGERS IV SOLN
INTRAVENOUS | Status: AC | PRN
  Administered 2018-06-30: 1000 mL via INTRAVENOUS

## 2018-06-30 MED ORDER — HEPARIN SOD (PORK) LOCK FLUSH 100 UNIT/ML IV SOLN
INTRAVENOUS | Status: AC
  Filled 2018-06-30: qty 5

## 2018-06-30 MED ORDER — LIDOCAINE 2% (20 MG/ML) 5 ML SYRINGE
INTRAMUSCULAR | Status: DC | PRN
  Administered 2018-06-30: 40 mg via INTRAVENOUS

## 2018-06-30 MED ORDER — HEPARIN SOD (PORK) LOCK FLUSH 100 UNIT/ML IV SOLN
500.0000 [IU] | Freq: Once | INTRAVENOUS | Status: AC
  Administered 2018-06-30: 500 [IU]

## 2018-06-30 MED ORDER — PROPOFOL 10 MG/ML IV BOLUS
INTRAVENOUS | Status: DC | PRN
  Administered 2018-06-30 (×2): 40 mg via INTRAVENOUS
  Administered 2018-06-30: 20 mg via INTRAVENOUS
  Administered 2018-06-30 (×3): 40 mg via INTRAVENOUS

## 2018-06-30 SURGICAL SUPPLY — 22 items

## 2018-06-30 NOTE — Anesthesia Procedure Notes (Signed)
Procedure Name: MAC Date/Time: 06/30/2018 7:35 AM Performed by: Cynda Familia, CRNA Pre-anesthesia Checklist: Patient identified, Emergency Drugs available, Suction available, Patient being monitored and Timeout performed Patient Re-evaluated:Patient Re-evaluated prior to induction Oxygen Delivery Method: Simple face mask Placement Confirmation: positive ETCO2 and breath sounds checked- equal and bilateral Dental Injury: Teeth and Oropharynx as per pre-operative assessment

## 2018-06-30 NOTE — Discharge Instructions (Signed)

## 2018-06-30 NOTE — Transfer of Care (Signed)
Immediate Anesthesia Transfer of Care Note  Patient: Sharon Allison  Procedure(s) Performed: COLONOSCOPY WITH PROPOFOL (N/A ) POLYPECTOMY  Patient Location: PACU  Anesthesia Type:MAC  Level of Consciousness: sedated  Airway & Oxygen Therapy: Patient Spontanous Breathing and Patient connected to face mask oxygen  Post-op Assessment: Report given to RN and Post -op Vital signs reviewed and stable  Post vital signs: Reviewed and stable  Last Vitals:  Vitals Value Taken Time  BP    Temp    Pulse 68 06/30/2018  8:02 AM  Resp 17 06/30/2018  8:02 AM  SpO2 100 % 06/30/2018  8:02 AM  Vitals shown include unvalidated device data.  Last Pain:  Vitals:   06/30/18 0655  TempSrc: Oral  PainSc: 0-No pain         Complications: No apparent anesthesia complications

## 2018-06-30 NOTE — Anesthesia Postprocedure Evaluation (Signed)
Anesthesia Post Note  Patient: ATIANA LEVIER  Procedure(s) Performed: COLONOSCOPY WITH PROPOFOL (N/A ) POLYPECTOMY     Patient location during evaluation: Endoscopy Anesthesia Type: MAC Level of consciousness: awake and alert Pain management: pain level controlled Vital Signs Assessment: post-procedure vital signs reviewed and stable Respiratory status: spontaneous breathing, nonlabored ventilation, respiratory function stable and patient connected to nasal cannula oxygen Cardiovascular status: blood pressure returned to baseline and stable Postop Assessment: no apparent nausea or vomiting Anesthetic complications: no    Last Vitals:  Vitals:   06/30/18 0810 06/30/18 0820  BP: 137/67 (!) 154/84  Pulse: 71 78  Resp: 19 (!) 27  Temp:    SpO2: 100% 100%    Last Pain:  Vitals:   06/30/18 0820  TempSrc:   PainSc: 0-No pain                 Kaycee Haycraft L Taleigha Pinson

## 2018-06-30 NOTE — H&P (Signed)
Sharon Allison is an 62 y.o. female.   Chief Complaint: Colorectal cancer screening. HPI: 62 year old black female here for a screening colonoscopy. She has severe sleep apnea with an AHI of 95.8. See office notes for details.   Past Medical History:  Diagnosis Date  . Anemia   . Asthma   . Cancer [facility])    colon cancer  . Colon polyp   . Fibroid   . GERD (gastroesophageal reflux disease)   . Heart murmur    per patient " dr white told me i had a murmur but i told him they told me that when i was a kid and it had never bothered me "   . Hypertension   . Sleep apnea-AHI 95.8    Past Surgical History:  Procedure Laterality Date  . ABDOMINAL HYSTERECTOMY     2002  . COLONOSCOPY N/A 09/18/2017   Procedure: COLONOSCOPY;  Surgeon: Ileana Roup, MD;  Location: [facility]S;  Service: General;  Laterality: N/A;  . LAPAROSCOPIC RIGHT HEMI COLECTOMY Right 09/18/2017   Procedure: LAPAROSCOPIC RIGHT HEMI COLECTOMY ERAS PATHWAY;  Surgeon: Ileana Roup, MD;  Location: [facility];  Service: General;  Laterality: Right;  . MYOMECTOMY  DONE 5 YEARS PRIOR TO HYSTERECTOMY   multiple fibroids  . PORTACATH PLACEMENT N/A 10/17/2017   Procedure: INSERTION PORT-A-CATH;  Surgeon: Ileana Roup, MD;  Location: [facility];  Service: General;  Laterality: N/A;   Family History  Problem Relation Age of Onset  . Hypertension Mother   . Leukemia Mother   . Lupus Father   . Hodgkin's lymphoma Maternal Grandmother   . Cancer Maternal Grandfather        prostate  . Leukemia Sister   . Kidney disease Brother   . Leukemia Brother   . Allergic rhinitis Neg Hx   . Asthma Neg Hx   . Eczema Neg Hx   . Urticaria Neg Hx    Social History:  reports that she has never smoked. She has never used smokeless tobacco. She reports that she drank alcohol. She reports that she does not use drugs.  Allergies:  Allergies  Allergen Reactions  . Oxaliplatin Shortness Of Breath    Sweating   . Iohexol Swelling   Patient had swelling and itching to face in 1980's. Had 13 hour pre meds before scan 10/27/17  . Iodine Swelling  . Sulfa Antibiotics Itching   Medications Prior to Admission  Medication Sig Dispense Refill  . Cholecalciferol (VITAMIN D3) 1000 units CAPS Take 1,000 Units by mouth daily.     . diphenhydrAMINE (BENADRYL) 25 MG tablet Take 50 mg by mouth daily as needed for itching.    . lansoprazole (PREVACID) 15 MG capsule Take 15 mg by mouth daily as needed (acid reflux).     Marland Kitchen lidocaine-prilocaine (EMLA) cream Apply to portacath 1-2 hours prior to use 30 g 2  . Propylene Glycol (SYSTANE BALANCE) 0.6 % SOLN Place 1 drop into both eyes daily as needed (dry eyes).    . SYMBICORT 160-4.5 MCG/ACT inhaler INHALE 2 PUFFS BY MOUTH TWICE DAILY 10.2 g 1  . albuterol (PROVENTIL HFA;VENTOLIN HFA) 108 (90 Base) MCG/ACT inhaler Inhale 2 puffs into the lungs every 4 (four) hours as needed for wheezing or shortness of breath. (Patient taking differently: Inhale 1 puff into the lungs every 4 (four) hours as needed for wheezing or shortness of breath. ) 18 g 1  . ketotifen (ZADITOR) 0.025 % ophthalmic solution Place 1  drop into both eyes daily as needed (allergies).     . montelukast (SINGULAIR) 10 MG tablet TAKE 1 TABLET BY MOUTH ONCE DAILY AT BEDTIME 30 tablet 1  . potassium chloride SA (K-DUR,KLOR-CON) 20 MEQ tablet Take 1 tablet (20 mEq total) by mouth daily. (Patient not taking: Reported on 06/24/2018) 30 tablet 0  . predniSONE (DELTASONE) 50 MG tablet Take 1 tablet 13 hours, 7 hours, and 1 hour before CT scan (Patient not taking: Reported on 06/24/2018) 3 tablet 0  No results found.  Review of Systems  Constitutional: Negative.   HENT: Negative.   Eyes: Negative.   Respiratory: Negative.   Cardiovascular: Negative.   Gastrointestinal: Positive for heartburn.  Genitourinary: Negative.   Skin: Negative.   Neurological: Negative.   Psychiatric/Behavioral: Positive for depression.   Blood pressure (!)  173/81, temperature 98.3 F (36.8 C), temperature source Oral, resp. rate 18, height 5\' 3"  (1.6 m), weight 99.3 kg, last menstrual period 07/10/2001, SpO2 99 %. Physical Exam  Constitutional: She is oriented to person, place, and time. She appears well-nourished.  Morbidly obese  HENT:  Head: Atraumatic.  Eyes: Conjunctivae and EOM are normal.  Neck: Normal range of motion. Neck supple.  Cardiovascular: Normal rate and regular rhythm.  Respiratory: Effort normal and breath sounds normal.  GI: Soft. Bowel sounds are normal.  Morbidly obese  Musculoskeletal: Normal range of motion.  Neurological: She is alert and oriented to person, place, and time.  Skin: Skin is warm and dry.  Psychiatric: She has a normal mood and affect. Her behavior is normal. Judgment and thought content normal.    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  Angella Montas, MD 06/30/2018, 7:24 AM

## 2018-06-30 NOTE — Op Note (Signed)
[facility] Patient Name: Alberta Lenhard Procedure Date: 06/30/2018 MRN: [id number] Attending MD: Juanita Craver , MD Date of Birth: 06-09-1956 CSN: [id number] Age: 62 Admit Type: Inpatient Procedure:                Colonoscopy with cold snare polypectomy x 1. Indications:              CRC screening for colorectal malignant neoplasm;                            Personal history of colon cancer in a TVA. Providers:                Juanita Craver, MD, Cleda Daub, RN, Alan Mulder,                            Technician, Glenis Smoker, CRNA Referring MD:             Melvia Heaps, NP & Sharon Mt. White MD Medicines:                Monitored Anesthesia Care Complications:            No immediate complications. Estimated Blood Loss:     Estimated blood loss was minimal. Procedure:                Pre-anesthesia assessment: - Prior to the                            procedure, a history and physical was performed,                            and patient medications and allergies were                            reviewed. [name] tolerance of previous                            anesthesia was also reviewed. The risks and                            benefits of the procedure and the sedation options                            and risks were discussed with the patient. All                            questions were answered, and informed consent was                            obtained. Prior anticoagulants: The patient has                            taken no previous anticoagulant or antiplatelet                            agents. ASA Grade Assessment: III - A patient with  severe systemic disease. After reviewing the risks                            and benefits, the patient was deemed in                            satisfactory condition to undergo the procedure.                            After obtaining informed consent, the colonoscope                   was passed under direct vision. Throughout the                            procedure, the patient's blood pressure, pulse, and                            oxygen saturations were monitored continuously. The                            CF-HQ190L ([id number]) Olympus adult colonoscope was                            introduced through the anus and advanced to the the                            ileocolonic anastomosis. The colonoscopy was                            performed without difficulty. The patient tolerated                            the procedure well. The quality of the bowel                            preparation was adequate. The terminal ileum and                            the rectum were photographed. The bowel preparation                            used was GoLYTELY. Scope In: 7:39:00 AM Scope Out: 7:54:21 AM Total Procedure Duration: 0 hours 15 minutes 21 seconds  Findings:      A 6 mm sessile polyp was found in the proximal transverse colon; the       polyp was removed with a cold snare x 1; resection and retrieval were       complete.      There was evidence of a prior end-to-end ileo-colonic anastomosis;       patient has had a right hemicolectomy.      The exam was otherwise without abnormality on direct and retroflexion       views. Impression:               - One 6 mm sessile polyp at the  proximal transverse                            colon, removed with a cold snare x 1; resected and                            retrieved.                           - Patent end-to-end ileo-colonic anastomosis;                            patient is s/p right hemicolectomy.                           - The examination was otherwise normal on direct                            and retroflexion views. Moderate Sedation:      MAC used. Recommendation:           - High fiber, loe fat diet with augmented water                            consumption daily.                            - Continue present medications.                           - Await pathology results.                           - Repeat colonoscopy in 3 years for surveillance.                           - Return to GI office PRN.                           - If the patient has any abnormal GI symptoms in                            the interim, she has been advised to call the                            office ASAP for further recommendations. Procedure Code(s):        --- Professional ---                           [phone], Colonoscopy, flexible; with removal of                            tumor(s), polyp(s), or other lesion(s) by snare                            technique Diagnosis Code(s):        --- Professional ---  Z12.11, Encounter for screening for malignant                            neoplasm of colon                           Z85.038, Personal history of other malignant                            neoplasm of large intestine                           Z86.010, Personal history of colonic polyps                           D12.3, Benign neoplasm of transverse colon (hepatic                            flexure or splenic flexure) CPT copyright 2018 American Medical Association. All rights reserved. The codes documented in this report are preliminary and upon coder review may  be revised to meet current compliance requirements. Juanita Craver, MD Juanita Craver, MD 06/30/2018 8:13:41 AM This report has been signed electronically. Number of Addenda: 0

## 2018-07-01 ENCOUNTER — Ambulatory Visit: Payer: BC Managed Care – PPO | Admitting: Allergy

## 2018-07-01 ENCOUNTER — Encounter: Payer: Self-pay | Admitting: Allergy

## 2018-07-01 VITALS — BP 122/72 | HR 88 | Resp 16

## 2018-07-01 DIAGNOSIS — J4520 Mild intermittent asthma, uncomplicated: Secondary | ICD-10-CM | POA: Diagnosis not present

## 2018-07-01 DIAGNOSIS — H1013 Acute atopic conjunctivitis, bilateral: Secondary | ICD-10-CM

## 2018-07-01 DIAGNOSIS — G4733 Obstructive sleep apnea (adult) (pediatric): Secondary | ICD-10-CM | POA: Diagnosis not present

## 2018-07-01 DIAGNOSIS — J3089 Other allergic rhinitis: Secondary | ICD-10-CM

## 2018-07-01 MED ORDER — AZELASTINE HCL 0.1 % NA SOLN: 1.0000 | Freq: Two times a day (BID) | NASAL | 5 refills | Status: DC

## 2018-07-01 MED ORDER — ALBUTEROL SULFATE HFA 108 (90 BASE) MCG/ACT IN AERS: 2.0000 | INHALATION_SPRAY | RESPIRATORY_TRACT | 1 refills | Status: DC | PRN

## 2018-07-01 MED ORDER — LEVOCETIRIZINE DIHYDROCHLORIDE 5 MG PO TABS: 5.0000 mg | ORAL_TABLET | Freq: Every evening | ORAL | 5 refills | Status: DC

## 2018-07-01 MED ORDER — MONTELUKAST SODIUM 10 MG PO TABS: 10.0000 mg | ORAL_TABLET | Freq: Every day | ORAL | 5 refills | Status: DC

## 2018-07-01 MED ORDER — BUDESONIDE-FORMOTEROL FUMARATE 160-4.5 MCG/ACT IN AERO: 2.0000 | INHALATION_SPRAY | Freq: Two times a day (BID) | RESPIRATORY_TRACT | 5 refills | Status: DC

## 2018-07-01 MED ORDER — FLUTICASONE PROPIONATE 50 MCG/ACT NA SUSP: 2.0000 | Freq: Every day | NASAL | 5 refills | Status: DC

## 2018-07-01 NOTE — Progress Notes (Signed)
Follow-up Note  RE: Sharon Allison MRN: [id number] DOB: 1956/06/21 Date of Office Visit: 07/01/2018   History of present illness: Sharon Allison is a 62 y.o. female presenting today for follow-up of asthma, allergic rhinoconjunctivitis and OSA.  She as last seen in the office on 09/10/17 by msyelf.  After this visit she has surgery for colonic polyp removal which was found to be cancerous and she reports undergoing segmental colon removal and states was told all the cancer was removed.  She did undergo 6 months or chemotherapy which she has completed and will continue follow-up with oncology and GI.  She still currently has port in place.   In regards to her asthma she states she has been doing well and only reports symptoms if she has to do activity like walking up flights of stairs which she attributes to deconditioning.  She denies need for ED/UC visits or oral steroid needs for her asthma.  She states she just ran out of symbicort and singulair in the past few days.     With her allergies she also ran out of xyzal which she feels helps.  She did complete use of the dymista sample provided at last visit and feels this spray was helpful in controlling nasal congestion and drainage.  She also has zaditor for ocular symptoms relief.     She was able to see sleep medicine who is now following her for OSA and she is on CPAP nightly without issue.    Review of systems: Review of Systems  Constitutional: Negative for chills, fever and malaise/fatigue.  HENT: Negative for congestion, ear discharge, nosebleeds and sore throat.   Eyes: Negative for pain, discharge and redness.  Respiratory: Negative for cough, shortness of breath and wheezing.   Cardiovascular: Negative for chest pain.  Gastrointestinal: Negative for abdominal pain, constipation, diarrhea, nausea and vomiting.  Musculoskeletal: Negative for joint pain.  Skin: Negative for itching and rash.  Neurological: Negative for headaches.     All other systems negative unless noted above in HPI  Past medical/social/surgical/family history have been reviewed and are unchanged unless specifically indicated below.  No changes  Medication List: Allergies as of 07/01/2018      Reactions   Oxaliplatin Shortness Of Breath   Sweating    Iohexol Swelling   Patient had swelling and itching to face in 1980's. Had 13 hour pre meds before scan 10/27/17   Iodine Swelling   Sulfa Antibiotics Itching      Medication List        Accurate as of 07/01/18  5:27 PM. Always use your most recent med list.          albuterol 108 (90 Base) MCG/ACT inhaler Commonly known as:  PROVENTIL HFA;VENTOLIN HFA Inhale 2 puffs into the lungs every 4 (four) hours as needed for wheezing or shortness of breath.   diphenhydrAMINE 25 MG tablet Commonly known as:  BENADRYL Take 50 mg by mouth daily as needed for itching.   ketotifen 0.025 % ophthalmic solution Commonly known as:  ZADITOR Place 1 drop into both eyes daily as needed (allergies).   lansoprazole 15 MG capsule Commonly known as:  PREVACID Take 15 mg by mouth daily as needed (acid reflux).   lidocaine-prilocaine cream Commonly known as:  EMLA Apply to portacath 1-2 hours prior to use   montelukast 10 MG tablet Commonly known as:  SINGULAIR TAKE 1 TABLET BY MOUTH ONCE DAILY AT BEDTIME   SYMBICORT 160-4.5 MCG/ACT inhaler  Generic drug:  budesonide-formoterol INHALE 2 PUFFS BY MOUTH TWICE DAILY   SYSTANE BALANCE 0.6 % Soln Generic drug:  Propylene Glycol Place 1 drop into both eyes daily as needed (dry eyes).   Vitamin D3 1000 units Caps Take 1,000 Units by mouth daily.       Known medication allergies: Allergies  Allergen Reactions  . Oxaliplatin Shortness Of Breath    Sweating   . Iohexol Swelling    Patient had swelling and itching to face in 1980's. Had 13 hour pre meds before scan 10/27/17  . Iodine Swelling  . Sulfa Antibiotics Itching     Physical  examination: Blood pressure 122/72, pulse 88, resp. rate 16, last menstrual period 07/10/2001.  General: Alert, interactive, in no acute distress. HEENT: PERRLA, TMs pearly gray, turbinates minimally edematous without discharge, post-pharynx non erythematous. Neck: Supple without lymphadenopathy. Lungs: Clear to auscultation without wheezing, rhonchi or rales. {no increased work of breathing. CV: Normal S1, S2 without murmurs. Abdomen: Nondistended, nontender. Skin: Warm and dry, without lesions or rashes. Extremities:  No clubbing, cyanosis or edema. Neuro:   Grossly intact.  Diagnositics/Labs:  Spirometry: FEV1: 0.99L 51%, FVC: 1.04% 42%  She did not meet ATS criteris and effort was poor and only had 2 attempts  Assessment and plan:   Asthma, mild intermittent   - continue Symbicort 160 g 2 puffs twice a day    - continue Singulair 10 mg daily   - have access to albuterol inhaler 2 puffs every 4-6 hours as needed for cough/wheeze/shortness of breath/chest tightness.  May use 15-20 minutes prior to activity.   Monitor frequency of use.    Asthma control goals:   Full participation in all desired activities (may need albuterol before activity)  Albuterol use two time or less a week on average (not counting use with activity)  Cough interfering with sleep two time or less a month  Oral steroids no more than once a year  No hospitalizations  Allergic rhinoconjunctivitis  - As above continue Singulair  - Continue Zaditor 1 drop each eye as needed for itchy, watery, red eyes.  Use your other eye drops for dry eye as recommended by your eye doctor  - Will prescribe Astelin separately.  Use 1-2 spray each nostril up to twice a day for nasal drainage   - Flonase 2 sprays each nostril for nasal congestion/drainage  - continue Xyzal 5mg  daily   Obstructive sleep apnea  - continue use of CPAP and follow-up with sleep medicine  Follow-up 6 months or sooner if needed  I  appreciate the opportunity to take part in Sharon Allison's care. Please do not hesitate to contact me with questions.  Sincerely,   Prudy Feeler, MD Allergy/Immunology [facility] [place] [facility]

## 2018-07-01 NOTE — Patient Instructions (Addendum)
Asthma, mild intermittent   - continue Symbicort 160 g 2 puffs twice a day    - continue Singulair 10 mg daily   - have access to albuterol inhaler 2 puffs every 4-6 hours as needed for cough/wheeze/shortness of breath/chest tightness.  May use 15-20 minutes prior to activity.   Monitor frequency of use.    Asthma control goals:   Full participation in all desired activities (may need albuterol before activity)  Albuterol use two time or less a week on average (not counting use with activity)  Cough interfering with sleep two time or less a month  Oral steroids no more than once a year  No hospitalizations  Allergic rhinoconjunctivitis  - As above continue Singulair  - Continue Zaditor 1 drop each eye as needed for itchy, watery, red eyes.  Use your other eye drops for dry eye as recommended by your eye doctor  - Will prescribe Astelin separately.  Use 1-2 spray each nostril up to twice a day for nasal drainage   - Flonase 2 sprays each nostril for nasal congestion/drainage  - continue Xyzal 5mg  daily   Obstructive sleep apnea  - continue use of CPAP and follow-up with sleep medicine   Will refill medications today  Follow-up 6 months or sooner if needed

## 2018-07-07 ENCOUNTER — Inpatient Hospital Stay: Payer: BC Managed Care – PPO | Attending: Oncology

## 2018-07-07 DIAGNOSIS — C180 Malignant neoplasm of cecum: Secondary | ICD-10-CM | POA: Insufficient documentation

## 2018-07-07 DIAGNOSIS — C182 Malignant neoplasm of ascending colon: Secondary | ICD-10-CM

## 2018-07-07 DIAGNOSIS — Z95828 Presence of other vascular implants and grafts: Secondary | ICD-10-CM

## 2018-07-07 DIAGNOSIS — Z452 Encounter for adjustment and management of vascular access device: Secondary | ICD-10-CM | POA: Insufficient documentation

## 2018-07-07 MED ORDER — SODIUM CHLORIDE 0.9% FLUSH
10.0000 mL | Freq: Once | INTRAVENOUS | Status: AC
  Administered 2018-07-07: 10 mL
  Filled 2018-07-07: qty 10

## 2018-07-07 MED ORDER — HEPARIN SOD (PORK) LOCK FLUSH 100 UNIT/ML IV SOLN
500.0000 [IU] | Freq: Once | INTRAVENOUS | Status: AC
  Administered 2018-07-07: 500 [IU]
  Filled 2018-07-07: qty 5

## 2018-07-07 NOTE — Patient Instructions (Signed)
Implanted Port Home Guide An implanted port is a type of central line that is placed under the skin. Central lines are used to provide IV access when treatment or nutrition needs to be given through a person's veins. Implanted ports are used for long-term IV access. An implanted port may be placed because:  You need IV medicine that would be irritating to the small veins in your hands or arms.  You need long-term IV medicines, such as antibiotics.  You need IV nutrition for a long period.  You need frequent blood draws for lab tests.  You need dialysis.  Implanted ports are usually placed in the chest area, but they can also be placed in the upper arm, the abdomen, or the leg. An implanted port has two main parts:  Reservoir. The reservoir is round and will appear as a small, raised area under your skin. The reservoir is the part where a needle is inserted to give medicines or draw blood.  Catheter. The catheter is a thin, flexible tube that extends from the reservoir. The catheter is placed into a large vein. Medicine that is inserted into the reservoir goes into the catheter and then into the vein.  How will I care for my incision site? Do not get the incision site wet. Bathe or shower as directed by your health care provider. How is my port accessed? Special steps must be taken to access the port:  Before the port is accessed, a numbing cream can be placed on the skin. This helps numb the skin over the port site.  Your health care provider uses a sterile technique to access the port. ? Your health care provider must put on a mask and sterile gloves. ? The skin over your port is cleaned carefully with an antiseptic and allowed to dry. ? The port is gently pinched between sterile gloves, and a needle is inserted into the port.  Only "non-coring" port needles should be used to access the port. Once the port is accessed, a blood return should be checked. This helps ensure that the port  is in the vein and is not clogged.  If your port needs to remain accessed for a constant infusion, a clear (transparent) bandage will be placed over the needle site. The bandage and needle will need to be changed every week, or as directed by your health care provider.  Keep the bandage covering the needle clean and dry. Do not get it wet. Follow your health care provider's instructions on how to take a shower or bath while the port is accessed.  If your port does not need to stay accessed, no bandage is needed over the port.  What is flushing? Flushing helps keep the port from getting clogged. Follow your health care provider's instructions on how and when to flush the port. Ports are usually flushed with saline solution or a medicine called heparin. The need for flushing will depend on how the port is used.  If the port is used for intermittent medicines or blood draws, the port will need to be flushed: ? After medicines have been given. ? After blood has been drawn. ? As part of routine maintenance.  If a constant infusion is running, the port may not need to be flushed.  How long will my port stay implanted? The port can stay in for as long as your health care provider thinks it is needed. When it is time for the port to come out, surgery will be   done to remove it. The procedure is similar to the one performed when the port was put in. When should I seek immediate medical care? When you have an implanted port, you should seek immediate medical care if:  You notice a bad smell coming from the incision site.  You have swelling, redness, or drainage at the incision site.  You have more swelling or pain at the port site or the surrounding area.  You have a fever that is not controlled with medicine.  This information is not intended to replace advice given to you by your health care provider. Make sure you discuss any questions you have with your health care provider. Document  Released: 08/26/2005 Document Revised: 02/01/2016 Document Reviewed: 05/03/2013 Elsevier Interactive Patient Education  2017 [employer]  

## 2018-07-15 ENCOUNTER — Telehealth: Payer: Self-pay | Admitting: *Deleted

## 2018-07-15 NOTE — Telephone Encounter (Signed)
Patient called on 11/5 to inquire why her CT scan has not been scheduled yet. Called patient and informed her it was ordered for December prior to her office visit. Provided her central scheduling number for radiology to call and schedule it herself if she wishes to do so now. Most likely they have not started working on the December schedule.

## 2018-07-16 ENCOUNTER — Other Ambulatory Visit: Payer: Self-pay | Admitting: Surgery

## 2018-07-16 DIAGNOSIS — E041 Nontoxic single thyroid nodule: Secondary | ICD-10-CM

## 2018-07-16 DIAGNOSIS — E210 Primary hyperparathyroidism: Secondary | ICD-10-CM

## 2018-07-21 ENCOUNTER — Ambulatory Visit
Admission: RE | Admit: 2018-07-21 | Discharge: 2018-07-21 | Disposition: A | Discharge status: Home / Self Care | Payer: BC Managed Care – PPO | Source: Ambulatory Visit | Attending: Surgery | Admitting: Surgery

## 2018-07-21 ENCOUNTER — Other Ambulatory Visit (HOSPITAL_COMMUNITY)
Admission: RE | Admit: 2018-07-21 | Discharge: 2018-07-21 | Disposition: A | Discharge status: Home / Self Care | Payer: BC Managed Care – PPO | Source: Ambulatory Visit | Attending: Radiology | Admitting: Radiology

## 2018-07-21 DIAGNOSIS — E210 Primary hyperparathyroidism: Secondary | ICD-10-CM | POA: Diagnosis not present

## 2018-07-21 DIAGNOSIS — E041 Nontoxic single thyroid nodule: Secondary | ICD-10-CM | POA: Diagnosis not present

## 2018-08-10 ENCOUNTER — Telehealth: Payer: Self-pay | Admitting: *Deleted

## 2018-08-10 MED ORDER — PREDNISONE 50 MG PO TABS: ORAL_TABLET | ORAL | 0 refills | Status: DC

## 2018-08-10 NOTE — Telephone Encounter (Signed)
Called to confirm her prednisone premed will be called in for her CT scan on 08/25/18. Informed patient this will be sent in today. Also confirmed directions with her and to also take her OTC Benadryl 50 mg 1 hour prior to scan. She understands and agrees

## 2018-08-25 ENCOUNTER — Ambulatory Visit (HOSPITAL_COMMUNITY)
Admission: RE | Admit: 2018-08-25 | Discharge: 2018-08-25 | Disposition: A | Discharge status: Home / Self Care | Payer: BC Managed Care – PPO | Source: Ambulatory Visit | Attending: Nurse Practitioner | Admitting: Nurse Practitioner

## 2018-08-25 ENCOUNTER — Inpatient Hospital Stay: Payer: BC Managed Care – PPO

## 2018-08-25 ENCOUNTER — Inpatient Hospital Stay: Payer: BC Managed Care – PPO | Attending: Oncology

## 2018-08-25 DIAGNOSIS — D6959 Other secondary thrombocytopenia: Secondary | ICD-10-CM | POA: Insufficient documentation

## 2018-08-25 DIAGNOSIS — Z9221 Personal history of antineoplastic chemotherapy: Secondary | ICD-10-CM | POA: Diagnosis not present

## 2018-08-25 DIAGNOSIS — Z452 Encounter for adjustment and management of vascular access device: Secondary | ICD-10-CM | POA: Diagnosis not present

## 2018-08-25 DIAGNOSIS — C182 Malignant neoplasm of ascending colon: Secondary | ICD-10-CM

## 2018-08-25 DIAGNOSIS — Z9049 Acquired absence of other specified parts of digestive tract: Secondary | ICD-10-CM | POA: Diagnosis not present

## 2018-08-25 DIAGNOSIS — Z85038 Personal history of other malignant neoplasm of large intestine: Secondary | ICD-10-CM | POA: Insufficient documentation

## 2018-08-25 DIAGNOSIS — Z95828 Presence of other vascular implants and grafts: Secondary | ICD-10-CM

## 2018-08-25 LAB — CMP (CANCER CENTER ONLY)
ALK PHOS: 102 U/L (ref 38–126)
ALT: 19 U/L (ref 0–44)
AST: 14 U/L — ABNORMAL LOW (ref 15–41)
Albumin: 4 g/dL (ref 3.5–5.0)
Anion gap: 12 (ref 5–15)
BILIRUBIN TOTAL: 0.4 mg/dL (ref 0.3–1.2)
BUN: 11 mg/dL (ref 8–23)
CO2: 23 mmol/L (ref 22–32)
CREATININE: 0.73 mg/dL (ref 0.44–1.00)
Calcium: 10.3 mg/dL (ref 8.9–10.3)
Chloride: 103 mmol/L (ref 98–111)
GFR, Estimated: >60 mL/min (ref >60)
GLUCOSE: 140 mg/dL — AB (ref 70–99)
Potassium: 3.9 mmol/L (ref 3.5–5.1)
SODIUM: 138 mmol/L (ref 135–145)
TOTAL PROTEIN: 7.6 g/dL (ref 6.5–8.1)

## 2018-08-25 LAB — CEA (IN HOUSE-CHCC): CEA (CHCC-In House): 1.93 ng/mL (ref 0.00–5.00)

## 2018-08-25 MED ORDER — HEPARIN SOD (PORK) LOCK FLUSH 100 UNIT/ML IV SOLN
500.0000 [IU] | Freq: Once | INTRAVENOUS | Status: AC
  Administered 2018-08-25: 500 [IU] via INTRAVENOUS

## 2018-08-25 MED ORDER — HEPARIN SOD (PORK) LOCK FLUSH 100 UNIT/ML IV SOLN
INTRAVENOUS | Status: AC
  Administered 2018-08-25: 500 [IU] via INTRAVENOUS
  Filled 2018-08-25: qty 5

## 2018-08-25 MED ORDER — SODIUM CHLORIDE (PF) 0.9 % IJ SOLN
INTRAMUSCULAR | Status: AC
  Filled 2018-08-25: qty 50

## 2018-08-25 MED ORDER — SODIUM CHLORIDE 0.9% FLUSH
10.0000 mL | Freq: Once | INTRAVENOUS | Status: AC
  Administered 2018-08-25: 10 mL
  Filled 2018-08-25: qty 10

## 2018-08-25 MED ORDER — IOHEXOL 300 MG/ML  SOLN
100.0000 mL | Freq: Once | INTRAMUSCULAR | Status: AC | PRN
  Administered 2018-08-25: 100 mL via INTRAVENOUS

## 2018-08-27 ENCOUNTER — Inpatient Hospital Stay (HOSPITAL_BASED_OUTPATIENT_CLINIC_OR_DEPARTMENT_OTHER): Payer: BC Managed Care – PPO | Admitting: Oncology

## 2018-08-27 VITALS — BP 141/69 | HR 69 | Temp 97.4°F | Resp 18 | Ht 63.0 in | Wt 230.4 lb

## 2018-08-27 DIAGNOSIS — Z9049 Acquired absence of other specified parts of digestive tract: Secondary | ICD-10-CM

## 2018-08-27 DIAGNOSIS — D6959 Other secondary thrombocytopenia: Secondary | ICD-10-CM

## 2018-08-27 DIAGNOSIS — Z9221 Personal history of antineoplastic chemotherapy: Secondary | ICD-10-CM

## 2018-08-27 DIAGNOSIS — C182 Malignant neoplasm of ascending colon: Secondary | ICD-10-CM

## 2018-08-27 DIAGNOSIS — Z85038 Personal history of other malignant neoplasm of large intestine: Secondary | ICD-10-CM

## 2018-08-27 NOTE — Progress Notes (Signed)
[place] OFFICE PROGRESS NOTE   Diagnosis: Colon cancer  INTERVAL HISTORY:   Sharon Allison returns as scheduled.  She feels well.  Good appetite and energy level.  No difficulty with bowel function.  She underwent a surveillance colonoscopy 06/30/2018.  A 6 mm sessile polyp was removed from the transverse colon.  The pathology revealed a tubular adenoma.    Objective:  Vital signs in last 24 hours:  Blood pressure (!) 141/69, pulse 69, temperature (!) 97.4 F (36.3 C), temperature source Oral, resp. rate 18, height '5\' 3"'$  (1.6 m), weight 230 lb 6.4 oz (104.5 kg), last menstrual period 07/10/2001, SpO2 100 %.    HEENT: Neck without mass Lymphatics: No cervical, supraclavicular, axillary, or inguinal nodes Resp: Lungs clear bilaterally Cardio: Regular rate and rhythm GI: No hepatosplenomegaly, no mass, nontender Vascular: No leg edema   Portacath/PICC-without erythema  Lab Results:   Lab Results  Component Value Date   CEA1 1.93 08/25/2018    No results found for: INR  Imaging:  Ct Chest W Contrast  Result Date: 08/25/2018 CLINICAL DATA:  Ascending colon cancer, status post resection and chemotherapy EXAM: CT CHEST, ABDOMEN, AND PELVIS WITH CONTRAST TECHNIQUE: Multidetector CT imaging of the chest, abdomen and pelvis was performed following the standard protocol during bolus administration of intravenous contrast. CONTRAST:  18m OMNIPAQUE IOHEXOL 300 MG/ML  SOLN COMPARISON:  10/27/2017 FINDINGS: CT CHEST FINDINGS Cardiovascular: The heart is normal in size. No pericardial effusion. No evidence of thoracic aortic aneurysm. Right chest port terminates in the upper right atrium. Mediastinum/Nodes: No suspicious mediastinal, hilar, or axillary lymphadenopathy. 4 mm short axis left supraclavicular node (series 2/image 9), unchanged, within normal limits. Visualized thyroid is unremarkable. Lungs/Pleura: No suspicious pulmonary nodules. No focal consolidation.  No pleural effusion or pneumothorax. Musculoskeletal: Degenerative changes of the thoracic spine. CT ABDOMEN PELVIS FINDINGS Hepatobiliary: Liver is within normal limits. No suspicious/enhancing hepatic lesions. Gallbladder is unremarkable. No intrahepatic or extrahepatic ductal dilatation. Pancreas: Within normal limits. Spleen: Within normal limits. Adrenals/Urinary Tract: Adrenal glands are within normal limits. Kidneys are within normal limits.  No hydronephrosis. Bladder is within normal limits. Stomach/Bowel: Stomach is within normal limits. Status post right hemicolectomy with appendectomy. No evidence of bowel obstruction. Vascular/Lymphatic: No evidence of abdominal aortic aneurysm. No suspicious abdominopelvic lymphadenopathy. Reproductive: Status post hysterectomy. Bilateral ovaries are within normal limits. Other: No abdominopelvic ascites. Musculoskeletal: Visualized osseous structures are within normal limits. IMPRESSION: Status post right hemicolectomy. No evidence of recurrent or metastatic disease. Electronically Signed   By: SJulian HyM.D.   On: 08/25/2018 13:15   Ct Abdomen Pelvis W Contrast  Result Date: 08/25/2018 CLINICAL DATA:  Ascending colon cancer, status post resection and chemotherapy EXAM: CT CHEST, ABDOMEN, AND PELVIS WITH CONTRAST TECHNIQUE: Multidetector CT imaging of the chest, abdomen and pelvis was performed following the standard protocol during bolus administration of intravenous contrast. CONTRAST:  1042mOMNIPAQUE IOHEXOL 300 MG/ML  SOLN COMPARISON:  10/27/2017 FINDINGS: CT CHEST FINDINGS Cardiovascular: The heart is normal in size. No pericardial effusion. No evidence of thoracic aortic aneurysm. Right chest port terminates in the upper right atrium. Mediastinum/Nodes: No suspicious mediastinal, hilar, or axillary lymphadenopathy. 4 mm short axis left supraclavicular node (series 2/image 9), unchanged, within normal limits. Visualized thyroid is unremarkable.  Lungs/Pleura: No suspicious pulmonary nodules. No focal consolidation. No pleural effusion or pneumothorax. Musculoskeletal: Degenerative changes of the thoracic spine. CT ABDOMEN PELVIS FINDINGS Hepatobiliary: Liver is within normal limits. No suspicious/enhancing hepatic lesions. Gallbladder is unremarkable.  No intrahepatic or extrahepatic ductal dilatation. Pancreas: Within normal limits. Spleen: Within normal limits. Adrenals/Urinary Tract: Adrenal glands are within normal limits. Kidneys are within normal limits.  No hydronephrosis. Bladder is within normal limits. Stomach/Bowel: Stomach is within normal limits. Status post right hemicolectomy with appendectomy. No evidence of bowel obstruction. Vascular/Lymphatic: No evidence of abdominal aortic aneurysm. No suspicious abdominopelvic lymphadenopathy. Reproductive: Status post hysterectomy. Bilateral ovaries are within normal limits. Other: No abdominopelvic ascites. Musculoskeletal: Visualized osseous structures are within normal limits. IMPRESSION: Status post right hemicolectomy. No evidence of recurrent or metastatic disease. Electronically Signed   By: Julian Hy M.D.   On: 08/25/2018 13:15    Medications: I have reviewed the patient's current medications.   Assessment/Plan: 1. 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 MSI-stable ? 3/14 lymph nodes positive for metastatic carcinoma, lymphovascular invasion present ? Cycle 1 FOLFOX 10/22/2017 ? CTs chest, abdomen, and pelvis 10/28/2017-negative for metastatic disease ? Cycle 2 FOLFOX 11/05/2017 ? Cycle 3 FOLFOX 11/27/2017 ? Cycle 4 FOLFOX 12/10/2017 ? Cycle 5 FOLFOX 12/24/2017 ? Cycle 6 FOLFOX 01/08/2018 (Oxaliplatin held due to thrombocytopenia) ? Cycle 7 FOLFOX 01/21/2018-oxaliplatin resumed ? Cycle 8 FOLFOX 02/04/2018-Oxaliplatin eliminated due to an allergic reaction with cycle 7 ? Cycle 9 FOLFOX6/26/2019-no Oxaliplatin; 5-FU bolus  eliminated, infusional 5-FU dose reduced ? Cycle 10 FOLFOX 03/17/2018 ? Cycle 11 FOLFOX 03/31/2018 ? Cycle 12 FOLFOX 04/14/2018 ? Surveillance colonoscopy 06/30/2018-tubular adenoma removed from the transverse colon ? CTs 08/25/2018-negative  2.Asthma  3.Port-A-Cath placement 10/17/2017  4.History of neutropenia secondary to chemotherapy-resolved  5.History of thrombocytopenia secondary to chemotherapy  6.Allergic reaction to Oxaliplatin 01/21/2018-pruritus, tachycardia, diaphoresis, hypotension  7.Diarrhea following cycle 8 FOLFOX-resolved, 5-FU bolus eliminated and infusion dose decreased beginning with cycle 9    Disposition: Sharon Allison remains in clinical remission from colon cancer.  She will return for an office visit and CEA in 6 months.  She would like to keep the Port-A-Cath in place.  She reports difficulty with IV access.  She understands the risk of infection and thrombosis.  She does not have a primary care provider.  We will make a referral to [facility] internal medicine.  15 minutes were spent with the patient today.  The majority of the time was used for counseling and coordination of care.  Betsy Coder, MD  08/27/2018  12:11 PM

## 2018-08-28 ENCOUNTER — Telehealth: Payer: Self-pay | Admitting: Oncology

## 2018-08-28 NOTE — Telephone Encounter (Signed)
Scheduled appt per 12/19 los - sent reminder letter in the mail .

## 2018-09-17 NOTE — Progress Notes (Signed)
Thank you for the update and care of our mutual patient.  Sharon Allison

## 2018-10-08 ENCOUNTER — Inpatient Hospital Stay: Payer: BC Managed Care – PPO | Attending: Oncology

## 2018-10-08 DIAGNOSIS — C182 Malignant neoplasm of ascending colon: Secondary | ICD-10-CM

## 2018-10-08 DIAGNOSIS — Z85038 Personal history of other malignant neoplasm of large intestine: Secondary | ICD-10-CM | POA: Diagnosis present

## 2018-10-08 DIAGNOSIS — Z452 Encounter for adjustment and management of vascular access device: Secondary | ICD-10-CM | POA: Insufficient documentation

## 2018-10-08 DIAGNOSIS — Z95828 Presence of other vascular implants and grafts: Secondary | ICD-10-CM

## 2018-10-08 MED ORDER — SODIUM CHLORIDE 0.9% FLUSH
10.0000 mL | Freq: Once | INTRAVENOUS | Status: AC
  Administered 2018-10-08: 10 mL
  Filled 2018-10-08: qty 10

## 2018-10-08 MED ORDER — HEPARIN SOD (PORK) LOCK FLUSH 100 UNIT/ML IV SOLN
500.0000 [IU] | Freq: Once | INTRAVENOUS | Status: AC
  Administered 2018-10-08: 500 [IU]
  Filled 2018-10-08: qty 5

## 2018-10-20 ENCOUNTER — Telehealth: Payer: Self-pay | Admitting: *Deleted

## 2018-10-20 NOTE — Telephone Encounter (Signed)
Left VM for patient requesting call back to research nurse to complete follow up for 12 month time point on the [id number] study. Foye Spurling, BSN, RN Clinical Research Nurse 10/20/2018 2:27 PM

## 2018-10-30 ENCOUNTER — Telehealth: Payer: Self-pay | Admitting: *Deleted

## 2018-10-30 NOTE — Telephone Encounter (Signed)
[place] STUDY; Called patient to check on her status for the 12 month time point since enrolling on study. Patient has completed chemotherapy with curative intent and no further treatment planned at this time for her stage 3 colon cancer.She states she is still has some side effects from treatment such as fatigue and neuropathy.  She is able to perform all of her ADLs and instrumental ADLs by herself.Patient is working part time most of the year and seasonally has a second job a few months out of the year.RN assessed ECOG level as 1 due to ongoing fatigue and neuropathy. Patientdenies any changes to her insurance since beginning on study. Patient states she has received email from the study to complete questionnaire and she will do this soon.  Thanked patient for her ongoing participation in this study. I will contact her again in one year for the last follow up form. Encouraged patient to contact research nurse if any questions before next contact. She verbalized understanding.  Foye Spurling, BSN, RN Clinical Research Nurse 10/30/2018 1:51 PM

## 2018-11-19 ENCOUNTER — Other Ambulatory Visit: Payer: Self-pay

## 2018-11-19 ENCOUNTER — Inpatient Hospital Stay: Payer: BC Managed Care – PPO | Attending: Oncology

## 2018-11-19 DIAGNOSIS — Z95828 Presence of other vascular implants and grafts: Secondary | ICD-10-CM

## 2018-11-19 DIAGNOSIS — Z85038 Personal history of other malignant neoplasm of large intestine: Secondary | ICD-10-CM | POA: Diagnosis present

## 2018-11-19 DIAGNOSIS — Z452 Encounter for adjustment and management of vascular access device: Secondary | ICD-10-CM | POA: Diagnosis present

## 2018-11-19 DIAGNOSIS — C182 Malignant neoplasm of ascending colon: Secondary | ICD-10-CM

## 2018-11-19 MED ORDER — SODIUM CHLORIDE 0.9% FLUSH
10.0000 mL | Freq: Once | INTRAVENOUS | Status: AC
  Administered 2018-11-19: 10 mL
  Filled 2018-11-19: qty 10

## 2018-11-19 MED ORDER — HEPARIN SOD (PORK) LOCK FLUSH 100 UNIT/ML IV SOLN
500.0000 [IU] | Freq: Once | INTRAVENOUS | Status: AC
  Administered 2018-11-19: 500 [IU]
  Filled 2018-11-19: qty 5

## 2018-12-31 ENCOUNTER — Other Ambulatory Visit: Payer: Self-pay

## 2018-12-31 ENCOUNTER — Inpatient Hospital Stay: Payer: BC Managed Care – PPO | Attending: Oncology

## 2018-12-31 DIAGNOSIS — Z95828 Presence of other vascular implants and grafts: Secondary | ICD-10-CM

## 2018-12-31 DIAGNOSIS — C182 Malignant neoplasm of ascending colon: Secondary | ICD-10-CM

## 2018-12-31 DIAGNOSIS — Z452 Encounter for adjustment and management of vascular access device: Secondary | ICD-10-CM | POA: Diagnosis not present

## 2018-12-31 DIAGNOSIS — Z85038 Personal history of other malignant neoplasm of large intestine: Secondary | ICD-10-CM | POA: Insufficient documentation

## 2018-12-31 MED ORDER — SODIUM CHLORIDE 0.9% FLUSH
10.0000 mL | Freq: Once | INTRAVENOUS | Status: AC
  Administered 2018-12-31: 14:00:00 10 mL
  Filled 2018-12-31: qty 10

## 2018-12-31 MED ORDER — HEPARIN SOD (PORK) LOCK FLUSH 100 UNIT/ML IV SOLN
500.0000 [IU] | Freq: Once | INTRAVENOUS | Status: AC
  Administered 2018-12-31: 500 [IU]
  Filled 2018-12-31: qty 5

## 2019-01-03 IMAGING — DX DG CHEST 1V PORT
1 series · 1 of 1 positions shown · non-contrast
Comparison: Portable exam 1456 hours compared to 03/26/2005

CLINICAL DATA: Port-A-Cath placement

EXAM:
PORTABLE CHEST 1 VIEW

[chest ap]
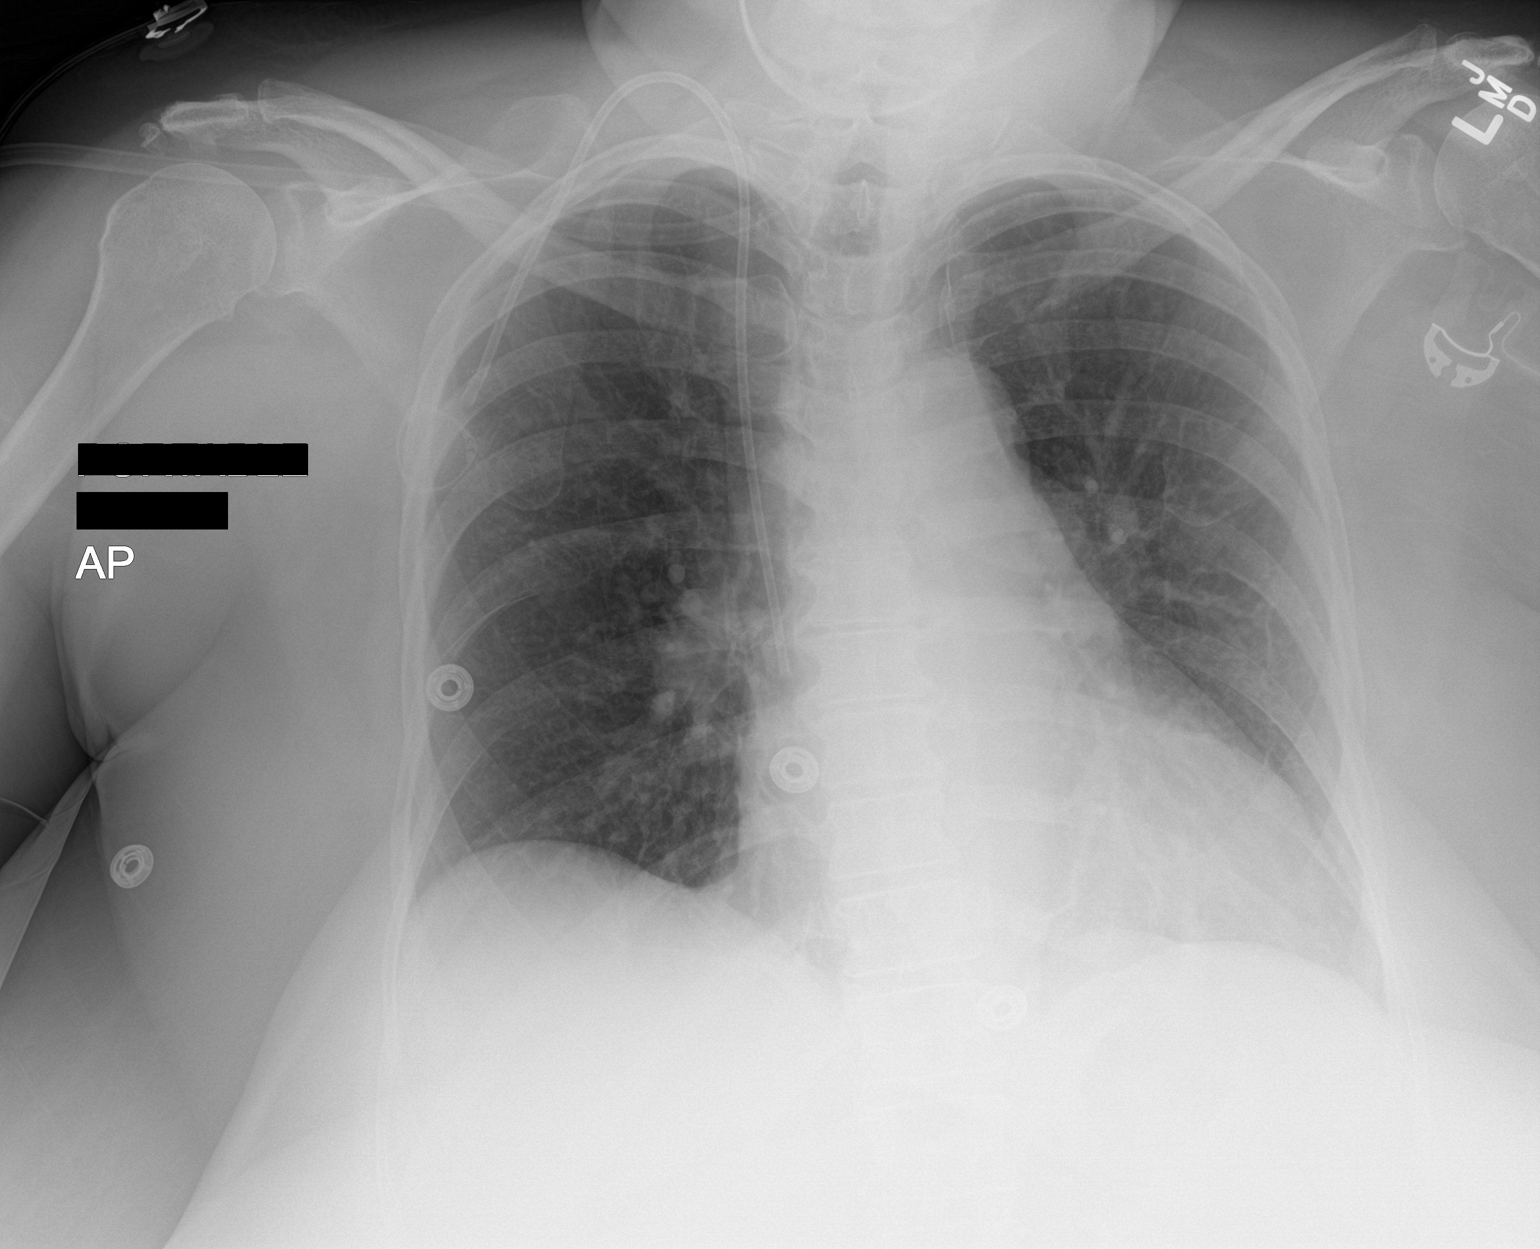

[1 of 1 positions shown; findings below may reference images not displayed]

FINDINGS: RIGHT jugular Port-A-Cath with tip projecting over SVC near
cavoatrial junction.

Enlargement of cardiac silhouette.

Mediastinal contours and pulmonary vascularity normal.

Lungs clear.

No pleural effusion or pneumothorax.

Mild scattered endplate spur formation thoracic spine.
IMPRESSION: No pneumothorax following RIGHT jugular Port-A-Cath placement.

Enlargement of cardiac silhouette.

## 2019-02-11 ENCOUNTER — Inpatient Hospital Stay: Payer: BC Managed Care – PPO | Attending: Oncology

## 2019-02-11 ENCOUNTER — Other Ambulatory Visit: Payer: Self-pay

## 2019-02-11 ENCOUNTER — Inpatient Hospital Stay (HOSPITAL_BASED_OUTPATIENT_CLINIC_OR_DEPARTMENT_OTHER): Payer: BC Managed Care – PPO | Admitting: Nurse Practitioner

## 2019-02-11 ENCOUNTER — Telehealth: Payer: Self-pay

## 2019-02-11 ENCOUNTER — Encounter: Payer: Self-pay | Admitting: Nurse Practitioner

## 2019-02-11 ENCOUNTER — Inpatient Hospital Stay: Payer: BC Managed Care – PPO

## 2019-02-11 VITALS — BP 140/87 | HR 85 | Temp 98.7°F | Resp 18 | Ht 63.0 in | Wt 240.1 lb

## 2019-02-11 DIAGNOSIS — Z9221 Personal history of antineoplastic chemotherapy: Secondary | ICD-10-CM | POA: Diagnosis not present

## 2019-02-11 DIAGNOSIS — C182 Malignant neoplasm of ascending colon: Secondary | ICD-10-CM

## 2019-02-11 DIAGNOSIS — J45909 Unspecified asthma, uncomplicated: Secondary | ICD-10-CM

## 2019-02-11 DIAGNOSIS — Z85038 Personal history of other malignant neoplasm of large intestine: Secondary | ICD-10-CM | POA: Diagnosis present

## 2019-02-11 DIAGNOSIS — Z95828 Presence of other vascular implants and grafts: Secondary | ICD-10-CM

## 2019-02-11 LAB — CBC WITH DIFFERENTIAL (CANCER CENTER ONLY)
Basophils Absolute: 0 10*3/uL (ref 0.0–0.1)
Basophils Relative: 1 %
Eosinophils Absolute: 0.1 10*3/uL (ref 0.0–0.5)
Eosinophils Relative: 2 %
HCT: 38.3 % (ref 36.0–46.0)
Hemoglobin: 12.6 g/dL (ref 12.0–15.0)
Lymphocytes Relative: 31 %
Lymphs Abs: 2.2 10*3/uL (ref 0.7–4.0)
MCH: 29.8 pg (ref 26.0–34.0)
MCHC: 32.9 g/dL (ref 30.0–36.0)
MCV: 90.5 fL (ref 80.0–100.0)
Monocytes Absolute: 0.7 10*3/uL (ref 0.1–1.0)
Monocytes Relative: 9 %
Neutro Abs: 4.2 10*3/uL (ref 1.7–7.7)
Neutrophils Relative %: 57 %
Platelet Count: 258 10*3/uL (ref 150–400)
RBC: 4.23 MIL/uL (ref 3.87–5.11)
RDW: 13.1 % (ref 11.5–15.5)
WBC Count: 7.3 10*3/uL (ref 4.0–10.5)
nRBC: 0 % (ref 0.0–0.2)

## 2019-02-11 LAB — CMP (CANCER CENTER ONLY)
ALT: 28 U/L (ref 0–44)
AST: 27 U/L (ref 15–41)
Albumin: 4 g/dL (ref 3.5–5.0)
Alkaline Phosphatase: 90 U/L (ref 38–126)
Anion gap: 9 (ref 5–15)
BUN: 13 mg/dL (ref 8–23)
CO2: 25 mmol/L (ref 22–32)
Calcium: 10.1 mg/dL (ref 8.9–10.3)
Chloride: 104 mmol/L (ref 98–111)
Creatinine: 0.72 mg/dL (ref 0.44–1.00)
GFR, Est AFR Am: >60 mL/min (ref >60)
GFR, Estimated: >60 mL/min (ref >60)
Glucose, Bld: 102 mg/dL — ABNORMAL HIGH (ref 70–99)
Potassium: 4 mmol/L (ref 3.5–5.1)
Sodium: 138 mmol/L (ref 135–145)
Total Bilirubin: 0.4 mg/dL (ref 0.3–1.2)
Total Protein: 7.1 g/dL (ref 6.5–8.1)

## 2019-02-11 LAB — CEA (IN HOUSE-CHCC): CEA (CHCC-In House): 1.29 ng/mL (ref 0.00–5.00)

## 2019-02-11 MED ORDER — SODIUM CHLORIDE 0.9% FLUSH
10.0000 mL | Freq: Once | INTRAVENOUS | Status: AC
  Administered 2019-02-11: 10 mL
  Filled 2019-02-11: qty 10

## 2019-02-11 MED ORDER — PREDNISONE 50 MG PO TABS: ORAL_TABLET | ORAL | 0 refills | Status: DC

## 2019-02-11 MED ORDER — HEPARIN SOD (PORK) LOCK FLUSH 100 UNIT/ML IV SOLN
500.0000 [IU] | Freq: Once | INTRAVENOUS | Status: AC
  Administered 2019-02-11: 500 [IU]
  Filled 2019-02-11: qty 5

## 2019-02-11 NOTE — Telephone Encounter (Signed)
Left voicemail for patient to call back [facility] 

## 2019-02-11 NOTE — Patient Instructions (Signed)

## 2019-02-11 NOTE — Progress Notes (Signed)
  [place] OFFICE PROGRESS NOTE   Diagnosis: Colon cancer  INTERVAL HISTORY:   Sharon Allison returns as scheduled.  She overall feels well.  She has a good appetite.  She denies pain.  Bowels moving regularly.  No bloody or black stools.  Objective:  Vital signs in last 24 hours:  Blood pressure 140/87, pulse 85, temperature 98.7 F (37.1 C), temperature source Oral, resp. rate 18, height _0  (1.6 m), weight 240 lb 1.6 oz (108.9 kg), last menstrual period 07/10/2001, SpO2 99 %.    HEENT: No thrush or ulcers.  Ear canal/tympanic membranes appear normal bilaterally. Lymphatics: No palpable cervical, supraclavicular, axillary or inguinal lymph nodes. GI: Abdomen soft and nontender.  No hepatomegaly.  No mass. Vascular: No leg edema. Port-A-Cath without erythema.  Lab Results:  Lab Results  Component Value Date   WBC 7.3 02/11/2019   HGB 12.6 02/11/2019   HCT 38.3 02/11/2019   MCV 90.5 02/11/2019   PLT 258 02/11/2019   NEUTROABS 4.2 02/11/2019    Imaging:  No results found.  Medications: I have reviewed the patient's current medications.  Assessment/Plan: 1. 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 MSI-stable ? 3/14 lymph nodes positive for metastatic carcinoma, lymphovascular invasion present ? Cycle 1 FOLFOX 10/22/2017 ? CTs chest, abdomen, and pelvis 10/28/2017-negative for metastatic disease ? Cycle 2 FOLFOX 11/05/2017 ? Cycle 3 FOLFOX 11/27/2017 ? Cycle 4 FOLFOX 12/10/2017 ? Cycle 5 FOLFOX 12/24/2017 ? Cycle 6 FOLFOX 01/08/2018 (Oxaliplatin held due to thrombocytopenia) ? Cycle 7 FOLFOX 01/21/2018-oxaliplatin resumed ? Cycle 8 FOLFOX 02/04/2018-Oxaliplatin eliminated due to an allergic reaction with cycle 7 ? Cycle 9 FOLFOX6/26/2019-no Oxaliplatin; 5-FU bolus eliminated, infusional 5-FU dose reduced ? Cycle 10 FOLFOX 03/17/2018 ? Cycle 11 FOLFOX 03/31/2018 ? Cycle 12 FOLFOX 04/14/2018 ? Surveillance  colonoscopy 06/30/2018-tubular adenoma removed from the transverse colon ? CTs 08/25/2018-negative  2.Asthma  3.Port-A-Cath placement 10/17/2017  4.History of neutropenia secondary to chemotherapy-resolved  5.History of thrombocytopenia secondary to chemotherapy  6.Allergic reaction to Oxaliplatin 01/21/2018-pruritus, tachycardia, diaphoresis, hypotension  7.Diarrhea following cycle 8 FOLFOX-resolved, 5-FU bolus eliminated and infusion dose decreased beginning with cycle 9   Disposition: Sharon Allison remains in clinical remission from colon cancer.  We will follow-up on the CEA from today.  She will undergo surveillance CT scans in approximately 6 months.  We will see her in follow-up a few days after scans to review the results.  She would like to leave the Port-A-Cath in place.  She will return for a flush every 6 weeks.  She does not have a primary care provider.  We made a referral to [place] at [facility].    Ned Card ANP/GNP-BC   02/11/2019  2:59 PM

## 2019-02-12 ENCOUNTER — Telehealth: Payer: Self-pay

## 2019-02-12 ENCOUNTER — Telehealth: Payer: Self-pay | Admitting: Nurse Practitioner

## 2019-02-12 NOTE — Telephone Encounter (Signed)
Scheduled appt per 6/4 los. ° °A calendar will be mailed out. °

## 2019-02-12 NOTE — Telephone Encounter (Signed)
Spoke with pt advised per Ned Card NP CEA remains stable with in the normal range and to F/U as scheduled. Pt verbalized understanding.

## 2019-02-12 NOTE — Telephone Encounter (Signed)
-----   Message from Owens Shark, NP sent at 02/11/2019  4:33 PM EDT ----- Please let her know the CEA remains stable in normal range.  Follow-up as scheduled.

## 2019-03-26 ENCOUNTER — Other Ambulatory Visit: Payer: Self-pay

## 2019-03-26 ENCOUNTER — Inpatient Hospital Stay: Payer: BC Managed Care – PPO | Attending: Oncology

## 2019-03-26 DIAGNOSIS — C182 Malignant neoplasm of ascending colon: Secondary | ICD-10-CM

## 2019-03-26 DIAGNOSIS — C180 Malignant neoplasm of cecum: Secondary | ICD-10-CM | POA: Insufficient documentation

## 2019-03-26 DIAGNOSIS — Z95828 Presence of other vascular implants and grafts: Secondary | ICD-10-CM

## 2019-03-26 DIAGNOSIS — Z452 Encounter for adjustment and management of vascular access device: Secondary | ICD-10-CM | POA: Insufficient documentation

## 2019-03-26 MED ORDER — SODIUM CHLORIDE 0.9% FLUSH
10.0000 mL | Freq: Once | INTRAVENOUS | Status: AC
  Administered 2019-03-26: 10 mL
  Filled 2019-03-26: qty 10

## 2019-03-26 MED ORDER — HEPARIN SOD (PORK) LOCK FLUSH 100 UNIT/ML IV SOLN
500.0000 [IU] | Freq: Once | INTRAVENOUS | Status: AC
  Administered 2019-03-26: 500 [IU]
  Filled 2019-03-26: qty 5

## 2019-05-04 ENCOUNTER — Other Ambulatory Visit: Payer: Self-pay | Admitting: Surgery

## 2019-05-04 ENCOUNTER — Ambulatory Visit: Payer: BC Managed Care – PPO | Admitting: Certified Nurse Midwife

## 2019-05-04 DIAGNOSIS — E041 Nontoxic single thyroid nodule: Secondary | ICD-10-CM

## 2019-05-07 ENCOUNTER — Inpatient Hospital Stay: Payer: BC Managed Care – PPO | Attending: Oncology

## 2019-05-07 ENCOUNTER — Other Ambulatory Visit: Payer: Self-pay

## 2019-05-07 DIAGNOSIS — Z79899 Other long term (current) drug therapy: Secondary | ICD-10-CM | POA: Diagnosis not present

## 2019-05-07 DIAGNOSIS — J45909 Unspecified asthma, uncomplicated: Secondary | ICD-10-CM | POA: Diagnosis not present

## 2019-05-07 DIAGNOSIS — Z452 Encounter for adjustment and management of vascular access device: Secondary | ICD-10-CM | POA: Diagnosis present

## 2019-05-07 DIAGNOSIS — C779 Secondary and unspecified malignant neoplasm of lymph node, unspecified: Secondary | ICD-10-CM | POA: Insufficient documentation

## 2019-05-07 DIAGNOSIS — C182 Malignant neoplasm of ascending colon: Secondary | ICD-10-CM

## 2019-05-07 DIAGNOSIS — C180 Malignant neoplasm of cecum: Secondary | ICD-10-CM | POA: Diagnosis present

## 2019-05-07 DIAGNOSIS — Z862 Personal history of diseases of the blood and blood-forming organs and certain disorders involving the immune mechanism: Secondary | ICD-10-CM | POA: Diagnosis not present

## 2019-05-07 DIAGNOSIS — Z95828 Presence of other vascular implants and grafts: Secondary | ICD-10-CM

## 2019-05-07 DIAGNOSIS — R197 Diarrhea, unspecified: Secondary | ICD-10-CM | POA: Diagnosis not present

## 2019-05-07 MED ORDER — SODIUM CHLORIDE 0.9% FLUSH
10.0000 mL | Freq: Once | INTRAVENOUS | Status: AC
  Administered 2019-05-07: 10 mL
  Filled 2019-05-07: qty 10

## 2019-05-07 MED ORDER — HEPARIN SOD (PORK) LOCK FLUSH 100 UNIT/ML IV SOLN
500.0000 [IU] | Freq: Once | INTRAVENOUS | Status: AC
  Administered 2019-05-07: 500 [IU]
  Filled 2019-05-07: qty 5

## 2019-05-07 NOTE — Patient Instructions (Signed)

## 2019-05-10 ENCOUNTER — Other Ambulatory Visit: Payer: BC Managed Care – PPO

## 2019-05-12 ENCOUNTER — Ambulatory Visit: Payer: BC Managed Care – PPO | Admitting: Certified Nurse Midwife

## 2019-05-19 ENCOUNTER — Ambulatory Visit
Admission: RE | Admit: 2019-05-19 | Discharge: 2019-05-19 | Disposition: A | Discharge status: Home / Self Care | Payer: BC Managed Care – PPO | Source: Ambulatory Visit | Attending: Surgery | Admitting: Surgery

## 2019-05-19 DIAGNOSIS — E041 Nontoxic single thyroid nodule: Secondary | ICD-10-CM

## 2019-06-14 ENCOUNTER — Ambulatory Visit: Payer: BC Managed Care – PPO | Admitting: Certified Nurse Midwife

## 2019-06-14 ENCOUNTER — Other Ambulatory Visit: Payer: Self-pay

## 2019-06-14 ENCOUNTER — Encounter: Payer: Self-pay | Admitting: Certified Nurse Midwife

## 2019-06-14 VITALS — BP 122/70 | HR 70 | Temp 97.2°F | Resp 16 | Ht 62.0 in | Wt 248.0 lb

## 2019-06-14 DIAGNOSIS — Z01411 Encounter for gynecological examination (general) (routine) with abnormal findings: Secondary | ICD-10-CM | POA: Diagnosis not present

## 2019-06-14 DIAGNOSIS — Z780 Asymptomatic menopausal state: Secondary | ICD-10-CM

## 2019-06-14 DIAGNOSIS — N6310 Unspecified lump in the right breast, unspecified quadrant: Secondary | ICD-10-CM | POA: Diagnosis not present

## 2019-06-14 DIAGNOSIS — Z85038 Personal history of other malignant neoplasm of large intestine: Secondary | ICD-10-CM

## 2019-06-14 NOTE — Patient Instructions (Signed)
EXERCISE AND DIET:  We recommended that you start or continue a regular exercise program for good health. Regular exercise means any activity that makes your heart beat faster and makes you sweat.  We recommend exercising at least 30 minutes per day at least 3 days a week, preferably 4 or 5.  We also recommend a diet low in fat and sugar.  Inactivity, poor dietary choices and obesity can cause diabetes, heart attack, stroke, and kidney damage, among others.   ° °ALCOHOL AND SMOKING:  Women should limit their alcohol intake to no more than 7 drinks/beers/glasses of wine (combined, not each!) per week. Moderation of alcohol intake to this level decreases your risk of breast cancer and liver damage. And of course, no recreational drugs are part of a healthy lifestyle.  And absolutely no smoking or even second hand smoke. Most people know smoking can cause heart and lung diseases, but did you know it also contributes to weakening of your bones? Aging of your skin?  Yellowing of your teeth and nails? ° °CALCIUM AND VITAMIN D:  Adequate intake of calcium and Vitamin D are recommended.  The recommendations for exact amounts of these supplements seem to change often, but generally speaking 600 mg of calcium (either carbonate or citrate) and 800 units of Vitamin D per day seems prudent. Certain women may benefit from higher intake of Vitamin D.  If you are among these women, your doctor will have told you during your visit.   ° °PAP SMEARS:  Pap smears, to check for cervical cancer or precancers,  have traditionally been done yearly, although recent scientific advances have shown that most women can have pap smears less often.  However, every woman still should have a physical exam from her gynecologist every year. It will include a breast check, inspection of the vulva and vagina to check for abnormal growths or skin changes, a visual exam of the cervix, and then an exam to evaluate the size and shape of the uterus and  ovaries.  And after 63 years of age, a rectal exam is indicated to check for rectal cancers. We will also provide age appropriate advice regarding health maintenance, like when you should have certain vaccines, screening for sexually transmitted diseases, bone density testing, colonoscopy, mammograms, etc.  ° °MAMMOGRAMS:  All women over 40 years old should have a yearly mammogram. Many facilities now offer a "3D" mammogram, which may cost around $50 extra out of pocket. If possible,  we recommend you accept the option to have the 3D mammogram performed.  It both reduces the number of women who will be called back for extra views which then turn out to be normal, and it is better than the routine mammogram at detecting truly abnormal areas.   ° °COLONOSCOPY:  Colonoscopy to screen for colon cancer is recommended for all women at age 50.  We know, you hate the idea of the prep.  We agree, BUT, having colon cancer and not knowing it is worse!!  Colon cancer so often starts as a polyp that can be seen and removed at colonscopy, which can quite literally save your life!  And if your first colonoscopy is normal and you have no family history of colon cancer, most women don't have to have it again for 10 years.  Once every ten years, you can do something that may end up saving your life, right?  We will be happy to help you get it scheduled when you are ready.    Be sure to check your insurance coverage so you understand how much it will cost.  It may be covered as a preventative service at no cost, but you should check your particular policy.      Calorie Counting for Weight Loss Calories are units of energy. Your body needs a certain amount of calories from food to keep you going throughout the day. When you eat more calories than your body needs, your body stores the extra calories as fat. When you eat fewer calories than your body needs, your body burns fat to get the energy it needs. Calorie counting means  keeping track of how many calories you eat and drink each day. Calorie counting can be helpful if you need to lose weight. If you make sure to eat fewer calories than your body needs, you should lose weight. Ask your health care provider what a healthy weight is for you. For calorie counting to work, you will need to eat the right number of calories in a day in order to lose a healthy amount of weight per week. A dietitian can help you determine how many calories you need in a day and will give you suggestions on how to reach your calorie goal.  A healthy amount of weight to lose per week is usually 1-2 lb (0.5-0.9 kg). This usually means that your daily calorie intake should be reduced by 500-750 calories.  Eating 1,200 - 1,500 calories per day can help most women lose weight.  Eating 1,500 - 1,800 calories per day can help most men lose weight. What is my plan? My goal is to have __________ calories per day. If I have this many calories per day, I should lose around __________ pounds per week. What do I need to know about calorie counting? In order to meet your daily calorie goal, you will need to:  Find out how many calories are in each food you would like to eat. Try to do this before you eat.  Decide how much of the food you plan to eat.  Write down what you ate and how many calories it had. Doing this is called keeping a food log. To successfully lose weight, it is important to balance calorie counting with a healthy lifestyle that includes regular activity. Aim for 150 minutes of moderate exercise (such as walking) or 75 minutes of vigorous exercise (such as running) each week. Where do I find calorie information?  The number of calories in a food can be found on a Nutrition Facts label. If a food does not have a Nutrition Facts label, try to look up the calories online or ask your dietitian for help. Remember that calories are listed per serving. If you choose to have more than one  serving of a food, you will have to multiply the calories per serving by the amount of servings you plan to eat. For example, the label on a package of bread might say that a serving size is 1 slice and that there are 90 calories in a serving. If you eat 1 slice, you will have eaten 90 calories. If you eat 2 slices, you will have eaten 180 calories. How do I keep a food log? Immediately after each meal, record the following information in your food log:  What you ate. Don't forget to include toppings, sauces, and other extras on the food.  How much you ate. This can be measured in cups, ounces, or number of items.  How many calories  each food and drink had.  The total number of calories in the meal. Keep your food log near you, such as in a small notebook in your pocket, or use a mobile app or website. Some programs will calculate calories for you and show you how many calories you have left for the day to meet your goal. What are some calorie counting tips?   Use your calories on foods and drinks that will fill you up and not leave you hungry: ? Some examples of foods that fill you up are nuts and nut butters, vegetables, lean proteins, and high-fiber foods like whole grains. High-fiber foods are foods with more than 5 g fiber per serving. ? Drinks such as sodas, specialty coffee drinks, alcohol, and juices have a lot of calories, yet do not fill you up.  Eat nutritious foods and avoid empty calories. Empty calories are calories you get from foods or beverages that do not have many vitamins or protein, such as candy, sweets, and soda. It is better to have a nutritious high-calorie food (such as an avocado) than a food with few nutrients (such as a bag of chips).  Know how many calories are in the foods you eat most often. This will help you calculate calorie counts faster.  Pay attention to calories in drinks. Low-calorie drinks include water and unsweetened drinks.  Pay attention to  nutrition labels for "low fat" or "fat free" foods. These foods sometimes have the same amount of calories or more calories than the full fat versions. They also often have added sugar, starch, or salt, to make up for flavor that was removed with the fat.  Find a way of tracking calories that works for you. Get creative. Try different apps or programs if writing down calories does not work for you. What are some portion control tips?  Know how many calories are in a serving. This will help you know how many servings of a certain food you can have.  Use a measuring cup to measure serving sizes. You could also try weighing out portions on a kitchen scale. With time, you will be able to estimate serving sizes for some foods.  Take some time to put servings of different foods on your favorite plates, bowls, and cups so you know what a serving looks like.  Try not to eat straight from a bag or box. Doing this can lead to overeating. Put the amount you would like to eat in a cup or on a plate to make sure you are eating the right portion.  Use smaller plates, glasses, and bowls to prevent overeating.  Try not to multitask (for example, watch TV or use your computer) while eating. If it is time to eat, sit down at a table and enjoy your food. This will help you to know when you are full. It will also help you to be aware of what you are eating and how much you are eating. What are tips for following this plan? Reading food labels  Check the calorie count compared to the serving size. The serving size may be smaller than what you are used to eating.  Check the source of the calories. Make sure the food you are eating is high in vitamins and protein and low in saturated and trans fats. Shopping  Read nutrition labels while you shop. This will help you make healthy decisions before you decide to purchase your food.  Make a grocery list and stick to it.  Cooking  Try to cook your favorite foods in a  healthier way. For example, try baking instead of frying.  Use low-fat dairy products. Meal planning  Use more fruits and vegetables. Half of your plate should be fruits and vegetables.  Include lean proteins like poultry and fish. How do I count calories when eating out?  Ask for smaller portion sizes.  Consider sharing an entree and sides instead of getting your own entree.  If you get your own entree, eat only half. Ask for a box at the beginning of your meal and put the rest of your entree in it so you are not tempted to eat it.  If calories are listed on the menu, choose the lower calorie options.  Choose dishes that include vegetables, fruits, whole grains, low-fat dairy products, and lean protein.  Choose items that are boiled, broiled, grilled, or steamed. Stay away from items that are buttered, battered, fried, or served with cream sauce. Items labeled "crispy" are usually fried, unless stated otherwise.  Choose water, low-fat milk, unsweetened iced tea, or other drinks without added sugar. If you want an alcoholic beverage, choose a lower calorie option such as a glass of wine or light beer.  Ask for dressings, sauces, and syrups on the side. These are usually high in calories, so you should limit the amount you eat.  If you want a salad, choose a garden salad and ask for grilled meats. Avoid extra toppings like bacon, cheese, or fried items. Ask for the dressing on the side, or ask for olive oil and vinegar or lemon to use as dressing.  Estimate how many servings of a food you are given. For example, a serving of cooked rice is  cup or about the size of half a baseball. Knowing serving sizes will help you be aware of how much food you are eating at restaurants. The list below tells you how big or small some common portion sizes are based on everyday objects: ? 1 oz--4 stacked dice. ? 3 oz--1 deck of cards. ? 1 tsp--1 die. ? 1 Tbsp-- a ping-pong ball. ? 2 Tbsp--1  ping-pong ball. ?  cup-- baseball. ? 1 cup--1 baseball. Summary  Calorie counting means keeping track of how many calories you eat and drink each day. If you eat fewer calories than your body needs, you should lose weight.  A healthy amount of weight to lose per week is usually 1-2 lb (0.5-0.9 kg). This usually means reducing your daily calorie intake by 500-750 calories.  The number of calories in a food can be found on a Nutrition Facts label. If a food does not have a Nutrition Facts label, try to look up the calories online or ask your dietitian for help.  Use your calories on foods and drinks that will fill you up, and not on foods and drinks that will leave you hungry.  Use smaller plates, glasses, and bowls to prevent overeating. This information is not intended to replace advice given to you by your health care provider. Make sure you discuss any questions you have with your health care provider. Document Released: 08/26/2005 Document Revised: 05/15/2018 Document Reviewed: 07/26/2016 Elsevier Patient Education  2020 [employer].

## 2019-06-14 NOTE — Progress Notes (Signed)
63 y.o. G0P0000 Divorced  African American Fe here for annual exam.  Menopausal no HRT. Has had weight gain and not active during the Covid 19. Has been referred for PCP in [place], [place], but no one has followed up with this. She prefers to be in [facility] network due colon cancer. She plans to call oncology for referral again. Still seeing oncology for treatment. No other health issues today.  Patient's last menstrual period was 07/10/2001 (lmp unknown).          Sexually active: No.  The current method of family planning is status post hysterectomy.    Exercising: No.  exercise Smoker:  no  Review of Systems  Constitutional: Negative.   HENT: Negative.   Eyes: Negative.   Respiratory: Negative.   Cardiovascular: Negative.   Gastrointestinal: Negative.   Genitourinary: Negative.   Musculoskeletal: Negative.   Skin: Negative.   Neurological: Negative.   Endo/Heme/Allergies: Negative.   Psychiatric/Behavioral: Negative.     Health Maintenance: Pap:  11-21-04 neg, 04-30-18 neg HPV HR neg History of Abnormal Pap: no MMG:  05-27-18 category b density birads 1:neg Self Breast exams: occ Colonoscopy:  2019 (hx of colon cancer) BMD:   2016 TDaP:  2014 Shingles: not done Pneumonia: not done Hep C and HIV: hep c neg 2017 Labs: no   reports that she has never smoked. She has never used smokeless tobacco. She reports previous alcohol use. She reports that she does not use drugs.  Past Medical History:  Diagnosis Date  . Anemia   . Asthma   . Cancer [facility])    colon cancer  . Colon polyp   . Fibroid   . GERD (gastroesophageal reflux disease)   . Heart murmur    per patient " dr white told me i had a murmur but i told him they told me that when i was a kid and it had never bothered me "   . Hypertension   . Sleep apnea     Past Surgical History:  Procedure Laterality Date  . ABDOMINAL HYSTERECTOMY     2002  . COLONOSCOPY N/A 09/18/2017   Procedure: COLONOSCOPY;   Surgeon: Ileana Roup, MD;  Location: [facility];  Service: General;  Laterality: N/A;  . COLONOSCOPY WITH PROPOFOL N/A 06/30/2018   Procedure: COLONOSCOPY WITH PROPOFOL;  Surgeon: Juanita Craver, MD;  Location: [facility];  Service: Endoscopy;  Laterality: N/A;  . LAPAROSCOPIC RIGHT HEMI COLECTOMY Right 09/18/2017   Procedure: LAPAROSCOPIC RIGHT HEMI COLECTOMY ERAS PATHWAY;  Surgeon: Ileana Roup, MD;  Location: [facility];  Service: General;  Laterality: Right;  . MYOMECTOMY  DONE 5 YEARS PRIOR TO HYSTERECTOMY   multiple fibroids  . POLYPECTOMY  06/30/2018   Procedure: POLYPECTOMY;  Surgeon: Juanita Craver, MD;  Location: [facility];  Service: Endoscopy;;  . PORTACATH PLACEMENT N/A 10/17/2017   Procedure: INSERTION PORT-A-CATH;  Surgeon: Ileana Roup, MD;  Location: [facility];  Service: General;  Laterality: N/A;    Current Outpatient Medications  Medication Sig Dispense Refill  . azelastine (ASTELIN) 0.1 % nasal spray Place 1 spray into both nostrils 2 (two) times daily. Use in each nostril as directed 30 mL 5  . budesonide-formoterol (SYMBICORT) 160-4.5 MCG/ACT inhaler Inhale 2 puffs into the lungs 2 (two) times daily. 10.2 g 5  . Cholecalciferol (VITAMIN D3) 1000 units CAPS Take 1,000 Units by mouth daily.     . diphenhydrAMINE (BENADRYL) 25 MG tablet Take 50 mg by mouth daily as needed  for itching.    . fluticasone (FLONASE) 50 MCG/ACT nasal spray Place 2 sprays into both nostrils daily. 16 g 5  . ketotifen (ZADITOR) 0.025 % ophthalmic solution Place 1 drop into both eyes daily as needed (allergies).     . lansoprazole (PREVACID) 15 MG capsule Take 15 mg by mouth daily as needed (acid reflux).     Marland Kitchen levocetirizine (XYZAL) 5 MG tablet Take 1 tablet (5 mg total) by mouth every evening. 30 tablet 5  . lidocaine-prilocaine (EMLA) cream Apply to portacath 1-2 hours prior to use 30 g 2  . montelukast (SINGULAIR) 10 MG tablet Take 1 tablet (10 mg total) by mouth at bedtime. 30 tablet  5  . Propylene Glycol (SYSTANE BALANCE) 0.6 % SOLN Place 1 drop into both eyes daily as needed (dry eyes).    . predniSONE (DELTASONE) 50 MG tablet Take one tablet 13 hours, 7 hours and 1 hour prior to CT scan (Patient not taking: Reported on 06/14/2019) 3 tablet 0   No current facility-administered medications for this visit.    Facility-Administered Medications Ordered in Other Visits  Medication Dose Route Frequency Provider Last Rate Last Dose  . famotidine (PEPCID) IVPB 20 mg premix  20 mg Intravenous Q12H Tanner, Lucianne Lei E., PA-C   Stopped at 01/21/18 1528  . heparin lock flush 100 unit/mL  500 Units Intracatheter Once PRN Betsy Coder B, MD      . sodium chloride flush (NS) 0.9 % injection 10 mL  10 mL Intracatheter PRN Ladell Pier, MD        Family History  Problem Relation Age of Onset  . Hypertension Mother   . Leukemia Mother   . Lupus Father   . Hodgkin's lymphoma Maternal Grandmother   . Cancer Maternal Grandfather        prostate  . Leukemia Sister   . Kidney disease Brother   . Leukemia Brother   . Allergic rhinitis Neg Hx   . Asthma Neg Hx   . Eczema Neg Hx   . Urticaria Neg Hx     ROS:  Pertinent items are noted in HPI.  Otherwise, a comprehensive ROS was negative.  Exam:   BP 122/70   Pulse 70   Temp (!) 97.2 F (36.2 C) (Skin)   Resp 16   Ht 5\' 2"  (1.575 m)   Wt 248 lb (112.5 kg)   LMP 07/10/2001 (LMP Unknown)   BMI 45.36 kg/m  Height: 5\' 2"  (157.5 cm) Ht Readings from Last 3 Encounters:  06/14/19 5\' 2"  (1.575 m)  02/11/19 5\' 3"  (1.6 m)  08/27/18 5\' 3"  (1.6 m)    General appearance: alert, cooperative and appears stated age Head: Normocephalic, without obvious abnormality, atraumatic Neck: no adenopathy, supple, symmetrical, trachea midline and thyroid normal to inspection and palpation Lungs: clear to auscultation bilaterally Breasts: normal appearance, no masses or tenderness, No nipple retraction or dimpling, No nipple discharge or  bleeding, No axillary or supraclavicular adenopathy, right breast  mass at 3 FB from aerola edge at 9 o'clock holding breast symmetrical, non tender, not mobile, firm 2 cm feel Heart: regular rate and rhythm Abdomen: soft, non-tender; no masses,  no organomegaly Extremities: extremities normal, atraumatic, no cyanosis or edema Skin: Skin color, texture, turgor normal. No rashes or lesions Lymph nodes: Cervical, supraclavicular, and axillary nodes normal. No abnormal inguinal nodes palpated Neurologic: Grossly normal   Pelvic: External genitalia:  no lesions  Urethra:  normal appearing urethra with no masses, tenderness or lesions              Bartholin's and Skene's: normal                 Vagina: normal appearing vagina with normal color and discharge, no lesions              Cervix: absent              Pap taken: No. Bimanual Exam:  Uterus:  uterus absent              Adnexa: no mass, fullness, tenderness               Rectovaginal: Confirms               Anus:  normal sphincter tone, no lesions  Chaperone present: yes  A:  Well Woman with normal exam  Post menopausal no HRT  Right breast mass  History of colon cancer under oncology management ( still has port), had labs  Obesity with weight gain  Mammogram due  BMD due   P:   Reviewed health and wellness pertinent to exam  Discussed breast mass finding and need to evaluate with diagnostic mammogram and [place]. Patient agreeable. She will be called with appointment.  Stressed importance of follow up with oncology  Discussed weight management with portion control and regular walking exercise.  Order placed for BMD.  Pap smear: no   counseled on breast self exam, mammography screening, feminine hygiene, menopause, adequate intake of calcium and vitamin D, diet and exercise return annually or prn   An After Visit Summary was printed and given to the patient.

## 2019-06-18 ENCOUNTER — Other Ambulatory Visit: Payer: Self-pay

## 2019-06-18 ENCOUNTER — Inpatient Hospital Stay: Payer: BC Managed Care – PPO | Attending: Oncology

## 2019-06-18 DIAGNOSIS — T451X5A Adverse effect of antineoplastic and immunosuppressive drugs, initial encounter: Secondary | ICD-10-CM | POA: Insufficient documentation

## 2019-06-18 DIAGNOSIS — Z79899 Other long term (current) drug therapy: Secondary | ICD-10-CM | POA: Insufficient documentation

## 2019-06-18 DIAGNOSIS — C180 Malignant neoplasm of cecum: Secondary | ICD-10-CM | POA: Diagnosis present

## 2019-06-18 DIAGNOSIS — D6959 Other secondary thrombocytopenia: Secondary | ICD-10-CM | POA: Insufficient documentation

## 2019-06-18 DIAGNOSIS — J45909 Unspecified asthma, uncomplicated: Secondary | ICD-10-CM | POA: Diagnosis not present

## 2019-06-18 DIAGNOSIS — Z452 Encounter for adjustment and management of vascular access device: Secondary | ICD-10-CM | POA: Insufficient documentation

## 2019-06-18 DIAGNOSIS — Z95828 Presence of other vascular implants and grafts: Secondary | ICD-10-CM

## 2019-06-18 MED ORDER — SODIUM CHLORIDE 0.9% FLUSH
10.0000 mL | Freq: Once | INTRAVENOUS | Status: AC
  Administered 2019-06-18: 10 mL
  Filled 2019-06-18: qty 10

## 2019-06-18 MED ORDER — HEPARIN SOD (PORK) LOCK FLUSH 100 UNIT/ML IV SOLN
500.0000 [IU] | Freq: Once | INTRAVENOUS | Status: AC
  Administered 2019-06-18: 500 [IU]
  Filled 2019-06-18: qty 5

## 2019-06-30 ENCOUNTER — Other Ambulatory Visit: Payer: Self-pay | Admitting: Certified Nurse Midwife

## 2019-06-30 ENCOUNTER — Telehealth: Payer: Self-pay | Admitting: Certified Nurse Midwife

## 2019-06-30 DIAGNOSIS — Z780 Asymptomatic menopausal state: Secondary | ICD-10-CM

## 2019-06-30 DIAGNOSIS — N6310 Unspecified lump in the right breast, unspecified quadrant: Secondary | ICD-10-CM

## 2019-06-30 DIAGNOSIS — Z1231 Encounter for screening mammogram for malignant neoplasm of breast: Secondary | ICD-10-CM

## 2019-06-30 NOTE — Telephone Encounter (Addendum)
Spoke with Sharon Allison at [facility]. Request order for Dx MMG, patient called to schedule screening, states she needs a Dx MMG.   Reviewed 06/14/19 OV notes:  Breasts: normal appearance, no masses or tenderness, No nipple retraction or dimpling, No nipple discharge or bleeding, No axillary or supraclavicular adenopathy, right breast  mass at 3 FB from aerola edge at 9 o'clock holding breast symmetrical, non tender, not mobile, firm 2 cm feel  Discussed breast mass finding and need to evaluate with diagnostic mammogram and [place]. Patient agreeable.   Order placed for bilateral Dx MMG and right breast US, if needed.   Patient placed in MMG hold.   Sharon Allison will contact the patient back to schedule Dx MMG.

## 2019-06-30 NOTE — Telephone Encounter (Signed)
Routing to [employer], CNM.   Encounter closed.

## 2019-06-30 NOTE — Telephone Encounter (Signed)
[facility]. Patient said when she was in to see Sharon Allison she found "something" in her breast. [phone]

## 2019-07-04 ENCOUNTER — Other Ambulatory Visit: Payer: Self-pay | Admitting: Allergy

## 2019-07-04 DIAGNOSIS — J4520 Mild intermittent asthma, uncomplicated: Secondary | ICD-10-CM

## 2019-07-08 ENCOUNTER — Other Ambulatory Visit: Payer: BC Managed Care – PPO

## 2019-07-14 ENCOUNTER — Ambulatory Visit: Payer: BC Managed Care – PPO | Admitting: Allergy

## 2019-07-14 ENCOUNTER — Encounter: Payer: Self-pay | Admitting: Allergy

## 2019-07-14 ENCOUNTER — Other Ambulatory Visit: Payer: Self-pay

## 2019-07-14 ENCOUNTER — Other Ambulatory Visit: Payer: Self-pay | Admitting: Allergy

## 2019-07-14 VITALS — BP 148/82 | HR 80 | Temp 97.9°F | Resp 16 | Ht 62.5 in | Wt 247.4 lb

## 2019-07-14 DIAGNOSIS — J4520 Mild intermittent asthma, uncomplicated: Secondary | ICD-10-CM | POA: Insufficient documentation

## 2019-07-14 DIAGNOSIS — J310 Chronic rhinitis: Secondary | ICD-10-CM | POA: Insufficient documentation

## 2019-07-14 DIAGNOSIS — J4540 Moderate persistent asthma, uncomplicated: Secondary | ICD-10-CM

## 2019-07-14 MED ORDER — MONTELUKAST SODIUM 10 MG PO TABS: 10.0000 mg | ORAL_TABLET | Freq: Every day | ORAL | 5 refills | Status: DC

## 2019-07-14 MED ORDER — BUDESONIDE-FORMOTEROL FUMARATE 160-4.5 MCG/ACT IN AERO: 2.0000 | INHALATION_SPRAY | Freq: Two times a day (BID) | RESPIRATORY_TRACT | 5 refills | Status: DC

## 2019-07-14 MED ORDER — ALBUTEROL SULFATE HFA 108 (90 BASE) MCG/ACT IN AERS: 2.0000 | INHALATION_SPRAY | RESPIRATORY_TRACT | 1 refills | Status: DC | PRN

## 2019-07-14 MED ORDER — KETOTIFEN FUMARATE 0.025 % OP SOLN: 1.0000 [drp] | Freq: Two times a day (BID) | OPHTHALMIC | 5 refills | Status: DC | PRN

## 2019-07-14 MED ORDER — AZELASTINE HCL 0.1 % NA SOLN: 2.0000 | Freq: Two times a day (BID) | NASAL | 5 refills | Status: DC | PRN

## 2019-07-14 MED ORDER — FLUTICASONE PROPIONATE 50 MCG/ACT NA SUSP: 2.0000 | Freq: Every day | NASAL | 5 refills | Status: DC

## 2019-07-14 MED ORDER — LEVOCETIRIZINE DIHYDROCHLORIDE 5 MG PO TABS: 5.0000 mg | ORAL_TABLET | Freq: Every evening | ORAL | 5 refills | Status: DC

## 2019-07-14 NOTE — Telephone Encounter (Signed)
Call to pharmacy, spoke with a Tasha, she was able to get the Symbicort to go through for $47 dollars.  Had to use [facility] for her prescription.

## 2019-07-14 NOTE — Telephone Encounter (Signed)
Can you check what's on the formulary?  Dulera? Advair? Wixela? Breo?

## 2019-07-14 NOTE — Assessment & Plan Note (Signed)
Stable with below regimen.  . 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  . Daily controller medication(s): continue Symbicort 160 2 puffs twice a day. . Prior to physical activity: May use albuterol rescue inhaler 2 puffs 5 to 15 minutes prior to strenuous physical activities. Marland Kitchen Rescue medications: May use albuterol rescue inhaler 2 puffs or nebulizer every 4 to 6 hours as needed for shortness of breath, chest tightness, coughing, and wheezing. Monitor frequency of use.  . During upper respiratory infections/asthma flares: Start Singulair 10mg  daily.

## 2019-07-14 NOTE — Assessment & Plan Note (Signed)
Uses medications as needed with good benefit.  May use over the counter antihistamines such as Zyrtec (cetirizine), Claritin (loratadine), Allegra (fexofenadine), or Xyzal (levocetirizine) daily as needed.  May use Flonase 2 sprays per nostril daily for nasal congestion.  May use azelastine nasal spray 1-2 spays per nostril 1-2 times a day as needed for drainage.  May use Zaditor eye drops as needed 1-2 times a day for itchy/watery eyes.

## 2019-07-14 NOTE — Patient Instructions (Addendum)
Asthma: . Daily controller medication(s): continue Symbicort 160 2 puffs twice a day. . Prior to physical activity: May use albuterol rescue inhaler 2 puffs 5 to 15 minutes prior to strenuous physical activities. Marland Kitchen Rescue medications: May use albuterol rescue inhaler 2 puffs or nebulizer every 4 to 6 hours as needed for shortness of breath, chest tightness, coughing, and wheezing. Monitor frequency of use.  . During upper respiratory infections/asthma flares: Start Singulair 10mg  daily.  . Asthma control goals:  o Full participation in all desired activities (may need albuterol before activity) o Albuterol use two times or less a week on average (not counting use with activity) o Cough interfering with sleep two times or less a month o Oral steroids no more than once a year o No hospitalizations  Environmental allergies:  May use over the counter antihistamines such as Zyrtec (cetirizine), Claritin (loratadine), Allegra (fexofenadine), or Xyzal (levocetirizine) daily as needed.  May use Flonase 2 sprays per nostril daily for nasal congestion.  May use azelastine nasal spray 1-2 spays per nostril 1-2 times a day as needed for drainage.  May use Zaditor eye drops as needed 1-2 times a day for itchy/watery eyes.   Follow up in 6 months or sooner if needed.

## 2019-07-14 NOTE — Progress Notes (Signed)
Follow Up Note  RE: Sharon Allison MRN: [id number] DOB: [birth year] Date of Office Visit: 07/14/2019  Referring provider: Regina Eck, CNM Primary care provider: Regina Eck, CNM  Chief Complaint: Asthma (no concerns)  History of Present Illness: I had the pleasure of seeing Sharon Allison for a follow up visit at the [facility] on 07/14/2019. She is a 63 y.o. female, who is being followed for asthma, allergic rhinoconjunctivitis, obstructive sleep apnea. Today she is here for regular follow up visit. Her previous allergy office visit was on 07/01/2018 with Dr. Nelva Bush.   Asthma: Currently on Symbicort 160 2 puffs twice a day and Singulair 10mg  daily prn.  Denies any SOB, coughing, wheezing, chest tightness, nocturnal awakenings, ER/urgent care visits or prednisone use since the last visit. No albuterol use since the last visit.  There was one year when she ran out of Symbicort and ended up in the ER.   Allergic rhinoconjunctivitis Taking Singulair as needed and antihistamines as needed with good benefit. Uses Flonase and Astelin as needed.  Using eye drops as needed with good benefit.  Patient had colon cancer and now in remission. She still has port in place.   Assessment and Plan: Sharon Allison is a 63 y.o. female with: Moderate persistent asthma without complication Stable with below regimen.  . 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  . Daily controller medication(s): continue Symbicort 160 2 puffs twice a day. . Prior to physical activity: May use albuterol rescue inhaler 2 puffs 5 to 15 minutes prior to strenuous physical activities. Marland Kitchen Rescue medications: May use albuterol rescue inhaler 2 puffs or nebulizer every 4 to 6 hours as needed for shortness of breath, chest tightness, coughing, and wheezing. Monitor frequency of use.  . During upper respiratory infections/asthma flares: Start Singulair 10mg  daily.   Chronic rhinitis Uses medications as needed  with good benefit.  May use over the counter antihistamines such as Zyrtec (cetirizine), Claritin (loratadine), Allegra (fexofenadine), or Xyzal (levocetirizine) daily as needed.  May use Flonase 2 sprays per nostril daily for nasal congestion.  May use azelastine nasal spray 1-2 spays per nostril 1-2 times a day as needed for drainage.  May use Zaditor eye drops as needed 1-2 times a day for itchy/watery eyes.   Return in about 6 months (around 01/11/2020).  Meds ordered this encounter  Medications  . budesonide-formoterol (SYMBICORT) 160-4.5 MCG/ACT inhaler    Sig: Inhale 2 puffs into the lungs 2 (two) times daily.    Dispense:  10.2 g    Refill:  5  . azelastine (ASTELIN) 0.1 % nasal spray    Sig: Place 2 sprays into both nostrils 2 (two) times daily as needed for rhinitis. Use in each nostril as directed    Dispense:  30 mL    Refill:  5  . ketotifen (ZADITOR) 0.025 % ophthalmic solution    Sig: Place 1 drop into both eyes 2 (two) times daily as needed (allergies).    Dispense:  5 mL    Refill:  5  . levocetirizine (XYZAL) 5 MG tablet    Sig: Take 1 tablet (5 mg total) by mouth every evening.    Dispense:  30 tablet    Refill:  5  . montelukast (SINGULAIR) 10 MG tablet    Sig: Take 1 tablet (10 mg total) by mouth at bedtime.    Dispense:  30 tablet    Refill:  5  . fluticasone (FLONASE) 50 MCG/ACT nasal spray  Sig: Place 2 sprays into both nostrils daily. As needed for nasal congestion.    Dispense:  16 g    Refill:  5  . albuterol (VENTOLIN HFA) 108 (90 Base) MCG/ACT inhaler    Sig: Inhale 2 puffs into the lungs every 4 (four) hours as needed for wheezing or shortness of breath.    Dispense:  18 g    Refill:  1   Diagnostics: Spirometry:  Tracings reviewed. Her effort: Good reproducible efforts. FVC: 2.12L FEV1: 1.78L, 92% predicted FEV1/FVC ratio: 84% Interpretation: Spirometry consistent with normal pattern.  Please see scanned spirometry results for details.   Medication List:  Current Outpatient Medications  Medication Sig Dispense Refill  . albuterol (VENTOLIN HFA) 108 (90 Base) MCG/ACT inhaler Inhale 2 puffs into the lungs every 4 (four) hours as needed for wheezing or shortness of breath. 18 g 1  . azelastine (ASTELIN) 0.1 % nasal spray Place 2 sprays into both nostrils 2 (two) times daily as needed for rhinitis. Use in each nostril as directed 30 mL 5  . budesonide-formoterol (SYMBICORT) 160-4.5 MCG/ACT inhaler Inhale 2 puffs into the lungs 2 (two) times daily. 10.2 g 5  . Cholecalciferol (VITAMIN D3) 1000 units CAPS Take 1,000 Units by mouth daily.     . diphenhydrAMINE (BENADRYL) 25 MG tablet Take 50 mg by mouth daily as needed for itching.    . fluticasone (FLONASE) 50 MCG/ACT nasal spray Place 2 sprays into both nostrils daily. As needed for nasal congestion. 16 g 5  . ketotifen (ZADITOR) 0.025 % ophthalmic solution Place 1 drop into both eyes 2 (two) times daily as needed (allergies). 5 mL 5  . lansoprazole (PREVACID) 15 MG capsule Take 15 mg by mouth daily as needed (acid reflux).     Marland Kitchen levocetirizine (XYZAL) 5 MG tablet Take 1 tablet (5 mg total) by mouth every evening. 30 tablet 5  . lidocaine-prilocaine (EMLA) cream Apply to portacath 1-2 hours prior to use 30 g 2  . montelukast (SINGULAIR) 10 MG tablet Take 1 tablet (10 mg total) by mouth at bedtime. 30 tablet 5  . Propylene Glycol (SYSTANE BALANCE) 0.6 % SOLN Place 1 drop into both eyes daily as needed (dry eyes).    . predniSONE (DELTASONE) 50 MG tablet Take one tablet 13 hours, 7 hours and 1 hour prior to CT scan (Patient not taking: Reported on 06/14/2019) 3 tablet 0   No current facility-administered medications for this visit.    Facility-Administered Medications Ordered in Other Visits  Medication Dose Route Frequency Provider Last Rate Last Dose  . famotidine (PEPCID) IVPB 20 mg premix  20 mg Intravenous Q12H Tanner, Lucianne Lei E., PA-C   Stopped at 01/21/18 1528  . heparin lock  flush 100 unit/mL  500 Units Intracatheter Once PRN Betsy Coder B, MD      . sodium chloride flush (NS) 0.9 % injection 10 mL  10 mL Intracatheter PRN Ladell Pier, MD       Allergies: Allergies  Allergen Reactions  . Oxaliplatin Shortness Of Breath    Sweating   . Iohexol Swelling    Patient had swelling and itching to face in 1980's. Had 13 hour pre meds before scan 10/27/17  . Iodine Swelling  . Sulfa Antibiotics Itching   I reviewed her past medical history, social history, family history, and environmental history and no significant changes have been reported from her previous visit.  Review of Systems  Constitutional: Negative for appetite change, chills, fever and unexpected  weight change.  HENT: Negative for congestion and rhinorrhea.   Eyes: Negative for itching.  Respiratory: Negative for cough, chest tightness, shortness of breath and wheezing.   Gastrointestinal: Negative for abdominal pain.  Skin: Negative for rash.  Neurological: Negative for headaches.   Objective: BP (!) 148/82 (BP Location: Left Arm, Patient Position: Sitting, Cuff Size: Large)   Pulse 80   Temp 97.9 F (36.6 C) (Temporal)   Resp 16   Ht 5' 2.5" (1.588 m)   Wt 247 lb 6.4 oz (112.2 kg)   LMP 07/10/2001 (LMP Unknown)   SpO2 97%   BMI 44.53 kg/m  Body mass index is 44.53 kg/m. Physical Exam  Constitutional: She is oriented to person, place, and time. She appears well-developed and well-nourished.  HENT:  Head: Normocephalic and atraumatic.  Right Ear: External ear normal.  Left Ear: External ear normal.  Nose: Nose normal.  Mouth/Throat: Oropharynx is clear and moist.  Eyes: Conjunctivae and EOM are normal.  Neck: Neck supple.  Cardiovascular: Normal rate, regular rhythm and normal heart sounds. Exam reveals no gallop and no friction rub.  No murmur heard. Pulmonary/Chest: Effort normal and breath sounds normal. She has no wheezes. She has no rales.  Neurological: She is alert  and oriented to person, place, and time.  Skin: Skin is warm. No rash noted.  Psychiatric: She has a normal mood and affect. Her behavior is normal.  Nursing note and vitals reviewed.  Previous notes and tests were reviewed. The plan was reviewed with the patient/family, and all questions/concerned were addressed.  It was my pleasure to see Chella today and participate in her care. Please feel free to contact me with any questions or concerns.  Sincerely,  Rexene Alberts, DO Allergy & Immunology  [facility] office: [phone] [facility] office: [place] office: [phone]

## 2019-07-14 NOTE — Telephone Encounter (Signed)
Dr. Maudie Mercury can you please advise on alternative?

## 2019-07-19 ENCOUNTER — Ambulatory Visit
Admission: RE | Admit: 2019-07-19 | Discharge: 2019-07-19 | Disposition: A | Discharge status: Home / Self Care | Payer: BC Managed Care – PPO | Source: Ambulatory Visit | Attending: Certified Nurse Midwife | Admitting: Certified Nurse Midwife

## 2019-07-19 ENCOUNTER — Other Ambulatory Visit: Payer: Self-pay

## 2019-07-19 DIAGNOSIS — N6310 Unspecified lump in the right breast, unspecified quadrant: Secondary | ICD-10-CM

## 2019-07-20 ENCOUNTER — Telehealth: Payer: Self-pay | Admitting: *Deleted

## 2019-07-20 NOTE — Telephone Encounter (Signed)
Spoke with patient, advised per Deborah Leonard, CNM. Patient verbalizes understanding and is agreeable.   Encounter closed.  

## 2019-07-20 NOTE — Telephone Encounter (Signed)
Notes recorded by Burnice Logan, RN on 07/20/2019 at 9:26 AM EST  Left message to call Sharee Pimple, RN at [facility].   Patient removed from [facility] hold.  ------

## 2019-07-20 NOTE — Telephone Encounter (Signed)
-----   Message from Regina Eck, CNM sent at 07/20/2019  7:30 AM EST ----- Notify patient that the area noted in right is a large oil cyst, no concerning finding on [place] or mammogram. This is the area we were concerned about. Continue SBE monthly and mammogram yearly.

## 2019-07-23 ENCOUNTER — Other Ambulatory Visit: Payer: Self-pay | Admitting: *Deleted

## 2019-07-23 DIAGNOSIS — C182 Malignant neoplasm of ascending colon: Secondary | ICD-10-CM

## 2019-07-23 MED ORDER — PREDNISONE 50 MG PO TABS: ORAL_TABLET | ORAL | 0 refills | Status: DC

## 2019-07-23 NOTE — Progress Notes (Signed)
Patient called to request prednisone be called in for her CT scan. This was done and confirmed with her she will take a 50mg  OTC Benadryl with last prednisone.

## 2019-08-13 ENCOUNTER — Encounter (HOSPITAL_COMMUNITY): Payer: Self-pay

## 2019-08-13 ENCOUNTER — Inpatient Hospital Stay: Payer: BC Managed Care – PPO | Attending: Oncology

## 2019-08-13 ENCOUNTER — Inpatient Hospital Stay: Payer: BC Managed Care – PPO

## 2019-08-13 ENCOUNTER — Other Ambulatory Visit: Payer: Self-pay

## 2019-08-13 ENCOUNTER — Ambulatory Visit (HOSPITAL_COMMUNITY)
Admission: RE | Admit: 2019-08-13 | Discharge: 2019-08-13 | Disposition: A | Discharge status: Home / Self Care | Payer: BC Managed Care – PPO | Source: Ambulatory Visit | Attending: Nurse Practitioner | Admitting: Nurse Practitioner

## 2019-08-13 DIAGNOSIS — Z79899 Other long term (current) drug therapy: Secondary | ICD-10-CM | POA: Diagnosis not present

## 2019-08-13 DIAGNOSIS — T451X5A Adverse effect of antineoplastic and immunosuppressive drugs, initial encounter: Secondary | ICD-10-CM | POA: Insufficient documentation

## 2019-08-13 DIAGNOSIS — C180 Malignant neoplasm of cecum: Secondary | ICD-10-CM | POA: Insufficient documentation

## 2019-08-13 DIAGNOSIS — R200 Anesthesia of skin: Secondary | ICD-10-CM | POA: Diagnosis not present

## 2019-08-13 DIAGNOSIS — C182 Malignant neoplasm of ascending colon: Secondary | ICD-10-CM | POA: Insufficient documentation

## 2019-08-13 DIAGNOSIS — J45909 Unspecified asthma, uncomplicated: Secondary | ICD-10-CM | POA: Diagnosis not present

## 2019-08-13 DIAGNOSIS — D123 Benign neoplasm of transverse colon: Secondary | ICD-10-CM | POA: Insufficient documentation

## 2019-08-13 DIAGNOSIS — Z95828 Presence of other vascular implants and grafts: Secondary | ICD-10-CM

## 2019-08-13 DIAGNOSIS — K760 Fatty (change of) liver, not elsewhere classified: Secondary | ICD-10-CM | POA: Diagnosis not present

## 2019-08-13 DIAGNOSIS — D696 Thrombocytopenia, unspecified: Secondary | ICD-10-CM | POA: Diagnosis not present

## 2019-08-13 DIAGNOSIS — T7840XA Allergy, unspecified, initial encounter: Secondary | ICD-10-CM | POA: Diagnosis not present

## 2019-08-13 DIAGNOSIS — R197 Diarrhea, unspecified: Secondary | ICD-10-CM | POA: Insufficient documentation

## 2019-08-13 HISTORY — DX: Type 2 diabetes mellitus without complications: E11.9

## 2019-08-13 LAB — CBC WITH DIFFERENTIAL (CANCER CENTER ONLY)
Abs Immature Granulocytes: 0.1 10*3/uL — ABNORMAL HIGH (ref 0.00–0.07)
Basophils Absolute: 0 10*3/uL (ref 0.0–0.1)
Basophils Relative: 0 %
Eosinophils Absolute: 0 10*3/uL (ref 0.0–0.5)
Eosinophils Relative: 0 %
HCT: 41.5 % (ref 36.0–46.0)
Hemoglobin: 13.9 g/dL (ref 12.0–15.0)
Immature Granulocytes: 1 %
Lymphocytes Relative: 13 %
Lymphs Abs: 1.3 10*3/uL (ref 0.7–4.0)
MCH: 30.3 pg (ref 26.0–34.0)
MCHC: 33.5 g/dL (ref 30.0–36.0)
MCV: 90.4 fL (ref 80.0–100.0)
Monocytes Absolute: 0.1 10*3/uL (ref 0.1–1.0)
Monocytes Relative: 1 %
Neutro Abs: 8.4 10*3/uL — ABNORMAL HIGH (ref 1.7–7.7)
Neutrophils Relative %: 85 %
Platelet Count: 291 10*3/uL (ref 150–400)
RBC: 4.59 MIL/uL (ref 3.87–5.11)
RDW: 12.9 % (ref 11.5–15.5)
WBC Count: 9.9 10*3/uL (ref 4.0–10.5)
nRBC: 0 % (ref 0.0–0.2)

## 2019-08-13 LAB — CMP (CANCER CENTER ONLY)
ALT: 32 U/L (ref 0–44)
AST: 26 U/L (ref 15–41)
Albumin: 4 g/dL (ref 3.5–5.0)
Alkaline Phosphatase: 93 U/L (ref 38–126)
Anion gap: 14 (ref 5–15)
BUN: 16 mg/dL (ref 8–23)
CO2: 21 mmol/L — ABNORMAL LOW (ref 22–32)
Calcium: 10.3 mg/dL (ref 8.9–10.3)
Chloride: 103 mmol/L (ref 98–111)
Creatinine: 0.78 mg/dL (ref 0.44–1.00)
GFR, Est AFR Am: >60 mL/min (ref >60)
GFR, Estimated: >60 mL/min (ref >60)
Glucose, Bld: 159 mg/dL — ABNORMAL HIGH (ref 70–99)
Potassium: 3.9 mmol/L (ref 3.5–5.1)
Sodium: 138 mmol/L (ref 135–145)
Total Bilirubin: 0.3 mg/dL (ref 0.3–1.2)
Total Protein: 7.6 g/dL (ref 6.5–8.1)

## 2019-08-13 MED ORDER — HEPARIN SOD (PORK) LOCK FLUSH 100 UNIT/ML IV SOLN
500.0000 [IU] | Freq: Once | INTRAVENOUS | Status: AC
  Administered 2019-08-13: 500 [IU] via INTRAVENOUS

## 2019-08-13 MED ORDER — IOHEXOL 300 MG/ML  SOLN
100.0000 mL | Freq: Once | INTRAMUSCULAR | Status: AC | PRN
  Administered 2019-08-13: 100 mL via INTRAVENOUS

## 2019-08-13 MED ORDER — SODIUM CHLORIDE (PF) 0.9 % IJ SOLN
INTRAMUSCULAR | Status: AC
  Filled 2019-08-13: qty 50

## 2019-08-13 MED ORDER — SODIUM CHLORIDE 0.9% FLUSH
10.0000 mL | Freq: Once | INTRAVENOUS | Status: AC
  Administered 2019-08-13: 10 mL
  Filled 2019-08-13: qty 10

## 2019-08-13 MED ORDER — HEPARIN SOD (PORK) LOCK FLUSH 100 UNIT/ML IV SOLN
INTRAVENOUS | Status: AC
  Administered 2019-08-13: 500 [IU]
  Filled 2019-08-13: qty 5

## 2019-08-16 LAB — CEA (IN HOUSE-CHCC): CEA (CHCC-In House): 1.28 ng/mL (ref 0.00–5.00)

## 2019-08-17 ENCOUNTER — Telehealth: Payer: Self-pay | Admitting: Oncology

## 2019-08-17 ENCOUNTER — Inpatient Hospital Stay: Payer: BC Managed Care – PPO | Admitting: Oncology

## 2019-08-17 ENCOUNTER — Other Ambulatory Visit: Payer: Self-pay

## 2019-08-17 VITALS — BP 150/70 | HR 86 | Temp 97.8°F | Resp 20 | Ht 62.5 in | Wt 248.0 lb

## 2019-08-17 DIAGNOSIS — C180 Malignant neoplasm of cecum: Secondary | ICD-10-CM | POA: Diagnosis not present

## 2019-08-17 DIAGNOSIS — C182 Malignant neoplasm of ascending colon: Secondary | ICD-10-CM | POA: Diagnosis not present

## 2019-08-17 NOTE — Progress Notes (Signed)
  [facility] OFFICE PROGRESS NOTE   Diagnosis: Colon cancer  INTERVAL HISTORY:   Sharon Allison returns as scheduled.  She feels well.  Good appetite.  No numbness in the hands.  She has mild numbness in the feet when at rest.  This does not interfere with activity.  Objective:  Vital signs in last 24 hours:  Blood pressure (!) 150/70, pulse 86, temperature 97.8 F (36.6 C), temperature source Temporal, resp. rate 20, height 5' 2.5" (1.588 m), weight 248 lb (112.5 kg), last menstrual period 07/10/2001, SpO2 98 %.    Limited physical examination secondary to distancing with the Covid Lymphatics: No cervical, supraclavicular, axillary, or inguinal nodes GI: No hepatomegaly, no mass, nontender Vascular: No leg edema    Portacath/PICC-without erythema  Lab Results:  Lab Results  Component Value Date   WBC 9.9 08/13/2019   HGB 13.9 08/13/2019   HCT 41.5 08/13/2019   MCV 90.4 08/13/2019   PLT 291 08/13/2019   NEUTROABS 8.4 (H) 08/13/2019    CMP  Lab Results  Component Value Date   NA 138 08/13/2019   K 3.9 08/13/2019   CL 103 08/13/2019   CO2 21 (L) 08/13/2019   GLUCOSE 159 (H) 08/13/2019   BUN 16 08/13/2019   CREATININE 0.78 08/13/2019   CALCIUM 10.3 08/13/2019   PROT 7.6 08/13/2019   ALBUMIN 4.0 08/13/2019   AST 26 08/13/2019   ALT 32 08/13/2019   ALKPHOS 93 08/13/2019   BILITOT 0.3 08/13/2019   GFRNONAA >60 08/13/2019   GFRAA >60 08/13/2019    Lab Results  Component Value Date   CEA1 1.28 08/13/2019    No results found for: INR  Imaging:  No results found.  Medications: I have reviewed the patient's current medications.   Assessment/Plan: 1. 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 MSI-stable ? 3/14 lymph nodes positive for metastatic carcinoma, lymphovascular invasion present ? Cycle 1 FOLFOX 10/22/2017 ? CTs chest, abdomen, and pelvis 10/28/2017-negative for metastatic disease ?  Cycle 2 FOLFOX 11/05/2017 ? Cycle 3 FOLFOX 11/27/2017 ? Cycle 4 FOLFOX 12/10/2017 ? Cycle 5 FOLFOX 12/24/2017 ? Cycle 6 FOLFOX 01/08/2018 (Oxaliplatin held due to thrombocytopenia) ? Cycle 7 FOLFOX 01/21/2018-oxaliplatin resumed ? Cycle 8 FOLFOX 02/04/2018-Oxaliplatin eliminated due to an allergic reaction with cycle 7 ? Cycle 9 FOLFOX6/26/2019-no Oxaliplatin; 5-FU bolus eliminated, infusional 5-FU dose reduced ? Cycle 10 FOLFOX 03/17/2018 ? Cycle 11 FOLFOX 03/31/2018 ? Cycle 12 FOLFOX 04/14/2018 ? Surveillance colonoscopy 06/30/2018-tubular adenoma removed from the transverse colon ? CTs 08/25/2018-negative ? CTs 08/13/2019-no evidence of recurrent colon cancer, focal fatty change in the left liver  2.Asthma  3.Port-A-Cath placement 10/17/2017  4.History of neutropenia secondary to chemotherapy-resolved  5.History of thrombocytopenia secondary to chemotherapy  6.Allergic reaction to Oxaliplatin 01/21/2018-pruritus, tachycardia, diaphoresis, hypotension  7.Diarrhea following cycle 8 FOLFOX-resolved, 5-FU bolus eliminated and infusion dose decreased beginning with cycle 9     Disposition: [name] is in remission from colon cancer.  She will return for an office visit and CEA in 6 months.  She would like to keep the Port-A-Cath in place.  She does not wish to have the Port-A-Cath removed during the Covid pandemic.  She will return for a Port-A-Cath flush in 8 weeks and 16 weeks.    Betsy Coder, MD  08/17/2019  11:52 AM

## 2019-08-17 NOTE — Telephone Encounter (Signed)
Scheduled appt per 12/8 los - gave patient AVS and calender per los.   

## 2019-09-20 ENCOUNTER — Other Ambulatory Visit: Payer: Self-pay | Admitting: Certified Nurse Midwife

## 2019-09-20 DIAGNOSIS — Z780 Asymptomatic menopausal state: Secondary | ICD-10-CM

## 2019-09-21 ENCOUNTER — Other Ambulatory Visit: Payer: Self-pay

## 2019-09-21 ENCOUNTER — Ambulatory Visit
Admission: RE | Admit: 2019-09-21 | Discharge: 2019-09-21 | Disposition: A | Discharge status: Home / Self Care | Payer: BC Managed Care – PPO | Source: Ambulatory Visit | Attending: Certified Nurse Midwife | Admitting: Certified Nurse Midwife

## 2019-09-21 DIAGNOSIS — Z780 Asymptomatic menopausal state: Secondary | ICD-10-CM

## 2019-10-12 ENCOUNTER — Inpatient Hospital Stay: Payer: BC Managed Care – PPO | Attending: Oncology

## 2019-10-12 ENCOUNTER — Other Ambulatory Visit: Payer: Self-pay

## 2019-10-12 DIAGNOSIS — Z95828 Presence of other vascular implants and grafts: Secondary | ICD-10-CM

## 2019-10-12 DIAGNOSIS — C182 Malignant neoplasm of ascending colon: Secondary | ICD-10-CM | POA: Insufficient documentation

## 2019-10-12 DIAGNOSIS — Z452 Encounter for adjustment and management of vascular access device: Secondary | ICD-10-CM | POA: Diagnosis not present

## 2019-10-12 MED ORDER — SODIUM CHLORIDE 0.9% FLUSH
10.0000 mL | Freq: Once | INTRAVENOUS | Status: AC
  Administered 2019-10-12: 12:00:00 10 mL
  Filled 2019-10-12: qty 10

## 2019-10-12 MED ORDER — HEPARIN SOD (PORK) LOCK FLUSH 100 UNIT/ML IV SOLN
500.0000 [IU] | Freq: Once | INTRAVENOUS | Status: AC
  Administered 2019-10-12: 12:00:00 500 [IU]
  Filled 2019-10-12: qty 5

## 2019-10-13 ENCOUNTER — Telehealth: Payer: Self-pay | Admitting: *Deleted

## 2019-10-13 NOTE — Telephone Encounter (Addendum)
[place] STUDY; Called patient to complete 24 month follow up on above study. Patient has completed chemotherapy with curative intent and no further treatment planned at this time for her stage 3 colon cancer.Dr. Gearldine Shown most recent note states she is in remission.  Patient states she feels she has recovered from treatment with some residual mild neuropathy.  She is able to perform all of her ADLs and instrumental ADLs as usual.Patient is working part time in an office.RN assessed ECOG level as 0 and Dr. Benay Spice agrees.     Patientdenies any changes to her insurance since beginning on study. Informed patient she should be receiving email from the study to complete the last questionnaire.  Thanked patient for her participation in this study. After she completes this last questionnaire she will have completed this study. She verbalized understanding.  Foye Spurling, BSN, RN Clinical Research Nurse 10/13/2019 11:12 AM

## 2019-11-11 IMAGING — CT CT ABD-PELV W/ CM
3 of 5 series · 15 of 36 positions shown, 18 images · IV contrast (APPLIED)
Comparison: 10/27/2017

CLINICAL DATA: Ascending colon cancer, status post resection and
chemotherapy

EXAM:
CT CHEST, ABDOMEN, AND PELVIS WITH CONTRAST
TECHNIQUE: Multidetector CT imaging of the chest, abdomen and pelvis was
performed following the standard protocol during bolus
administration of intravenous contrast.
CONTRAST:  100mL OMNIPAQUE IOHEXOL 300 MG/ML  SOLN

[Series 2: cap with · axial · 0.98mm/px · z∈[-451,+39]mm · 9 of 124 slices shown, 12 images]
[im 13/124  mediastinal]
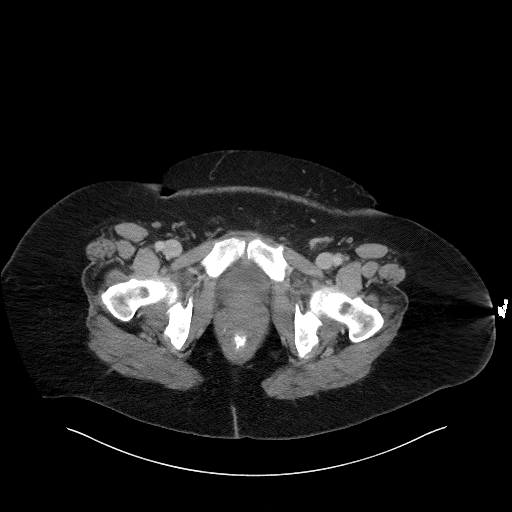
[im 13/124  lung]
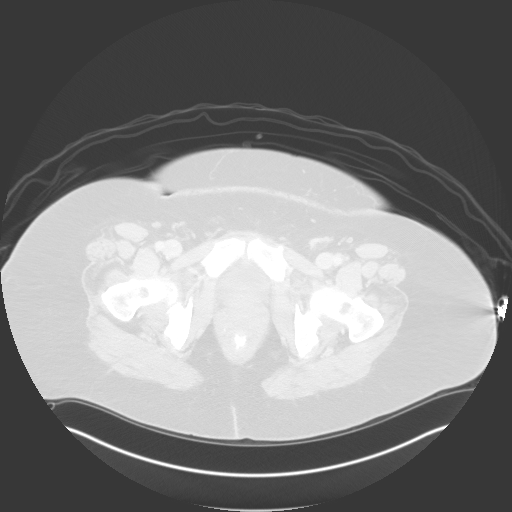
[im 25/124  lung]
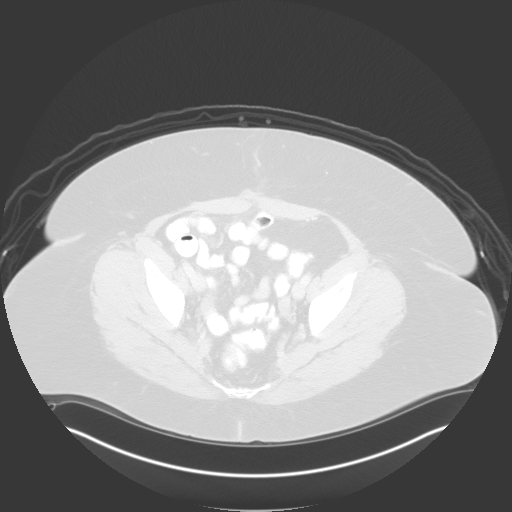
[im 37/124  lung]
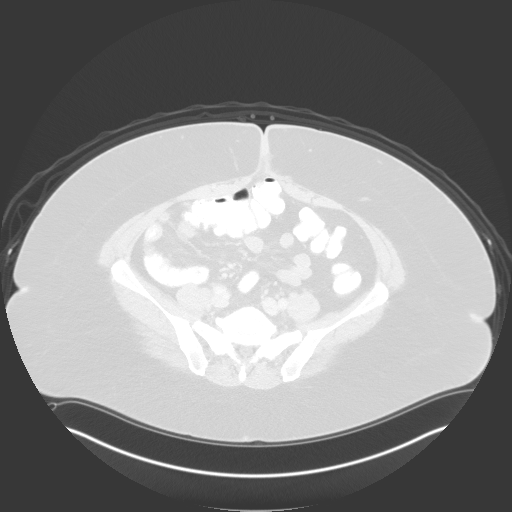
[im 50/124  lung]
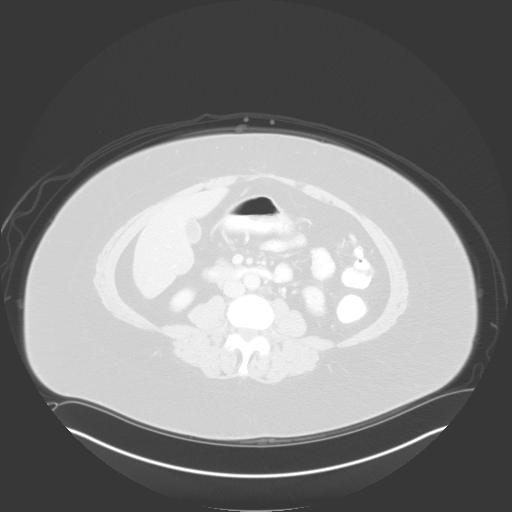
[im 62/124  mediastinal]
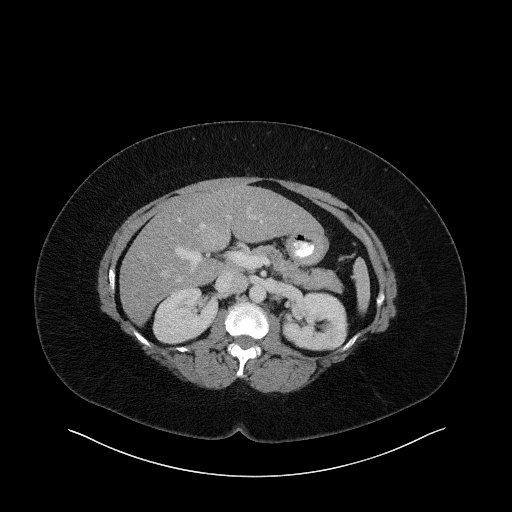
[im 62/124  lung]
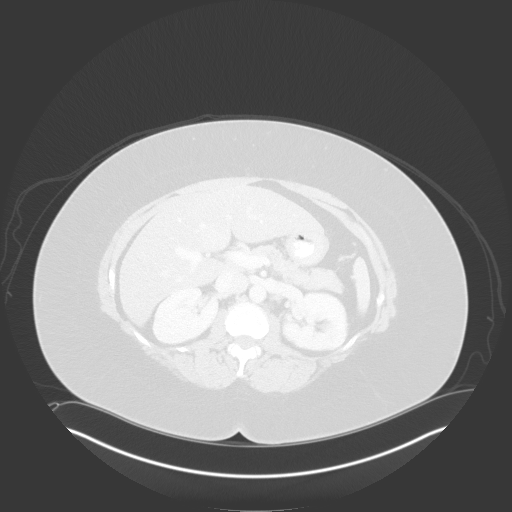
[im 74/124  lung]
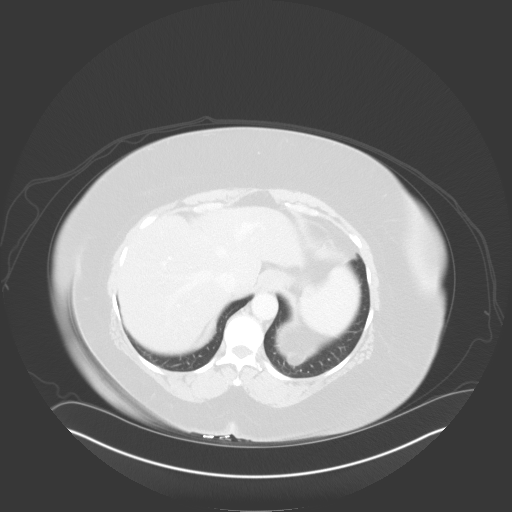
[im 87/124  lung]
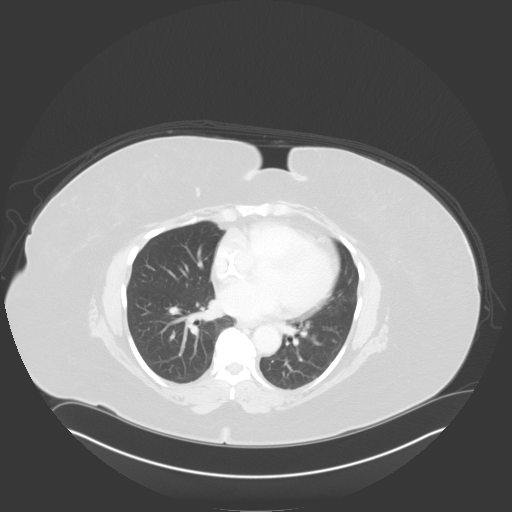
[im 99/124  lung]
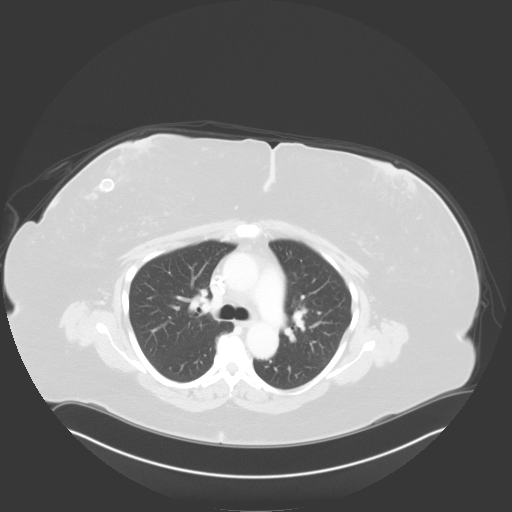
[im 111/124  mediastinal]
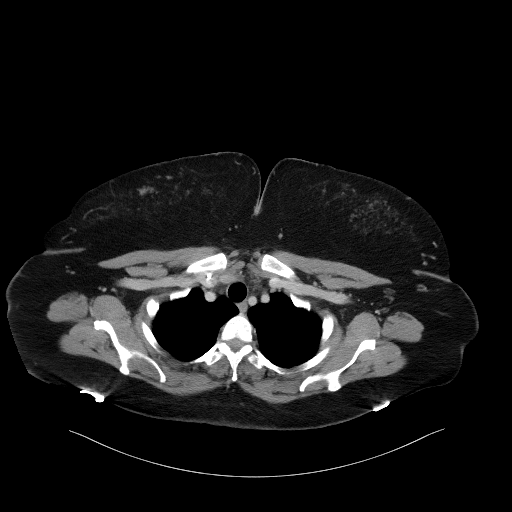
[im 111/124  lung]
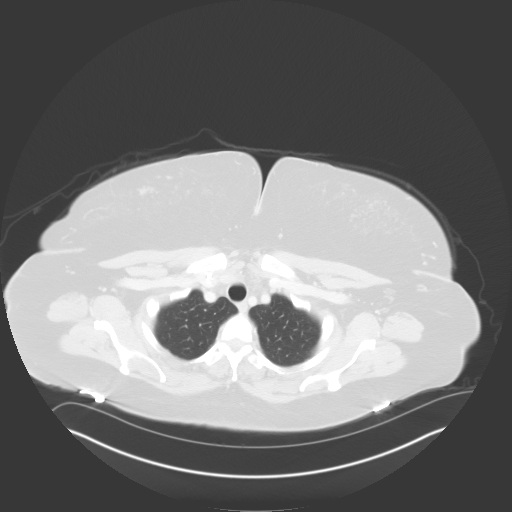

[Series 4: coronals · coronal · 0.89mm/px · 3 of 164 slices shown]
[im 33/164  lung]
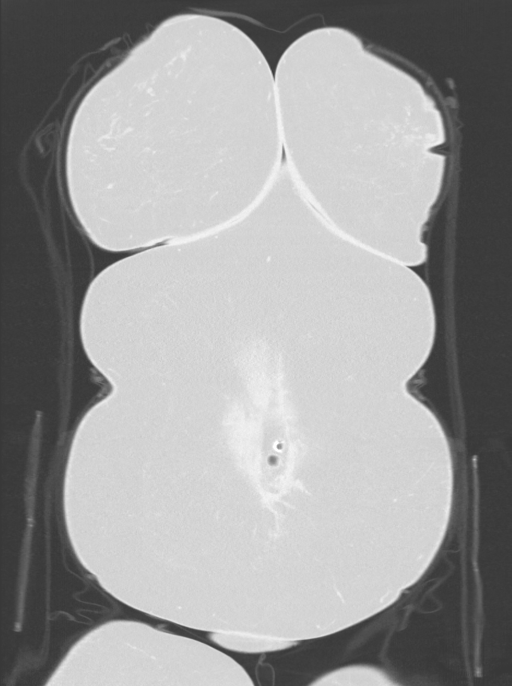
[im 66/164  lung]
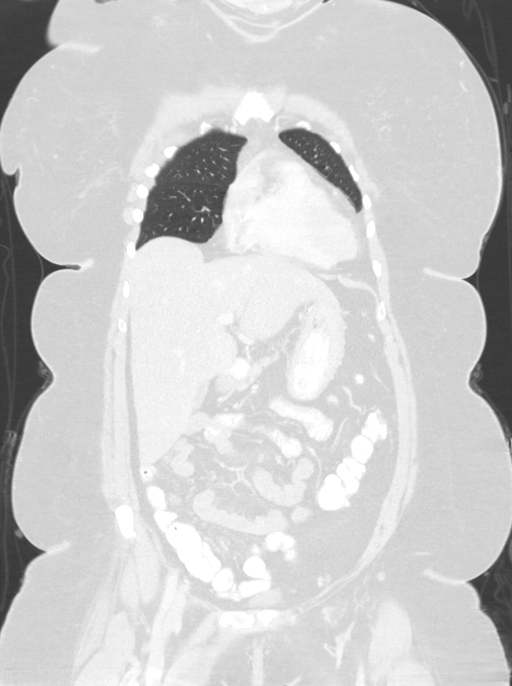
[im 98/164  lung]
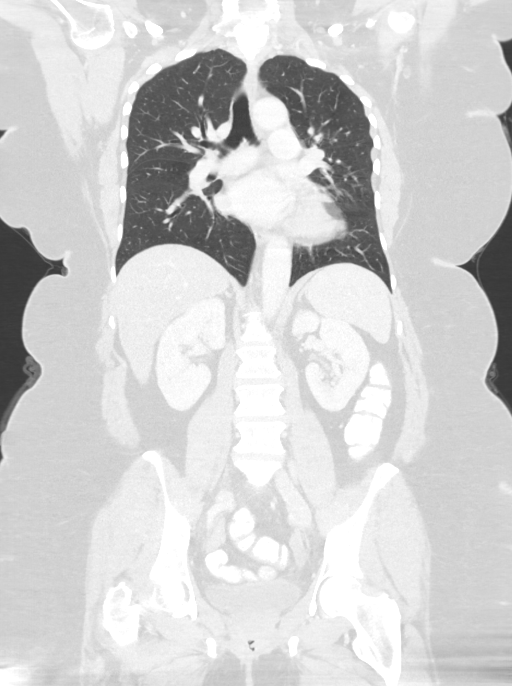

[Series 6: lung · axial · 0.98mm/px · z∈[-162,-96]mm · 3 of 145 slices shown]
[im 12/145  lung]
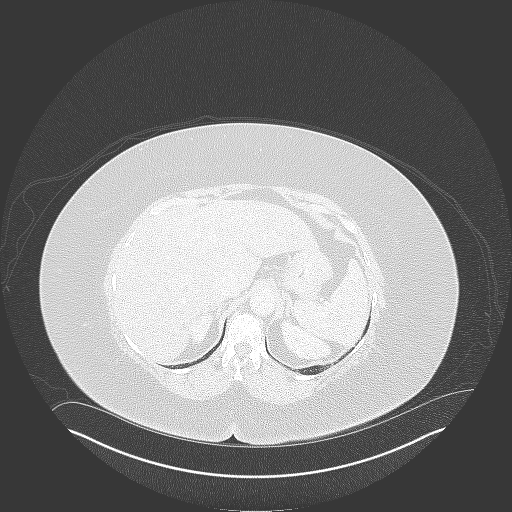
[im 34/145  lung]
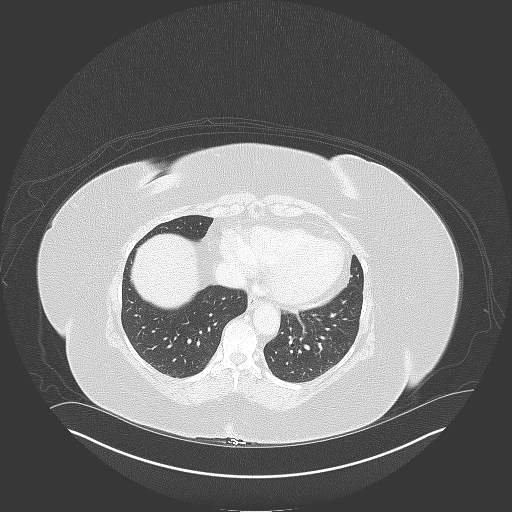
[im 45/145  lung]
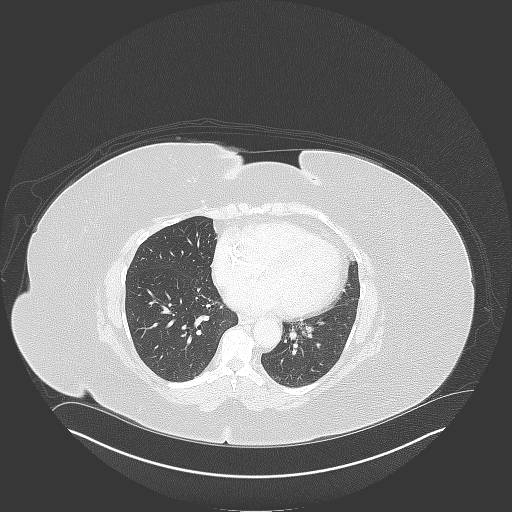

[15 of 36 positions shown; findings below may reference images not displayed]

FINDINGS: CT CHEST FINDINGS

Cardiovascular: The heart is normal in size. No pericardial
effusion.

No evidence of thoracic aortic aneurysm.

Right chest port terminates in the upper right atrium.

Mediastinum/Nodes: No suspicious mediastinal, hilar, or axillary
lymphadenopathy.

4 mm short axis left supraclavicular node (series 2/image 9),
unchanged, within normal limits.

Visualized thyroid is unremarkable.

Lungs/Pleura: No suspicious pulmonary nodules.

No focal consolidation.

No pleural effusion or pneumothorax.

Musculoskeletal: Degenerative changes of the thoracic spine.

CT ABDOMEN PELVIS FINDINGS

Hepatobiliary: Liver is within normal limits. No
suspicious/enhancing hepatic lesions.

Gallbladder is unremarkable. No intrahepatic or extrahepatic ductal
dilatation.

Pancreas: Within normal limits.

Spleen: Within normal limits.

Adrenals/Urinary Tract: Adrenal glands are within normal limits.

Kidneys are within normal limits.  No hydronephrosis.

Bladder is within normal limits.

Stomach/Bowel: Stomach is within normal limits.

Status post right hemicolectomy with appendectomy.

No evidence of bowel obstruction.

Vascular/Lymphatic: No evidence of abdominal aortic aneurysm.

No suspicious abdominopelvic lymphadenopathy.

Reproductive: Status post hysterectomy.

Bilateral ovaries are within normal limits.

Other: No abdominopelvic ascites.

Musculoskeletal: Visualized osseous structures are within normal
limits.
IMPRESSION: Status post right hemicolectomy.

No evidence of recurrent or metastatic disease.

## 2019-11-30 ENCOUNTER — Encounter: Payer: Self-pay | Admitting: Certified Nurse Midwife

## 2019-12-03 ENCOUNTER — Ambulatory Visit: Payer: BC Managed Care – PPO | Attending: Internal Medicine

## 2019-12-03 DIAGNOSIS — Z23 Encounter for immunization: Secondary | ICD-10-CM

## 2019-12-03 NOTE — Progress Notes (Signed)
   Covid-19 Vaccination Clinic  Name:  Sharon Allison    MRN: [id number] DOB: [birth year]  12/03/2019  Sharon Allison was observed post Covid-19 immunization for 30 minutes based on pre-vaccination screening without incident. She was provided with Vaccine Information Sheet and instruction to access the V-Safe system.   Sharon Allison was instructed to call 911 with any severe reactions post vaccine: Marland Kitchen Difficulty breathing  . Swelling of face and throat  . A fast heartbeat  . A bad rash all over body  . Dizziness and weakness   Immunizations Administered    Name Date Dose VIS Date Route   Pfizer COVID-19 Vaccine 12/03/2019 11:24 AM 0.3 mL 08/20/2019 Intramuscular   Manufacturer: [place]   Lot: [id number]   [place]: [id number]

## 2019-12-07 ENCOUNTER — Inpatient Hospital Stay: Payer: BC Managed Care – PPO | Attending: Oncology

## 2019-12-07 ENCOUNTER — Other Ambulatory Visit: Payer: Self-pay

## 2019-12-07 DIAGNOSIS — C182 Malignant neoplasm of ascending colon: Secondary | ICD-10-CM | POA: Diagnosis present

## 2019-12-07 DIAGNOSIS — J449 Chronic obstructive pulmonary disease, unspecified: Secondary | ICD-10-CM | POA: Diagnosis not present

## 2019-12-07 DIAGNOSIS — Z95828 Presence of other vascular implants and grafts: Secondary | ICD-10-CM

## 2019-12-07 MED ORDER — SODIUM CHLORIDE 0.9% FLUSH
10.0000 mL | Freq: Once | INTRAVENOUS | Status: AC
  Administered 2019-12-07: 10 mL
  Filled 2019-12-07: qty 10

## 2019-12-07 MED ORDER — HEPARIN SOD (PORK) LOCK FLUSH 100 UNIT/ML IV SOLN
500.0000 [IU] | Freq: Once | INTRAVENOUS | Status: AC
  Administered 2019-12-07: 500 [IU]
  Filled 2019-12-07: qty 5

## 2019-12-27 ENCOUNTER — Ambulatory Visit: Payer: BC Managed Care – PPO | Attending: Internal Medicine

## 2019-12-27 DIAGNOSIS — Z23 Encounter for immunization: Secondary | ICD-10-CM

## 2019-12-27 NOTE — Progress Notes (Signed)
   Covid-19 Vaccination Clinic  Name:  Sharon Allison    MRN: [id number] DOB: 04/06/1956  12/27/2019  [name] was observed post Covid-19 immunization for 30 minutes based on pre-vaccination screening without incident. She was provided with Vaccine Information Sheet and instruction to access the V-Safe system.   [name] was instructed to call 911 with any severe reactions post vaccine: Marland Kitchen Difficulty breathing  . Swelling of face and throat  . A fast heartbeat  . A bad rash all over body  . Dizziness and weakness   Immunizations Administered    Name Date Dose VIS Date Route   Pfizer COVID-19 Vaccine 12/27/2019  3:50 PM 0.3 mL 11/03/2018 Intramuscular   Manufacturer: [place]   Lot: [id number]   [place]: [id number]

## 2020-02-01 ENCOUNTER — Encounter: Payer: Self-pay | Admitting: Nurse Practitioner

## 2020-02-01 ENCOUNTER — Telehealth: Payer: Self-pay | Admitting: Oncology

## 2020-02-01 ENCOUNTER — Other Ambulatory Visit: Payer: Self-pay

## 2020-02-01 ENCOUNTER — Inpatient Hospital Stay: Payer: BC Managed Care – PPO

## 2020-02-01 ENCOUNTER — Inpatient Hospital Stay: Payer: BC Managed Care – PPO | Attending: Oncology | Admitting: Nurse Practitioner

## 2020-02-01 VITALS — BP 137/81 | HR 70 | Temp 97.5°F | Resp 18 | Ht 62.5 in | Wt 251.9 lb

## 2020-02-01 DIAGNOSIS — J45909 Unspecified asthma, uncomplicated: Secondary | ICD-10-CM | POA: Diagnosis not present

## 2020-02-01 DIAGNOSIS — K760 Fatty (change of) liver, not elsewhere classified: Secondary | ICD-10-CM | POA: Diagnosis not present

## 2020-02-01 DIAGNOSIS — C182 Malignant neoplasm of ascending colon: Secondary | ICD-10-CM

## 2020-02-01 DIAGNOSIS — Z85038 Personal history of other malignant neoplasm of large intestine: Secondary | ICD-10-CM | POA: Diagnosis present

## 2020-02-01 DIAGNOSIS — Z79899 Other long term (current) drug therapy: Secondary | ICD-10-CM | POA: Insufficient documentation

## 2020-02-01 DIAGNOSIS — R197 Diarrhea, unspecified: Secondary | ICD-10-CM | POA: Diagnosis not present

## 2020-02-01 DIAGNOSIS — D123 Benign neoplasm of transverse colon: Secondary | ICD-10-CM | POA: Diagnosis not present

## 2020-02-01 DIAGNOSIS — T451X5A Adverse effect of antineoplastic and immunosuppressive drugs, initial encounter: Secondary | ICD-10-CM | POA: Diagnosis not present

## 2020-02-01 DIAGNOSIS — D6959 Other secondary thrombocytopenia: Secondary | ICD-10-CM | POA: Insufficient documentation

## 2020-02-01 DIAGNOSIS — Z95828 Presence of other vascular implants and grafts: Secondary | ICD-10-CM

## 2020-02-01 MED ORDER — SODIUM CHLORIDE 0.9% FLUSH
10.0000 mL | Freq: Once | INTRAVENOUS | Status: AC
  Administered 2020-02-01: 10 mL
  Filled 2020-02-01: qty 10

## 2020-02-01 MED ORDER — HEPARIN SOD (PORK) LOCK FLUSH 100 UNIT/ML IV SOLN
500.0000 [IU] | Freq: Once | INTRAVENOUS | Status: AC
  Administered 2020-02-01: 500 [IU]
  Filled 2020-02-01: qty 5

## 2020-02-01 MED ORDER — PREDNISONE 50 MG PO TABS: ORAL_TABLET | ORAL | 0 refills | Status: DC

## 2020-02-01 NOTE — Telephone Encounter (Signed)
Scheduled per 5/25 los. Printed avs and calendar for pt.  

## 2020-02-01 NOTE — Progress Notes (Signed)
  [facility] OFFICE PROGRESS NOTE   Diagnosis: Colon cancer  INTERVAL HISTORY:   Ms. Sivertson returns as scheduled.  She denies any change in bowel habits.  No bleeding with bowel movements.  No abdominal pain.  She has a good appetite.  Objective:  Vital signs in last 24 hours:  Blood pressure 137/81, pulse 70, temperature (!) 97.5 F (36.4 C), temperature source Temporal, resp. rate 18, height 5' 2.5" (1.588 m), weight 251 lb 14.4 oz (114.3 kg), last menstrual period 07/10/2001, SpO2 97 %.    HEENT: Neck without mass. Lymphatics: No palpable cervical, supraclavicular, axillary or inguinal lymph nodes. Resp: Lungs clear bilaterally. Cardio: Regular rate and rhythm. GI: Abdomen soft and nontender.  No hepatomegaly. Vascular: No leg edema. Port-A-Cath without erythema.  Lab Results:  Lab Results  Component Value Date   WBC 9.9 08/13/2019   HGB 13.9 08/13/2019   HCT 41.5 08/13/2019   MCV 90.4 08/13/2019   PLT 291 08/13/2019   NEUTROABS 8.4 (H) 08/13/2019    Imaging:  No results found.  Medications: I have reviewed the patient's current medications.  Assessment/Plan: 1. 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 MSI-stable ? 3/14 lymph nodes positive for metastatic carcinoma, lymphovascular invasion present ? Cycle 1 FOLFOX 10/22/2017 ? CTs chest, abdomen, and pelvis 10/28/2017-negative for metastatic disease ? Cycle 2 FOLFOX 11/05/2017 ? Cycle 3 FOLFOX 11/27/2017 ? Cycle 4 FOLFOX 12/10/2017 ? Cycle 5 FOLFOX 12/24/2017 ? Cycle 6 FOLFOX 01/08/2018 (Oxaliplatin held due to thrombocytopenia) ? Cycle 7 FOLFOX 01/21/2018-oxaliplatin resumed ? Cycle 8 FOLFOX 02/04/2018-Oxaliplatin eliminated due to an allergic reaction with cycle 7 ? Cycle 9 FOLFOX6/26/2019-no Oxaliplatin; 5-FU bolus eliminated, infusional 5-FU dose reduced ? Cycle 10 FOLFOX 03/17/2018 ? Cycle 11 FOLFOX 03/31/2018 ? Cycle 12 FOLFOX  04/14/2018 ? Surveillance colonoscopy 06/30/2018-tubular adenoma removed from the transverse colon ? CTs 08/25/2018-negative ? CTs 08/13/2019-no evidence of recurrent colon cancer, focal fatty change in the left liver  2.Asthma  3.Port-A-Cath placement 10/17/2017  4.History of neutropenia secondary to chemotherapy-resolved  5.History of thrombocytopenia secondary to chemotherapy  6.Allergic reaction to Oxaliplatin 01/21/2018-pruritus, tachycardia, diaphoresis, hypotension  7.Diarrhea following cycle 8 FOLFOX-resolved, 5-FU bolus eliminated and infusion dose decreased beginning with cycle 9   Disposition: Ms. Demuro appears well.  She remains in clinical remission from colon cancer.  We will follow-up on the CEA from today.  She will undergo CT scans in 6 months.  She does not have a primary care provider.  We made a referral to Dr. Alain Marion.  She will return for a Port-A-Cath flush in 8 weeks and 16 weeks.  She will return for a follow-up visit in 6 months, CTs a few days prior.    Ned Card ANP/GNP-BC   02/01/2020  2:55 PM

## 2020-02-01 NOTE — Patient Instructions (Signed)

## 2020-02-02 LAB — CEA (IN HOUSE-CHCC): CEA (CHCC-In House): 1.63 ng/mL (ref 0.00–5.00)

## 2020-02-03 ENCOUNTER — Telehealth: Payer: Self-pay

## 2020-02-03 NOTE — Telephone Encounter (Signed)
TC to pt per Ned Card NP to let her know that her CEA is stable in normal range. Follow-up as scheduled. Patient verbalized understanding.

## 2020-03-28 ENCOUNTER — Inpatient Hospital Stay: Payer: BC Managed Care – PPO | Attending: Oncology

## 2020-03-28 ENCOUNTER — Other Ambulatory Visit: Payer: Self-pay

## 2020-03-28 DIAGNOSIS — Z85038 Personal history of other malignant neoplasm of large intestine: Secondary | ICD-10-CM | POA: Diagnosis present

## 2020-03-28 DIAGNOSIS — Z95828 Presence of other vascular implants and grafts: Secondary | ICD-10-CM

## 2020-03-28 DIAGNOSIS — Z452 Encounter for adjustment and management of vascular access device: Secondary | ICD-10-CM | POA: Diagnosis not present

## 2020-03-28 DIAGNOSIS — C182 Malignant neoplasm of ascending colon: Secondary | ICD-10-CM

## 2020-03-28 MED ORDER — SODIUM CHLORIDE 0.9% FLUSH
10.0000 mL | Freq: Once | INTRAVENOUS | Status: AC
  Administered 2020-03-28: 10 mL
  Filled 2020-03-28: qty 10

## 2020-03-28 MED ORDER — HEPARIN SOD (PORK) LOCK FLUSH 100 UNIT/ML IV SOLN
500.0000 [IU] | Freq: Once | INTRAVENOUS | Status: AC
  Administered 2020-03-28: 500 [IU]
  Filled 2020-03-28: qty 5

## 2020-03-28 NOTE — Patient Instructions (Signed)

## 2020-05-23 ENCOUNTER — Other Ambulatory Visit: Payer: Self-pay

## 2020-05-23 ENCOUNTER — Inpatient Hospital Stay: Payer: BC Managed Care – PPO | Attending: Oncology

## 2020-05-23 DIAGNOSIS — Z79899 Other long term (current) drug therapy: Secondary | ICD-10-CM | POA: Insufficient documentation

## 2020-05-23 DIAGNOSIS — C182 Malignant neoplasm of ascending colon: Secondary | ICD-10-CM | POA: Diagnosis present

## 2020-05-23 DIAGNOSIS — Z862 Personal history of diseases of the blood and blood-forming organs and certain disorders involving the immune mechanism: Secondary | ICD-10-CM | POA: Diagnosis not present

## 2020-05-23 DIAGNOSIS — J45909 Unspecified asthma, uncomplicated: Secondary | ICD-10-CM | POA: Insufficient documentation

## 2020-05-23 DIAGNOSIS — Z95828 Presence of other vascular implants and grafts: Secondary | ICD-10-CM

## 2020-05-23 MED ORDER — SODIUM CHLORIDE 0.9% FLUSH
10.0000 mL | Freq: Once | INTRAVENOUS | Status: AC
  Administered 2020-05-23: 10 mL
  Filled 2020-05-23: qty 10

## 2020-05-23 MED ORDER — HEPARIN SOD (PORK) LOCK FLUSH 100 UNIT/ML IV SOLN
500.0000 [IU] | Freq: Once | INTRAVENOUS | Status: AC
  Administered 2020-05-23: 500 [IU]
  Filled 2020-05-23: qty 5

## 2020-05-23 NOTE — Patient Instructions (Signed)

## 2020-06-14 ENCOUNTER — Ambulatory Visit: Payer: BC Managed Care – PPO | Admitting: Certified Nurse Midwife

## 2020-06-28 ENCOUNTER — Encounter: Payer: Self-pay | Admitting: Allergy

## 2020-06-28 ENCOUNTER — Other Ambulatory Visit: Payer: Self-pay

## 2020-06-28 ENCOUNTER — Ambulatory Visit: Payer: BC Managed Care – PPO | Admitting: Allergy

## 2020-06-28 VITALS — BP 118/82 | HR 70 | Temp 97.5°F | Resp 16 | Ht 62.5 in | Wt 247.0 lb

## 2020-06-28 DIAGNOSIS — J4540 Moderate persistent asthma, uncomplicated: Secondary | ICD-10-CM

## 2020-06-28 DIAGNOSIS — J310 Chronic rhinitis: Secondary | ICD-10-CM | POA: Diagnosis not present

## 2020-06-28 MED ORDER — LEVOCETIRIZINE DIHYDROCHLORIDE 5 MG PO TABS
5.0000 mg | ORAL_TABLET | Freq: Every evening | ORAL | 5 refills | Status: DC
Start: 2020-06-28 — End: 2021-02-14

## 2020-06-28 MED ORDER — MONTELUKAST SODIUM 10 MG PO TABS
10.0000 mg | ORAL_TABLET | Freq: Every day | ORAL | 5 refills | Status: DC
Start: 2020-06-28 — End: 2021-02-14

## 2020-06-28 MED ORDER — BUDESONIDE-FORMOTEROL FUMARATE 160-4.5 MCG/ACT IN AERO
2.0000 | INHALATION_SPRAY | Freq: Two times a day (BID) | RESPIRATORY_TRACT | 5 refills | Status: DC
Start: 2020-06-28 — End: 2021-08-06

## 2020-06-28 NOTE — Assessment & Plan Note (Signed)
Stable with below regimen.  No recent nasal spray or eyedrop use.  May use over the counter antihistamines such as Zyrtec (cetirizine), Claritin (loratadine), Allegra (fexofenadine), or Xyzal (levocetirizine) daily as needed. May take twice a day if needed.   May use Flonase 2 sprays per nostril daily for nasal congestion.  May use azelastine nasal spray 1-2 spays per nostril 1-2 times a day as needed for drainage.  May use Zaditor eye drops as needed 1-2 times a day for itchy/watery eyes.

## 2020-06-28 NOTE — Assessment & Plan Note (Addendum)
Stable with below regimen.  Flares with exertion and with URIs.  Only using Symbicort once a day now. . 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  . ACT score of 24. . Daily controller medication(s): continue Symbicort 148mcg 2 puffs once a day. . Prior to physical activity: May use albuterol rescue inhaler 2 puffs 5 to 15 minutes prior to strenuous physical activities. Marland Kitchen Rescue medications: May use albuterol rescue inhaler 2 puffs or nebulizer every 4 to 6 hours as needed for shortness of breath, chest tightness, coughing, and wheezing. Monitor frequency of use.  . During upper respiratory infections/asthma flares: Start Singulair 10mg  daily.  o Increase Symbicort 135mcg to 2 puffs twice a day for 1-2 weeks.

## 2020-06-28 NOTE — Patient Instructions (Addendum)
You can schedule to get your [place] booster in our clinic this Friday if interested.  Asthma: . Daily controller medication(s): continue Symbicort 176mcg 2 puffs once a day. . Prior to physical activity: May use albuterol rescue inhaler 2 puffs 5 to 15 minutes prior to strenuous physical activities. Marland Kitchen Rescue medications: May use albuterol rescue inhaler 2 puffs or nebulizer every 4 to 6 hours as needed for shortness of breath, chest tightness, coughing, and wheezing. Monitor frequency of use.  . During upper respiratory infections/asthma flares: Start Singulair 10mg  daily.  o Increase Symbicort 141mcg to 2 puffs twice a day for 1-2 weeks. . Asthma control goals:  o Full participation in all desired activities (may need albuterol before activity) o Albuterol use two times or less a week on average (not counting use with activity) o Cough interfering with sleep two times or less a month o Oral steroids no more than once a year o No hospitalizations  Environmental allergies:  May use over the counter antihistamines such as Zyrtec (cetirizine), Claritin (loratadine), Allegra (fexofenadine), or Xyzal (levocetirizine) daily as needed. May take twice a day if needed.   May use Flonase 2 sprays per nostril daily for nasal congestion.  May use azelastine nasal spray 1-2 spays per nostril 1-2 times a day as needed for drainage.  May use Zaditor eye drops as needed 1-2 times a day for itchy/watery eyes.   Follow up in 6 months or sooner if needed.

## 2020-06-28 NOTE — Progress Notes (Signed)
Follow Up Note  RE: Sharon Allison MRN: [id number] DOB: 1956-06-01 Date of Office Visit: 06/28/2020  Referring provider: Regina Eck, CNM Primary care provider: Regina Eck, CNM  Chief Complaint: Asthma and Allergic Rhinitis   History of Present Illness: I had the pleasure of seeing Sharon Allison for a follow up visit at the [facility] on 06/28/2020. She is a 64 y.o. female, who is being followed for asthma and chronic rhinitis. Her previous allergy office visit was on 07/14/2019 with Dr. Maudie Mercury. Today is a regular follow up visit.  Moderate persistent asthma Gets shortness of breath mainly with exertion. No albuterol use since last visit. Denies any coughing, wheezing, chest tightness, nocturnal awakenings, ER/urgent care visits or prednisone use since the last visit. Only using Symbicort 178mcg 2 puffs once a day instead of twice a day.  Patient works part time now. Takes Singulair as needed when rhinitis symptoms flare up.    Chronic rhinitis Takes Claritin prn, Xyzal prn and Singulair as needed.  Not needed to use nasal sprays or eye drops.   Assessment and Plan: Sharon Allison is a 64 y.o. female with: Moderate persistent asthma without complication Stable with below regimen.  Flares with exertion and with URIs.  Only using Symbicort once a day now. . 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  . ACT score of 24. . Daily controller medication(s): continue Symbicort 133mcg 2 puffs once a day. . Prior to physical activity: May use albuterol rescue inhaler 2 puffs 5 to 15 minutes prior to strenuous physical activities. Marland Kitchen Rescue medications: May use albuterol rescue inhaler 2 puffs or nebulizer every 4 to 6 hours as needed for shortness of breath, chest tightness, coughing, and wheezing. Monitor frequency of use.  . During upper respiratory infections/asthma flares: Start Singulair 10mg  daily.  o Increase Symbicort 128mcg to 2 puffs twice a day for 1-2  weeks.  Chronic rhinitis Stable with below regimen.  No recent nasal spray or eyedrop use.  May use over the counter antihistamines such as Zyrtec (cetirizine), Claritin (loratadine), Allegra (fexofenadine), or Xyzal (levocetirizine) daily as needed. May take twice a day if needed.   May use Flonase 2 sprays per nostril daily for nasal congestion.  May use azelastine nasal spray 1-2 spays per nostril 1-2 times a day as needed for drainage.  May use Zaditor eye drops as needed 1-2 times a day for itchy/watery eyes.   Return in about 6 months (around 12/27/2020).  Meds ordered this encounter  Medications  . montelukast (SINGULAIR) 10 MG tablet    Sig: Take 1 tablet (10 mg total) by mouth at bedtime.    Dispense:  30 tablet    Refill:  5  . levocetirizine (XYZAL) 5 MG tablet    Sig: Take 1 tablet (5 mg total) by mouth every evening.    Dispense:  30 tablet    Refill:  5  . budesonide-formoterol (SYMBICORT) 160-4.5 MCG/ACT inhaler    Sig: Inhale 2 puffs into the lungs 2 (two) times daily.    Dispense:  10.2 g    Refill:  5   Diagnostics: Spirometry:  Tracings reviewed. Her effort: Good reproducible efforts. FVC: 2.10L FEV1: 1.75L, 97% predicted FEV1/FVC ratio: 83% Interpretation: Spirometry consistent with normal pattern.  Please see scanned spirometry results for details.  Medication List:  Current Outpatient Medications  Medication Sig Dispense Refill  . albuterol (VENTOLIN HFA) 108 (90 Base) MCG/ACT inhaler Inhale 2 puffs into the lungs every 4 (four)  hours as needed for wheezing or shortness of breath. 18 g 1  . budesonide-formoterol (SYMBICORT) 160-4.5 MCG/ACT inhaler Inhale 2 puffs into the lungs 2 (two) times daily. 10.2 g 5  . Cholecalciferol (VITAMIN D3) 1000 units CAPS Take 1,000 Units by mouth daily.     . diphenhydrAMINE (BENADRYL) 25 MG tablet Take 50 mg by mouth daily as needed for itching.    . levocetirizine (XYZAL) 5 MG tablet Take 1 tablet (5 mg total) by  mouth every evening. 30 tablet 5  . montelukast (SINGULAIR) 10 MG tablet Take 1 tablet (10 mg total) by mouth at bedtime. 30 tablet 5  . fluticasone (FLONASE) 50 MCG/ACT nasal spray Place 2 sprays into both nostrils daily. As needed for nasal congestion. (Patient not taking: Reported on 06/28/2020) 16 g 5   No current facility-administered medications for this visit.   Facility-Administered Medications Ordered in Other Visits  Medication Dose Route Frequency Provider Last Rate Last Admin  . famotidine (PEPCID) IVPB 20 mg premix  20 mg Intravenous Q12H Tanner, Lucianne Lei E., PA-C   Stopped at 01/21/18 1528  . heparin lock flush 100 unit/mL  500 Units Intracatheter Once PRN Betsy Coder B, MD      . sodium chloride flush (NS) 0.9 % injection 10 mL  10 mL Intracatheter PRN Ladell Pier, MD       Allergies: Allergies  Allergen Reactions  . Oxaliplatin Shortness Of Breath    Sweating   . Iohexol Swelling    Patient had swelling and itching to face in 1980's. Had 13 hour pre meds before scan 10/27/17  . Iodine Swelling  . Sulfa Antibiotics Itching   I reviewed her past medical history, social history, family history, and environmental history and no significant changes have been reported from her previous visit.  Review of Systems  Constitutional: Negative for appetite change, chills, fever and unexpected weight change.  HENT: Negative for congestion and rhinorrhea.   Eyes: Negative for itching.  Respiratory: Negative for cough, chest tightness, shortness of breath and wheezing.   Gastrointestinal: Negative for abdominal pain.  Skin: Negative for rash.  Neurological: Negative for headaches.   Objective: BP 118/82   Pulse 70   Temp (!) 97.5 F (36.4 C) (Temporal)   Resp 16   Ht 5' 2.5" (1.588 m)   Wt 247 lb (112 kg)   LMP 07/10/2001 (LMP Unknown)   SpO2 96%   BMI 44.46 kg/m  Body mass index is 44.46 kg/m. Physical Exam Vitals and nursing note reviewed.  Constitutional:       Appearance: Normal appearance. She is well-developed.  HENT:     Head: Normocephalic and atraumatic.     Right Ear: External ear normal.     Left Ear: External ear normal.     Nose: Nose normal.     Mouth/Throat:     Mouth: Mucous membranes are moist.     Pharynx: Oropharynx is clear.  Eyes:     Conjunctiva/sclera: Conjunctivae normal.  Cardiovascular:     Rate and Rhythm: Normal rate and regular rhythm.     Heart sounds: Normal heart sounds. No murmur heard.  No friction rub. No gallop.   Pulmonary:     Effort: Pulmonary effort is normal.     Breath sounds: Normal breath sounds. No wheezing or rales.  Musculoskeletal:     Cervical back: Neck supple.  Skin:    General: Skin is warm.     Findings: No rash.  Neurological:  Mental Status: She is alert and oriented to person, place, and time.  Psychiatric:        Behavior: Behavior normal.    Previous notes and tests were reviewed. The plan was reviewed with the patient/family, and all questions/concerned were addressed.  It was my pleasure to see Alizon today and participate in her care. Please feel free to contact me with any questions or concerns.  Sincerely,  Rexene Alberts, DO Allergy & Immunology  [facility] office: [place] office: [phone]

## 2020-06-30 ENCOUNTER — Other Ambulatory Visit: Payer: Self-pay

## 2020-06-30 ENCOUNTER — Ambulatory Visit (INDEPENDENT_AMBULATORY_CARE_PROVIDER_SITE_OTHER): Payer: BC Managed Care – PPO | Admitting: *Deleted

## 2020-06-30 DIAGNOSIS — Z23 Encounter for immunization: Secondary | ICD-10-CM

## 2020-06-30 NOTE — Progress Notes (Signed)
   Covid-19 Vaccination Clinic  Name:  LIS SAVITT    MRN: [id number] DOB: [birth year]  06/30/2020  Ms. Ritchey was observed post Covid-19 immunization for 30 minutes based on pre-vaccination screening without incident. She was provided with Vaccine Information Sheet and instruction to access the V-Safe system.   Ms. Kropp was instructed to call 911 with any severe reactions post vaccine: Marland Kitchen Difficulty breathing  . Swelling of face and throat  . A fast heartbeat  . A bad rash all over body  . Dizziness and weakness

## 2020-07-11 NOTE — Progress Notes (Signed)
64 y.o. G0P0000 Divorced Black or [place] American female here for annual exam.   Diagnosed with Colon Cancer - stage 3 Will be three years out this month, will go for last CT the day before Thanksgiving . States in remission.  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  Hx: Hysterectomy for Fibroid. Still has ovaries.  Denies specific problems today    Patient's last menstrual period was 07/10/2001 (lmp unknown).          Sexually active: No.  The current method of family planning is status post hysterectomy.    Exercising: No.  exercise Smoker:  no  Health Maintenance: Pap:  04-30-18 neg HPV HR neg History of abnormal Pap:  no MMG:  07-19-2019 category b density birads 2:neg Colonoscopy:  2019 ( hx of colon cancer).  BMD:   09-21-2019 normal f/u 58yrs TDaP:  2014 Gardasil:   n/a Covid-19: done Pneumonia vaccine(s):  Not done Shingrix:   Not done Hep C testing: hep c neg 2017 Screening Labs: Cancer center has been monitoring her routine lab work. Labs to be done there this month (still has port-a-cath)   reports that she has never smoked. She has never used smokeless tobacco. She reports previous alcohol use. She reports that she does not use drugs.  Past Medical History:  Diagnosis Date  . Anemia   . Asthma   . colon ca dx'd 09/2017   colon cancer  . Colon polyp   . Diabetes mellitus without complication ([place])   . Fibroid   . GERD (gastroesophageal reflux disease)   . Heart murmur    per patient " dr white told me i had a murmur but i told him they told me that when i was a kid and it had never bothered me "   . Hypertension   . Sleep apnea     Past Surgical History:  Procedure Laterality Date  . ABDOMINAL HYSTERECTOMY     2002  . COLONOSCOPY N/A 09/18/2017   Procedure: COLONOSCOPY;  Surgeon: Ileana Roup, MD;  Location: [facility];  Service: General;  Laterality: N/A;  . COLONOSCOPY WITH PROPOFOL N/A  06/30/2018   Procedure: COLONOSCOPY WITH PROPOFOL;  Surgeon: Juanita Craver, MD;  Location: [facility];  Service: Endoscopy;  Laterality: N/A;  . LAPAROSCOPIC RIGHT HEMI COLECTOMY Right 09/18/2017   Procedure: LAPAROSCOPIC RIGHT HEMI COLECTOMY ERAS PATHWAY;  Surgeon: Ileana Roup, MD;  Location: [facility];  Service: General;  Laterality: Right;  . MYOMECTOMY  DONE 5 YEARS PRIOR TO HYSTERECTOMY   multiple fibroids  . POLYPECTOMY  06/30/2018   Procedure: POLYPECTOMY;  Surgeon: Juanita Craver, MD;  Location: [facility];  Service: Endoscopy;;  . PORTACATH PLACEMENT N/A 10/17/2017   Procedure: INSERTION PORT-A-CATH;  Surgeon: Ileana Roup, MD;  Location: [facility];  Service: General;  Laterality: N/A;    Current Outpatient Medications  Medication Sig Dispense Refill  . albuterol (VENTOLIN HFA) 108 (90 Base) MCG/ACT inhaler Inhale 2 puffs into the lungs every 4 (four) hours as needed for wheezing or shortness of breath. 18 g 1  . budesonide-formoterol (SYMBICORT) 160-4.5 MCG/ACT inhaler Inhale 2 puffs into the lungs 2 (two) times daily. 10.2 g 5  . Cholecalciferol (VITAMIN D3) 1000 units CAPS Take 1,000 Units by mouth daily.     . diphenhydrAMINE (BENADRYL) 25 MG tablet Take 50 mg by mouth daily as needed for itching.    . fluticasone (FLONASE) 50 MCG/ACT nasal  spray Place 2 sprays into both nostrils daily. As needed for nasal congestion. 16 g 5  . levocetirizine (XYZAL) 5 MG tablet Take 1 tablet (5 mg total) by mouth every evening. 30 tablet 5  . montelukast (SINGULAIR) 10 MG tablet Take 1 tablet (10 mg total) by mouth at bedtime. 30 tablet 5   No current facility-administered medications for this visit.   Facility-Administered Medications Ordered in Other Visits  Medication Dose Route Frequency Provider Last Rate Last Admin  . famotidine (PEPCID) IVPB 20 mg premix  20 mg Intravenous Q12H Tanner, Lucianne Lei E., PA-C   Stopped at 01/21/18 1528  . heparin lock flush 100 unit/mL  500 Units  Intracatheter Once PRN Betsy Coder B, MD      . sodium chloride flush (NS) 0.9 % injection 10 mL  10 mL Intracatheter PRN Ladell Pier, MD        Family History  Problem Relation Age of Onset  . Hypertension Mother   . Leukemia Mother   . Lupus Father   . Hodgkin's lymphoma Maternal Grandmother   . Cancer Maternal Grandfather        prostate  . Leukemia Sister   . Kidney disease Brother   . Leukemia Brother   . Allergic rhinitis Neg Hx   . Asthma Neg Hx   . Eczema Neg Hx   . Urticaria Neg Hx     Review of Systems  Constitutional: Negative.   HENT: Negative.   Eyes: Negative.   Respiratory: Negative.   Cardiovascular: Negative.   Gastrointestinal: Negative.   Endocrine: Negative.   Genitourinary: Negative.   Musculoskeletal: Negative.   Skin: Negative.   Allergic/Immunologic: Negative.   Neurological: Negative.   Hematological: Negative.   Psychiatric/Behavioral: Negative.     Exam:   BP 124/76   Pulse 72   Resp 16   Ht 5' 2.25" (1.581 m)   Wt 244 lb (110.7 kg)   LMP 07/10/2001 (LMP Unknown)   BMI 44.27 kg/m   Height: 5' 2.25" (158.1 cm)  General appearance: alert, cooperative and appears stated age Head: Normocephalic, without obvious abnormality, atraumatic Neck: no adenopathy, supple, symmetrical, trachea midline and thyroid normal to inspection and palpation Lungs: clear to auscultation bilaterally Breasts: normal appearance, no masses or tenderness     Heart: regular rate and rhythm Abdomen: soft, non-tender; bowel sounds normal; no masses,  no organomegaly Extremities: extremities normal, atraumatic, no cyanosis or edema Skin: Skin color, texture, turgor normal. No rashes or lesions Lymph nodes: Cervical, supraclavicular, and axillary nodes normal. No abnormal inguinal nodes palpated Neurologic: Grossly normal   Pelvic: External genitalia:  no lesions              Urethra:  normal appearing urethra with no masses, tenderness or lesions               Bartholins and Skenes: normal                 Vagina: normal appearing (for age) vagina with normal color and discharge, no lesions              Cervix: absent              Pap taken: No. Bimanual Exam:  Uterus:  uterus absent              Adnexa: no mass, fullness, tenderness               Rectovaginal: Confirms  Anus:  normal sphincter tone, no lesions  Chaperone was present for exam.  A:   Well Woman with normal exam  P:   Encouraged to establish with  PCP Since having blood work drawn at cancer center through port-a-cath and has difficult veins, ask about A1C with blood work (some elevated blood sugars in records) Discontinue pap smears Pt to call for mammogram, (called this month but was told had to wait to schedule since she recently got her Covid booster)  return annually or prn

## 2020-07-13 ENCOUNTER — Encounter: Payer: Self-pay | Admitting: Nurse Practitioner

## 2020-07-13 ENCOUNTER — Ambulatory Visit: Payer: BC Managed Care – PPO | Admitting: Nurse Practitioner

## 2020-07-13 ENCOUNTER — Other Ambulatory Visit: Payer: Self-pay

## 2020-07-13 VITALS — BP 124/76 | HR 72 | Resp 16 | Ht 62.25 in | Wt 244.0 lb

## 2020-07-13 DIAGNOSIS — Z01419 Encounter for gynecological examination (general) (routine) without abnormal findings: Secondary | ICD-10-CM

## 2020-07-13 NOTE — Patient Instructions (Addendum)
Nice to meet you today! I encourage you to establish care with a Primary Care Provider. You may want to try: [facility] [place]: [phone]  Call and get your mammogram scheduled. They are scheduling into January right now, so you will want to get on the calendar.   Consider asking for Hemoglobin A1C with your next lab drawn to test for diabetes. (Your blood sugar has been elevated with some of your labs)  Increase exercise, walking is a good choice. [employer] has some nice (safe) trails.  Good luck with your CT scan later this month!

## 2020-07-14 ENCOUNTER — Other Ambulatory Visit: Payer: Self-pay | Admitting: Nurse Practitioner

## 2020-07-14 DIAGNOSIS — Z1231 Encounter for screening mammogram for malignant neoplasm of breast: Secondary | ICD-10-CM

## 2020-08-01 ENCOUNTER — Other Ambulatory Visit: Payer: Self-pay

## 2020-08-01 ENCOUNTER — Telehealth: Payer: Self-pay

## 2020-08-01 NOTE — Telephone Encounter (Signed)
Pt called about prednisone and scan no answer message left

## 2020-08-01 NOTE — Telephone Encounter (Signed)
-----   Message from Owens Shark, NP sent at 08/01/2020  8:54 AM EST ----- Please confirm with her that she has the prednisone to take prior to CT scans tomorrow.  It looks like I sent the prescription after her last visit 6 months ago.

## 2020-08-02 ENCOUNTER — Other Ambulatory Visit: Payer: Self-pay

## 2020-08-02 ENCOUNTER — Inpatient Hospital Stay: Payer: BC Managed Care – PPO

## 2020-08-02 ENCOUNTER — Inpatient Hospital Stay: Payer: BC Managed Care – PPO | Attending: Oncology

## 2020-08-02 ENCOUNTER — Ambulatory Visit (HOSPITAL_COMMUNITY)
Admission: RE | Admit: 2020-08-02 | Discharge: 2020-08-02 | Disposition: A | Discharge status: Home / Self Care | Payer: BC Managed Care – PPO | Source: Ambulatory Visit | Attending: Nurse Practitioner | Admitting: Nurse Practitioner

## 2020-08-02 DIAGNOSIS — Z79899 Other long term (current) drug therapy: Secondary | ICD-10-CM | POA: Insufficient documentation

## 2020-08-02 DIAGNOSIS — C180 Malignant neoplasm of cecum: Secondary | ICD-10-CM | POA: Diagnosis not present

## 2020-08-02 DIAGNOSIS — C182 Malignant neoplasm of ascending colon: Secondary | ICD-10-CM

## 2020-08-02 DIAGNOSIS — R202 Paresthesia of skin: Secondary | ICD-10-CM | POA: Diagnosis not present

## 2020-08-02 DIAGNOSIS — K760 Fatty (change of) liver, not elsewhere classified: Secondary | ICD-10-CM | POA: Insufficient documentation

## 2020-08-02 DIAGNOSIS — Z9049 Acquired absence of other specified parts of digestive tract: Secondary | ICD-10-CM | POA: Diagnosis not present

## 2020-08-02 DIAGNOSIS — Z95828 Presence of other vascular implants and grafts: Secondary | ICD-10-CM

## 2020-08-02 DIAGNOSIS — D123 Benign neoplasm of transverse colon: Secondary | ICD-10-CM | POA: Insufficient documentation

## 2020-08-02 DIAGNOSIS — D696 Thrombocytopenia, unspecified: Secondary | ICD-10-CM | POA: Diagnosis not present

## 2020-08-02 DIAGNOSIS — Z23 Encounter for immunization: Secondary | ICD-10-CM | POA: Diagnosis not present

## 2020-08-02 DIAGNOSIS — J45909 Unspecified asthma, uncomplicated: Secondary | ICD-10-CM | POA: Diagnosis not present

## 2020-08-02 DIAGNOSIS — T7840XA Allergy, unspecified, initial encounter: Secondary | ICD-10-CM | POA: Insufficient documentation

## 2020-08-02 LAB — BASIC METABOLIC PANEL - CANCER CENTER ONLY
Anion gap: 12 (ref 5–15)
BUN: 13 mg/dL (ref 8–23)
CO2: 21 mmol/L — ABNORMAL LOW (ref 22–32)
Calcium: 10.6 mg/dL — ABNORMAL HIGH (ref 8.9–10.3)
Chloride: 103 mmol/L (ref 98–111)
Creatinine: 0.82 mg/dL (ref 0.44–1.00)
GFR, Estimated: >60 mL/min (ref >60)
Glucose, Bld: 156 mg/dL — ABNORMAL HIGH (ref 70–99)
Potassium: 3.5 mmol/L (ref 3.5–5.1)
Sodium: 136 mmol/L (ref 135–145)

## 2020-08-02 LAB — CEA (IN HOUSE-CHCC): CEA (CHCC-In House): 1.26 ng/mL (ref 0.00–5.00)

## 2020-08-02 MED ORDER — SODIUM CHLORIDE 0.9% FLUSH
10.0000 mL | Freq: Once | INTRAVENOUS | Status: AC
  Administered 2020-08-02: 10 mL
  Filled 2020-08-02: qty 10

## 2020-08-02 MED ORDER — HEPARIN SOD (PORK) LOCK FLUSH 100 UNIT/ML IV SOLN: 500.0000 [IU] | Freq: Once | INTRAVENOUS | Status: DC

## 2020-08-02 MED ORDER — IOHEXOL 300 MG/ML  SOLN
100.0000 mL | Freq: Once | INTRAMUSCULAR | Status: AC | PRN
  Administered 2020-08-02: 100 mL via INTRAVENOUS

## 2020-08-02 MED ORDER — HEPARIN SOD (PORK) LOCK FLUSH 100 UNIT/ML IV SOLN
INTRAVENOUS | Status: AC
  Administered 2020-08-02: 500 [IU]
  Filled 2020-08-02: qty 5

## 2020-08-04 ENCOUNTER — Telehealth: Payer: Self-pay | Admitting: *Deleted

## 2020-08-04 NOTE — Telephone Encounter (Signed)
-----   Message from Sharon Pier, MD sent at 08/04/2020 11:35 AM EST ----- Please call patient, CTs are negative for cancer

## 2020-08-04 NOTE — Telephone Encounter (Addendum)
Left VM for patient to return call to office x 2.

## 2020-08-07 ENCOUNTER — Inpatient Hospital Stay: Payer: BC Managed Care – PPO | Admitting: Oncology

## 2020-08-07 ENCOUNTER — Other Ambulatory Visit: Payer: Self-pay

## 2020-08-07 ENCOUNTER — Telehealth: Payer: Self-pay | Admitting: Oncology

## 2020-08-07 VITALS — BP 151/73 | HR 87 | Temp 98.4°F | Resp 17 | Ht 62.25 in | Wt 245.9 lb

## 2020-08-07 DIAGNOSIS — C180 Malignant neoplasm of cecum: Secondary | ICD-10-CM | POA: Diagnosis not present

## 2020-08-07 DIAGNOSIS — Z23 Encounter for immunization: Secondary | ICD-10-CM

## 2020-08-07 DIAGNOSIS — C182 Malignant neoplasm of ascending colon: Secondary | ICD-10-CM

## 2020-08-07 MED ORDER — INFLUENZA VAC SPLIT QUAD 0.5 ML IM SUSY
PREFILLED_SYRINGE | INTRAMUSCULAR | Status: AC
  Filled 2020-08-07: qty 0.5

## 2020-08-07 MED ORDER — INFLUENZA VAC SPLIT QUAD 0.5 ML IM SUSY
0.5000 mL | PREFILLED_SYRINGE | Freq: Once | INTRAMUSCULAR | Status: AC
  Administered 2020-08-07: 0.5 mL via INTRAMUSCULAR

## 2020-08-07 NOTE — Telephone Encounter (Signed)
Scheduled appointment per 11/29 los. Spoke to patient who is aware of appointments dates and times. Gave patient calendar print out.

## 2020-08-07 NOTE — Progress Notes (Signed)
[place] OFFICE PROGRESS NOTE   Diagnosis: Colon cancer  INTERVAL HISTORY:   Ms. Norby returns as scheduled.  No difficulty with bowel function.  Good appetite.  She continues to have tingling in the feet, especially at night.  No neuropathy symptoms in the hands.  Objective:  Vital signs in last 24 hours:  Blood pressure (!) 151/73, pulse 87, temperature 98.4 F (36.9 C), temperature source Tympanic, resp. rate 17, height 5' 2.25" (1.581 m), weight 245 lb 14.4 oz (111.5 kg), last menstrual period 07/10/2001, SpO2 97 %.   Lymphatics: No cervical, supraclavicular, axillary, or inguinal nodes Resp: Lungs clear bilaterally Cardio: Regular rate and rhythm GI: No hepatosplenomegaly, no mass, nontender Vascular: No leg edema   Portacath/PICC-without erythema  Lab Results:  Lab Results  Component Value Date   WBC 9.9 08/13/2019   HGB 13.9 08/13/2019   HCT 41.5 08/13/2019   MCV 90.4 08/13/2019   PLT 291 08/13/2019   NEUTROABS 8.4 (H) 08/13/2019    CMP  Lab Results  Component Value Date   NA 136 08/02/2020   K 3.5 08/02/2020   CL 103 08/02/2020   CO2 21 (L) 08/02/2020   GLUCOSE 156 (H) 08/02/2020   BUN 13 08/02/2020   CREATININE 0.82 08/02/2020   CALCIUM 10.6 (H) 08/02/2020   PROT 7.6 08/13/2019   ALBUMIN 4.0 08/13/2019   AST 26 08/13/2019   ALT 32 08/13/2019   ALKPHOS 93 08/13/2019   BILITOT 0.3 08/13/2019   GFRNONAA >60 08/02/2020   GFRAA >60 08/13/2019    Lab Results  Component Value Date   CEA1 1.26 08/02/2020      Medications: I have reviewed the patient's current medications.   Assessment/Plan: 1. 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 MSI-stable ? 3/14 lymph nodes positive for metastatic carcinoma, lymphovascular invasion present ? Cycle 1 FOLFOX 10/22/2017 ? CTs chest, abdomen, and pelvis 10/28/2017-negative for metastatic disease ? Cycle 2 FOLFOX 11/05/2017 ? Cycle 3  FOLFOX 11/27/2017 ? Cycle 4 FOLFOX 12/10/2017 ? Cycle 5 FOLFOX 12/24/2017 ? Cycle 6 FOLFOX 01/08/2018 (Oxaliplatin held due to thrombocytopenia) ? Cycle 7 FOLFOX 01/21/2018-oxaliplatin resumed ? Cycle 8 FOLFOX 02/04/2018-Oxaliplatin eliminated due to an allergic reaction with cycle 7 ? Cycle 9 FOLFOX6/26/2019-no Oxaliplatin; 5-FU bolus eliminated, infusional 5-FU dose reduced ? Cycle 10 FOLFOX 03/17/2018 ? Cycle 11 FOLFOX 03/31/2018 ? Cycle 12 FOLFOX 04/14/2018 ? Surveillance colonoscopy 06/30/2018-tubular adenoma removed from the transverse colon ? CTs 08/25/2018-negative ? CTs 08/13/2019-no evidence of recurrent colon cancer, focal fatty change in the left liver ? 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 is in remission from colon cancer.  She is now almost 3 years out from diagnosis.  She will return for an office visit and CEA in 6 months.  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  She does not wish to begin medication for the neuropathy symptoms.  [name] will return for an office visit and CEA in 6 months.  She received an influenza vaccine today.  She would like to keep the Port-A-Cath in place for now.  She wants the ophthalmologist to use the Port-A-Cath for cataract surgery.  She  will contact us if they are not able to use the Port-A-Cath and we will arrange for Port-A-Cath removal.  Gary Sherrill, MD  08/07/2020  1:02 PM   

## 2020-08-24 ENCOUNTER — Ambulatory Visit
Admission: RE | Admit: 2020-08-24 | Discharge: 2020-08-24 | Disposition: A | Discharge status: Home / Self Care | Payer: BC Managed Care – PPO | Source: Ambulatory Visit | Attending: Nurse Practitioner | Admitting: Nurse Practitioner

## 2020-08-24 ENCOUNTER — Other Ambulatory Visit: Payer: Self-pay

## 2020-08-24 DIAGNOSIS — Z1231 Encounter for screening mammogram for malignant neoplasm of breast: Secondary | ICD-10-CM

## 2020-09-29 ENCOUNTER — Other Ambulatory Visit: Payer: Self-pay

## 2020-09-29 ENCOUNTER — Encounter: Payer: Self-pay | Admitting: Medical

## 2020-09-29 ENCOUNTER — Ambulatory Visit: Payer: BC Managed Care – PPO | Admitting: Medical

## 2020-09-29 DIAGNOSIS — J309 Allergic rhinitis, unspecified: Secondary | ICD-10-CM

## 2020-09-29 DIAGNOSIS — J4540 Moderate persistent asthma, uncomplicated: Secondary | ICD-10-CM | POA: Diagnosis not present

## 2020-09-29 DIAGNOSIS — R739 Hyperglycemia, unspecified: Secondary | ICD-10-CM

## 2020-09-29 DIAGNOSIS — K219 Gastro-esophageal reflux disease without esophagitis: Secondary | ICD-10-CM

## 2020-09-29 DIAGNOSIS — Z85038 Personal history of other malignant neoplasm of large intestine: Secondary | ICD-10-CM

## 2020-09-29 DIAGNOSIS — R5383 Other fatigue: Secondary | ICD-10-CM

## 2020-09-29 NOTE — Patient Instructions (Addendum)
History of obesity and you do desire weight loss.  We will go ahead and refer you to weight management clinic.  History of allergic rhinitis.  Controlled with Xyzal, montelukast and Flonase nasal spray.  History of asthma.  By chart review described as moderate but today we discussed good control with Symbicort.  Glad to hear that you do not have to resort to albuterol use.  History of GERD that seems largely controlled most the time.  Could continue.  Use of over-the-counter PPI that she got from [facility].  When you see your GI would inquire whether or not she thinks EGD is indicated.  History of elevated blood sugar.  Will get A1c on upcoming wellness/physical exam.  Eat low sugar diet during the interim.  History of colon cancer.  Followed by GI and oncology recommendation.  CT came back showing no metastasis.  Report mild fatigue.  We will plan to add TSH, T4, B12, B1 and vitamin D to wellness labs.  Follow-up in 3 weeks or as needed.

## 2020-09-29 NOTE — Progress Notes (Signed)
Subjective:    Patient ID: Sharon Allison, female    DOB: July 01, 1956, 65 y.o.   MRN: [id number]  HPI  Pt in for the first time.    Pt sees gyn regularly. Pt has not had pcp for some time.  Pt gyn just retired. Had FP about 10 years ago.  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 Pt admits not healthy diet. Non smoker. No alcohol use.   Pt interested in weight loss/obese. Admits high sugar diet. Willing to be referred for weigh loss.  Pt has allergic rhinitis hx(controlled). She uses xyzal 5 mg daily. flonase 50 mcg spray. Also on monelukast 10 mg. Spring worse season.  Pt had phizer vaccine x 3. Flu vaccine up to date.  Asthma history. Uses symbicort 160 -4.5 2 inh twice daily. Has albuterol if needed but never uses.  Hx of gerd. Most days controlled. Recent mild symptoms. Has lansoprazole.  Hx of colon cancer. Pt has been treated. Recent follow Ct below. Pt GI not with cone.  IMPRESSION: 1. Status post right hemicolectomy. No evidence for metastatic disease in the chest, abdomen or pelvis. 2. Oral contrast material within the esophagus suggestive of reflux. 3. Nonobstructing left renal stone. 4. Aortic atherosclerosis.   Hx of elevated sugar. No known diabetes. Mammogram up to date.   Pt had pap in 2020.   Pt does state she is very difficult lab draw. She has porta-cath.    Review of Systems  Constitutional: Negative for chills, fatigue and fever.  HENT: Negative for dental problem.   Respiratory: Negative for cough, chest tightness, shortness of breath and wheezing.   Cardiovascular: Negative for chest pain and palpitations.  Gastrointestinal: Negative for abdominal pain, blood in stool and constipation.  Endocrine: Negative for polydipsia and polyuria.  Genitourinary: Negative for dysuria and enuresis.  Musculoskeletal: Negative for back pain.  Skin: Negative for rash.  Neurological: Negative for dizziness, numbness and  headaches.  Hematological: Negative for adenopathy.  Psychiatric/Behavioral: Negative for behavioral problems and confusion.    Past Medical History:  Diagnosis Date  . Anemia   . Asthma   . colon ca dx'd 09/2017   colon cancer  . Colon polyp   . Diabetes mellitus without complication ([place])   . Fibroid   . GERD (gastroesophageal reflux disease)   . Heart murmur    per patient " dr white told me i had a murmur but i told him they told me that when i was a kid and it had never bothered me "   . Hypertension   . Sleep apnea      Social History   Socioeconomic History  . Marital status: Divorced    Spouse name: Not on file  . Number of children: Not on file  . Years of education: Not on file  . Highest education level: Not on file  Occupational History  . Not on file  Tobacco Use  . Smoking status: Never Smoker  . Smokeless tobacco: Never Used  Vaping Use  . Vaping Use: Never used  Substance and Sexual Activity  . Alcohol use: Not Currently    Alcohol/week: 0.0 standard drinks  . Drug use: No  . Sexual activity: Not Currently    Partners: Male    Birth control/protection: Surgical    Comment: TAH  Other Topics Concern  . Not on file  Social History Narrative  . Not on file   Social Determinants of Health  Financial Resource Strain: Not on file  Food Insecurity: Not on file  Transportation Needs: Not on file  Physical Activity: Not on file  Stress: Not on file  Social Connections: Not on file  Intimate Partner Violence: Not on file    Past Surgical History:  Procedure Laterality Date  . ABDOMINAL HYSTERECTOMY     2002  . COLONOSCOPY N/A 09/18/2017   Procedure: COLONOSCOPY;  Surgeon: Ileana Roup, MD;  Location: [facility];  Service: General;  Laterality: N/A;  . COLONOSCOPY WITH PROPOFOL N/A 06/30/2018   Procedure: COLONOSCOPY WITH PROPOFOL;  Surgeon: Juanita Craver, MD;  Location: [facility];  Service: Endoscopy;  Laterality: N/A;  . LAPAROSCOPIC  RIGHT HEMI COLECTOMY Right 09/18/2017   Procedure: LAPAROSCOPIC RIGHT HEMI COLECTOMY ERAS PATHWAY;  Surgeon: Ileana Roup, MD;  Location: [facility];  Service: General;  Laterality: Right;  . MYOMECTOMY  DONE 5 YEARS PRIOR TO HYSTERECTOMY   multiple fibroids  . POLYPECTOMY  06/30/2018   Procedure: POLYPECTOMY;  Surgeon: Juanita Craver, MD;  Location: [facility];  Service: Endoscopy;;  . PORTACATH PLACEMENT N/A 10/17/2017   Procedure: INSERTION PORT-A-CATH;  Surgeon: Ileana Roup, MD;  Location: [facility];  Service: General;  Laterality: N/A;    Family History  Problem Relation Age of Onset  . Hypertension Mother   . Leukemia Mother   . Lupus Father   . Hodgkin's lymphoma Maternal Grandmother   . Cancer Maternal Grandfather        prostate  . Leukemia Sister   . Kidney disease Brother   . Leukemia Brother   . Allergic rhinitis Neg Hx   . Asthma Neg Hx   . Eczema Neg Hx   . Urticaria Neg Hx     Allergies  Allergen Reactions  . Oxaliplatin Shortness Of Breath    Sweating   . Iohexol Swelling    Patient had swelling and itching to face in 1980's. Had 13 hour pre meds before scan 10/27/17  . Iodine Swelling  . Sulfa Antibiotics Itching    Current Outpatient Medications on File Prior to Visit  Medication Sig Dispense Refill  . budesonide-formoterol (SYMBICORT) 160-4.5 MCG/ACT inhaler Inhale 2 puffs into the lungs 2 (two) times daily. 10.2 g 5  . Cholecalciferol (VITAMIN D3) 1000 units CAPS Take 1,000 Units by mouth daily.     . diphenhydrAMINE (BENADRYL) 25 MG tablet Take 50 mg by mouth daily as needed for itching.    . fluticasone (FLONASE) 50 MCG/ACT nasal spray Place 2 sprays into both nostrils daily. As needed for nasal congestion. 16 g 5  . levocetirizine (XYZAL) 5 MG tablet Take 1 tablet (5 mg total) by mouth every evening. 30 tablet 5  . montelukast (SINGULAIR) 10 MG tablet Take 1 tablet (10 mg total) by mouth at bedtime. 30 tablet 5  . albuterol (VENTOLIN HFA) 108  (90 Base) MCG/ACT inhaler Inhale 2 puffs into the lungs every 4 (four) hours as needed for wheezing or shortness of breath. (Patient not taking: No sig reported) 18 g 1   Current Facility-Administered Medications on File Prior to Visit  Medication Dose Route Frequency Provider Last Rate Last Admin  . famotidine (PEPCID) IVPB 20 mg premix  20 mg Intravenous Q12H Tanner, Lucianne Lei E., PA-C   Stopped at 01/21/18 1528  . heparin lock flush 100 unit/mL  500 Units Intracatheter Once PRN Betsy Coder B, MD      . sodium chloride flush (NS) 0.9 % injection 10 mL  10 mL Intracatheter  PRN Ladell Pier, MD        BP (!) 146/69   Pulse 79   Temp 98.5 F (36.9 C) (Oral)   Resp 20   Ht 5\' 3"  (1.6 m)   Wt 245 lb (111.1 kg)   LMP 07/10/2001 (LMP Unknown)   SpO2 95%   BMI 43.40 kg/m       Objective:   Physical Exam  General Mental Status- Alert. General Appearance- Not in acute distress.   Skin General: Color- Normal Color. Moisture- Normal Moisture.  Neck Carotid Arteries- Normal color. Moisture- Normal Moisture. No carotid bruits. No JVD.  Chest and Lung Exam Auscultation: Breath Sounds:-Normal.  Cardiovascular Auscultation:Rythm- Regular. Murmurs & Other Heart Sounds:Auscultation of the heart reveals- No Murmurs.  Abdomen Inspection:-Inspeection Normal. Palpation/Percussion:Note:No mass. Palpation and Percussion of the abdomen reveal- Non Tender, Non Distended + BS, no rebound or guarding.    Neurologic Cranial Nerve exam:- CN III-XII intact(No nystagmus), symmetric smile. Strength:- 5/5 equal and symmetric strength both upper and lower extremities.      Assessment & Plan:  History of obesity and you do desire weight loss.  We will go ahead and refer you to weight management clinic.  History of allergic rhinitis.  Controlled with Xyzal, montelukast and Flonase nasal spray.  History of asthma.  By chart review described as moderate but today we discussed good control with  Symbicort.  Glad to hear that you do not have to resort to albuterol use.  History of GERD that seems largely controlled most the time.  Could continue.  Use of over-the-counter PPI that she got from [facility].  When you see your GI would inquire whether or not she thinks EGD is indicated.  History of elevated blood sugar.  Will get A1c on upcoming wellness/physical exam.  Eat low sugar diet during the interim.  History of colon cancer.  Followed by GI and oncology recommendation.  CT came back showing no metastasis.  Report mild fatigue.  We will plan to add TSH, T4, B12, B1 and vitamin D to wellness labs.  Follow-up in 3 weeks or as needed.  Mackie Pai, PA-C

## 2020-10-02 ENCOUNTER — Other Ambulatory Visit: Payer: Self-pay

## 2020-10-02 ENCOUNTER — Inpatient Hospital Stay: Payer: BC Managed Care – PPO | Attending: Oncology

## 2020-10-02 DIAGNOSIS — Z95828 Presence of other vascular implants and grafts: Secondary | ICD-10-CM

## 2020-10-02 DIAGNOSIS — C182 Malignant neoplasm of ascending colon: Secondary | ICD-10-CM

## 2020-10-02 MED ORDER — SODIUM CHLORIDE 0.9% FLUSH
10.0000 mL | Freq: Once | INTRAVENOUS | Status: AC
  Administered 2020-10-02: 10 mL
  Filled 2020-10-02: qty 10

## 2020-10-02 MED ORDER — HEPARIN SOD (PORK) LOCK FLUSH 100 UNIT/ML IV SOLN
500.0000 [IU] | Freq: Once | INTRAVENOUS | Status: AC
  Administered 2020-10-02: 500 [IU]
  Filled 2020-10-02: qty 5

## 2020-10-02 NOTE — Patient Instructions (Signed)

## 2020-10-17 ENCOUNTER — Telehealth: Payer: Self-pay | Admitting: *Deleted

## 2020-10-17 NOTE — Telephone Encounter (Addendum)
Pt has lab appt on Friday but there are no orders in Epic.  Please place orders if appropriate or call pt to cancel if labs not needed at this time.  Future labs placed.

## 2020-10-17 NOTE — Addendum Note (Signed)
Addended by: Anabel Halon on: 10/17/2020 05:29 PM   Modules accepted: Orders

## 2020-10-17 NOTE — Addendum Note (Signed)
Addended by: Anabel Halon on: 10/17/2020 05:31 PM   Modules accepted: Orders

## 2020-10-18 NOTE — Telephone Encounter (Signed)
Orders placed by PCP yesterday.

## 2020-10-20 ENCOUNTER — Telehealth: Payer: Self-pay | Admitting: Medical

## 2020-10-20 ENCOUNTER — Other Ambulatory Visit: Payer: Self-pay

## 2020-10-20 ENCOUNTER — Other Ambulatory Visit (INDEPENDENT_AMBULATORY_CARE_PROVIDER_SITE_OTHER): Payer: BC Managed Care – PPO

## 2020-10-20 DIAGNOSIS — R5383 Other fatigue: Secondary | ICD-10-CM

## 2020-10-20 DIAGNOSIS — R739 Hyperglycemia, unspecified: Secondary | ICD-10-CM

## 2020-10-20 LAB — CBC WITH DIFFERENTIAL/PLATELET
Basophils Absolute: 0 10*3/uL (ref 0.0–0.1)
Basophils Relative: 0.9 % (ref 0.0–3.0)
Eosinophils Absolute: 0.2 10*3/uL (ref 0.0–0.7)
Eosinophils Relative: 2.8 % (ref 0.0–5.0)
HCT: 42 % (ref 36.0–46.0)
Hemoglobin: 13.8 g/dL (ref 12.0–15.0)
Lymphocytes Relative: 37.7 % (ref 12.0–46.0)
Lymphs Abs: 2.1 10*3/uL (ref 0.7–4.0)
MCHC: 32.9 g/dL (ref 30.0–36.0)
MCV: 91.7 fl (ref 78.0–100.0)
Monocytes Absolute: 0.4 10*3/uL (ref 0.1–1.0)
Monocytes Relative: 7.4 % (ref 3.0–12.0)
Neutro Abs: 2.9 10*3/uL (ref 1.4–7.7)
Neutrophils Relative %: 51.2 % (ref 43.0–77.0)
Platelets: 250 10*3/uL (ref 150.0–400.0)
RBC: 4.58 Mil/uL (ref 3.87–5.11)
RDW: 13.6 % (ref 11.5–15.5)
WBC: 5.6 10*3/uL (ref 4.0–10.5)

## 2020-10-20 LAB — LIPID PANEL
Cholesterol: 210 mg/dL — ABNORMAL HIGH (ref 0–200)
HDL: 47.2 mg/dL (ref >39.00)
LDL Cholesterol: 127 mg/dL — ABNORMAL HIGH (ref 0–99)
NonHDL: 162.63
Total CHOL/HDL Ratio: 4
Triglycerides: 176 mg/dL — ABNORMAL HIGH (ref 0.0–149.0)
VLDL: 35.2 mg/dL (ref 0.0–40.0)

## 2020-10-20 LAB — COMPREHENSIVE METABOLIC PANEL
ALT: 20 U/L (ref 0–35)
AST: 17 U/L (ref 0–37)
Albumin: 4 g/dL (ref 3.5–5.2)
Alkaline Phosphatase: 82 U/L (ref 39–117)
BUN: 13 mg/dL (ref 6–23)
CO2: 30 mEq/L (ref 19–32)
Calcium: 10.7 mg/dL — ABNORMAL HIGH (ref 8.4–10.5)
Chloride: 100 mEq/L (ref 96–112)
Creatinine, Ser: 0.76 mg/dL (ref 0.40–1.20)
GFR: 82.85 mL/min (ref >60.00)
Glucose, Bld: 95 mg/dL (ref 70–99)
Potassium: 4.1 mEq/L (ref 3.5–5.1)
Sodium: 137 mEq/L (ref 135–145)
Total Bilirubin: 0.5 mg/dL (ref 0.2–1.2)
Total Protein: 6.9 g/dL (ref 6.0–8.3)

## 2020-10-20 LAB — HEMOGLOBIN A1C: Hgb A1c MFr Bld: 5.4 % (ref 4.6–6.5)

## 2020-10-20 LAB — T4, FREE: Free T4: 0.91 ng/dL (ref 0.60–1.60)

## 2020-10-20 LAB — TSH: TSH: 2.09 u[IU]/mL (ref 0.35–4.50)

## 2020-10-20 LAB — VITAMIN B12: Vitamin B-12: 634 pg/mL (ref 211–911)

## 2020-10-20 MED ORDER — ATORVASTATIN CALCIUM 10 MG PO TABS: 10.0000 mg | ORAL_TABLET | Freq: Every day | ORAL | 3 refills | Status: DC

## 2020-10-20 NOTE — Telephone Encounter (Signed)
Prescription of atorvastatin sent to patient's pharmacy.

## 2020-10-25 LAB — VITAMIN D 1,25 DIHYDROXY
Vitamin D 1, 25 (OH)2 Total: 42 pg/mL (ref 18–72)
Vitamin D2 1, 25 (OH)2: <8 pg/mL
Vitamin D3 1, 25 (OH)2: 42 pg/mL

## 2020-10-25 LAB — VITAMIN B1: Vitamin B1 (Thiamine): 13 nmol/L (ref 8–30)

## 2020-11-27 ENCOUNTER — Inpatient Hospital Stay: Payer: BC Managed Care – PPO | Attending: Oncology

## 2020-11-27 ENCOUNTER — Other Ambulatory Visit: Payer: Self-pay

## 2020-11-27 DIAGNOSIS — Z95828 Presence of other vascular implants and grafts: Secondary | ICD-10-CM

## 2020-11-27 DIAGNOSIS — C182 Malignant neoplasm of ascending colon: Secondary | ICD-10-CM | POA: Diagnosis present

## 2020-11-27 DIAGNOSIS — Z452 Encounter for adjustment and management of vascular access device: Secondary | ICD-10-CM | POA: Diagnosis not present

## 2020-11-27 MED ORDER — HEPARIN SOD (PORK) LOCK FLUSH 100 UNIT/ML IV SOLN
500.0000 [IU] | Freq: Once | INTRAVENOUS | Status: AC
  Administered 2020-11-27: 500 [IU] via INTRAVENOUS
  Filled 2020-11-27: qty 5

## 2020-11-27 MED ORDER — SODIUM CHLORIDE 0.9% FLUSH
10.0000 mL | INTRAVENOUS | Status: DC | PRN
  Administered 2020-11-27: 10 mL via INTRAVENOUS
  Filled 2020-11-27: qty 10

## 2020-11-27 NOTE — Patient Instructions (Signed)
Implanted Port Insertion, Care After This sheet gives you information about how to care for yourself after your procedure. Your health care provider may also give you more specific instructions. If you have problems or questions, contact your health care provider. What can I expect after the procedure? After the procedure, it is common to have:  Discomfort at the port insertion site.  Bruising on the skin over the port. This should improve over 3-4 days. Follow these instructions at home: Port care  After your port is placed, you will get a manufacturer's information card. The card has information about your port. Keep this card with you at all times.  Take care of the port as told by your health care provider. Ask your health care provider if you or a family member can get training for taking care of the port at home. A home health care nurse may also take care of the port.  Make sure to remember what type of port you have. Incision care  Follow instructions from your health care provider about how to take care of your port insertion site. Make sure you: ? Wash your hands with soap and water before and after you change your bandage (dressing). If soap and water are not available, use hand sanitizer. ? Change your dressing as told by your health care provider. ? Leave stitches (sutures), skin glue, or adhesive strips in place. These skin closures may need to stay in place for 2 weeks or longer. If adhesive strip edges start to loosen and curl up, you may trim the loose edges. Do not remove adhesive strips completely unless your health care provider tells you to do that.  Check your port insertion site every day for signs of infection. Check for: ? Redness, swelling, or pain. ? Fluid or blood. ? Warmth. ? Pus or a bad smell.      Activity  Return to your normal activities as told by your health care provider. Ask your health care provider what activities are safe for you.  Do not  lift anything that is heavier than 10 lb (4.5 kg), or the limit that you are told, until your health care provider says that it is safe. General instructions  Take over-the-counter and prescription medicines only as told by your health care provider.  Do not take baths, swim, or use a hot tub until your health care provider approves. Ask your health care provider if you may take showers. You may only be allowed to take sponge baths.  Do not drive for 24 hours if you were given a sedative during your procedure.  Wear a medical alert bracelet in case of an emergency. This will tell any health care providers that you have a port.  Keep all follow-up visits as told by your health care provider. This is important. Contact a health care provider if:  You cannot flush your port with saline as directed, or you cannot draw blood from the port.  You have a fever or chills.  You have redness, swelling, or pain around your port insertion site.  You have fluid or blood coming from your port insertion site.  Your port insertion site feels warm to the touch.  You have pus or a bad smell coming from the port insertion site. Get help right away if:  You have chest pain or shortness of breath.  You have bleeding from your port that you cannot control. Summary  Take care of the port as told by your   health care provider. Keep the manufacturer's information card with you at all times.  Change your dressing as told by your health care provider.  Contact a health care provider if you have a fever or chills or if you have redness, swelling, or pain around your port insertion site.  Keep all follow-up visits as told by your health care provider. This information is not intended to replace advice given to you by your health care provider. Make sure you discuss any questions you have with your health care provider. Document Revised: 03/24/2018 Document Reviewed: 03/24/2018 Elsevier Patient Education   2021 Elsevier Inc.  

## 2020-12-05 ENCOUNTER — Telehealth: Payer: Self-pay | Admitting: Oncology

## 2020-12-05 ENCOUNTER — Encounter (INDEPENDENT_AMBULATORY_CARE_PROVIDER_SITE_OTHER): Payer: Self-pay

## 2020-12-05 NOTE — Telephone Encounter (Signed)
Updated appointments at [facility] letter & calendar was mailed  

## 2020-12-27 ENCOUNTER — Ambulatory Visit: Payer: BC Managed Care – PPO | Admitting: Allergy

## 2021-01-01 ENCOUNTER — Ambulatory Visit: Payer: BC Managed Care – PPO | Admitting: Allergy

## 2021-01-22 ENCOUNTER — Inpatient Hospital Stay: Payer: BC Managed Care – PPO | Attending: Oncology | Admitting: Nurse Practitioner

## 2021-01-22 ENCOUNTER — Inpatient Hospital Stay: Payer: BC Managed Care – PPO

## 2021-01-22 ENCOUNTER — Other Ambulatory Visit: Payer: Self-pay

## 2021-01-22 ENCOUNTER — Encounter: Payer: Self-pay | Admitting: Nurse Practitioner

## 2021-01-22 ENCOUNTER — Other Ambulatory Visit (HOSPITAL_BASED_OUTPATIENT_CLINIC_OR_DEPARTMENT_OTHER): Payer: BC Managed Care – PPO

## 2021-01-22 ENCOUNTER — Other Ambulatory Visit: Payer: BC Managed Care – PPO

## 2021-01-22 VITALS — BP 143/70 | HR 62 | Temp 97.7°F | Resp 18 | Ht 63.0 in | Wt 243.8 lb

## 2021-01-22 DIAGNOSIS — R197 Diarrhea, unspecified: Secondary | ICD-10-CM | POA: Insufficient documentation

## 2021-01-22 DIAGNOSIS — Z9049 Acquired absence of other specified parts of digestive tract: Secondary | ICD-10-CM | POA: Insufficient documentation

## 2021-01-22 DIAGNOSIS — I959 Hypotension, unspecified: Secondary | ICD-10-CM | POA: Insufficient documentation

## 2021-01-22 DIAGNOSIS — J45909 Unspecified asthma, uncomplicated: Secondary | ICD-10-CM | POA: Insufficient documentation

## 2021-01-22 DIAGNOSIS — C182 Malignant neoplasm of ascending colon: Secondary | ICD-10-CM

## 2021-01-22 DIAGNOSIS — D6959 Other secondary thrombocytopenia: Secondary | ICD-10-CM | POA: Insufficient documentation

## 2021-01-22 DIAGNOSIS — T451X5A Adverse effect of antineoplastic and immunosuppressive drugs, initial encounter: Secondary | ICD-10-CM | POA: Diagnosis not present

## 2021-01-22 DIAGNOSIS — Z95828 Presence of other vascular implants and grafts: Secondary | ICD-10-CM

## 2021-01-22 DIAGNOSIS — K760 Fatty (change of) liver, not elsewhere classified: Secondary | ICD-10-CM | POA: Diagnosis not present

## 2021-01-22 DIAGNOSIS — Z79899 Other long term (current) drug therapy: Secondary | ICD-10-CM | POA: Insufficient documentation

## 2021-01-22 DIAGNOSIS — R000 Tachycardia, unspecified: Secondary | ICD-10-CM | POA: Diagnosis not present

## 2021-01-22 DIAGNOSIS — D123 Benign neoplasm of transverse colon: Secondary | ICD-10-CM | POA: Diagnosis not present

## 2021-01-22 LAB — CEA (ACCESS): CEA (CHCC): 1.36 ng/mL (ref 0.00–5.00)

## 2021-01-22 MED ORDER — HEPARIN SOD (PORK) LOCK FLUSH 100 UNIT/ML IV SOLN
500.0000 [IU] | Freq: Once | INTRAVENOUS | Status: AC
  Administered 2021-01-22: 500 [IU]
  Filled 2021-01-22: qty 5

## 2021-01-22 MED ORDER — SODIUM CHLORIDE 0.9% FLUSH
10.0000 mL | Freq: Once | INTRAVENOUS | Status: AC
  Administered 2021-01-22: 10 mL
  Filled 2021-01-22: qty 10

## 2021-01-22 NOTE — Patient Instructions (Signed)

## 2021-01-22 NOTE — Progress Notes (Signed)
  [place] OFFICE PROGRESS NOTE   Diagnosis: Colon cancer  INTERVAL HISTORY:   Sharon Allison returns as scheduled.  She feels well.  No change in bowel habits.  No bleeding or pain with bowel movements.  She denies nausea/vomiting.  No abdominal pain.  She has a good appetite.  Objective:  Vital signs in last 24 hours:  Blood pressure (!) 143/70, pulse 62, temperature 97.7 F (36.5 C), temperature source Oral, resp. rate 18, height _0  (1.6 m), weight 243 lb 12.8 oz (110.6 kg), last menstrual period 07/10/2001, SpO2 98 %.    HEENT: Neck without mass. Lymphatics: No palpable cervical, supraclavicular, axillary or inguinal lymph nodes. Resp: Lungs clear bilaterally. Cardio: Regular rate and rhythm. GI: Abdomen soft and nontender.  No hepatomegaly. Vascular: No leg edema.  Port-A-Cath without erythema.  Lab Results:  Lab Results  Component Value Date   WBC 5.6 10/20/2020   HGB 13.8 10/20/2020   HCT 42.0 10/20/2020   MCV 91.7 10/20/2020   PLT 250.0 10/20/2020   NEUTROABS 2.9 10/20/2020    Imaging:  No results found.  Medications: I have reviewed the patient's current medications.  Assessment/Plan: 1. 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 MSI-stable ? 3/14 lymph nodes positive for metastatic carcinoma, lymphovascular invasion present ? Cycle 1 FOLFOX 10/22/2017 ? CTs chest, abdomen, and pelvis 10/28/2017-negative for metastatic disease ? Cycle 2 FOLFOX 11/05/2017 ? Cycle 3 FOLFOX 11/27/2017 ? Cycle 4 FOLFOX 12/10/2017 ? Cycle 5 FOLFOX 12/24/2017 ? Cycle 6 FOLFOX 01/08/2018 (Oxaliplatin held due to thrombocytopenia) ? Cycle 7 FOLFOX 01/21/2018-oxaliplatin resumed ? Cycle 8 FOLFOX 02/04/2018-Oxaliplatin eliminated due to an allergic reaction with cycle 7 ? Cycle 9 FOLFOX6/26/2019-no Oxaliplatin; 5-FU bolus eliminated, infusional 5-FU dose reduced ? Cycle 10 FOLFOX 03/17/2018 ? Cycle 11 FOLFOX  03/31/2018 ? Cycle 12 FOLFOX 04/14/2018 ? Surveillance colonoscopy 06/30/2018-tubular adenoma removed from the transverse colon ? CTs 08/25/2018-negative ? CTs 08/13/2019-no evidence of recurrent colon cancer, focal fatty change in the left liver ? CTs 08/02/2020-no evidence of recurrent disease  2.Asthma  3.Port-A-Cath placement 10/17/2017  4.History of neutropenia secondary to chemotherapy-resolved  5.History of thrombocytopenia secondary to chemotherapy  6.Allergic reaction to Oxaliplatin 01/21/2018-pruritus, tachycardia, diaphoresis, hypotension  7.Diarrhea following cycle 8 FOLFOX-resolved, 5-FU bolus eliminated and infusion dose decreased beginning with cycle 9   Disposition: Sharon Allison remains in clinical remission from colon cancer.  We will follow-up on the CEA from today.  She will continue surveillance colonoscopy with Dr. Collene Mares, next due October of this year.  She would like to have the Port-A-Cath removed.  We made a referral to Dr. Dema Severin.  She will return for CEA and follow-up visit in 6 months   Ned Card ANP/GNP-BC   01/22/2021  1:53 PM

## 2021-01-23 LAB — CEA (IN HOUSE-CHCC): CEA (CHCC-In House): 1.09 ng/mL (ref 0.00–5.00)

## 2021-01-29 ENCOUNTER — Encounter: Payer: Self-pay | Admitting: Oncology

## 2021-01-30 ENCOUNTER — Encounter: Payer: Self-pay | Admitting: *Deleted

## 2021-01-30 NOTE — Progress Notes (Signed)
Faxed referral orders, demographics and chart information to Dr. Annye English requesting port removal and Dr. Verdia Kuba requesting colonoscopy needed 2002 per Dr. Benay Spice.

## 2021-01-30 NOTE — Progress Notes (Signed)
Follow Up Note  RE: Sharon Allison MRN: [id number] DOB: Jul 17, 1956 Date of Office Visit: 01/31/2021   Referring provider: Regina Eck, CNM Primary care provider: Mackie Pai, PA-C  Chief Complaint: Asthma (ACT - 25  she left her inhaler in the car yesterday and when she went through her purse it was super hot. She was wondering if its ok to still use it. ) and Other (Would like to know if she should get the COVID booster shot )  History of Present Illness: I had the pleasure of seeing Sharon Allison for a follow up visit at the [facility] on 01/31/2021. She is a 65 y.o. female, who is being followed for asthma and chronic rhinitis. Her previous allergy office visit was on 06/28/2020 with Dr. Maudie Mercury. Today is a regular follow up visit.  Moderate persistent asthma Denies any wheezing, chest tightness, nocturnal awakenings, ER/urgent care visits or prednisone use since the last visit. Some shortness of breath with exertion.   Currently on Symbicort 167mcg 2 puffs once a day and rinsing mouth after each use.  Not using albuterol at all.  Chronic rhinitis Taking Xyzal daily and Singulair as needed with good benefit. Not needed to use nasal sprays or eye drops.   Assessment and Plan: Sharon Allison is a 65 y.o. female with: Moderate persistent asthma without complication Stable with below regimen.   . 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  . ACT score 25. . Daily controller medication(s): continue Symbicort 155mcg 2 puffs once a day. . Prior to physical activity: May use albuterol rescue inhaler 2 puffs 5 to 15 minutes prior to strenuous physical activities. Marland Kitchen Rescue medications: May use albuterol rescue inhaler 2 puffs or nebulizer every 4 to 6 hours as needed for shortness of breath, chest tightness, coughing, and wheezing. Monitor frequency of use.  . During upper respiratory infections/asthma flares: Start Singulair 10mg  daily.  o Increase Symbicort 132mcg to 2 puffs  twice a day for 1-2 weeks.  Chronic rhinitis Stable with Xyzal daily and Singulair prn use.  Use over the counter antihistamines such as Zyrtec (cetirizine), Claritin (loratadine), Allegra (fexofenadine), or Xyzal (levocetirizine) daily as needed. May switch antihistamines every few months.  May use Flonase 2 sprays per nostril daily for nasal congestion.  May use azelastine nasal spray 1-2 spays per nostril 1-2 times a day as needed for drainage.  May use Zaditor eye drops as needed 1-2 times a day for itchy/watery eyes.   Encounter for immunization  Second booster [employer] vaccine given today in the office.  Return in about 6 months (around 08/03/2021).  No orders of the defined types were placed in this encounter.  Lab Orders  No laboratory test(s) ordered today    Diagnostics: Spirometry:  Tracings reviewed. Her effort: Good reproducible efforts. FVC: 1.81L FEV1: 1.47L, 75% predicted FEV1/FVC ratio: 81%  Interpretation: Spirometry consistent with normal pattern.  Please see scanned spirometry results for details.  Medication List:  Current Outpatient Medications  Medication Sig Dispense Refill  . albuterol (VENTOLIN HFA) 108 (90 Base) MCG/ACT inhaler Inhale 2 puffs into the lungs every 4 (four) hours as needed for wheezing or shortness of breath. 18 g 1  . atorvastatin (LIPITOR) 10 MG tablet Take 1 tablet (10 mg total) by mouth daily. 30 tablet 3  . budesonide-formoterol (SYMBICORT) 160-4.5 MCG/ACT inhaler Inhale 2 puffs into the lungs 2 (two) times daily. 10.2 g 5  . Cholecalciferol (VITAMIN D3) 1000 units CAPS Take 1,000 Units by mouth  daily.     . diphenhydrAMINE (BENADRYL) 25 MG tablet Take 50 mg by mouth daily as needed for itching.    . fluticasone (FLONASE) 50 MCG/ACT nasal spray Place 2 sprays into both nostrils daily. As needed for nasal congestion. 16 g 5  . levocetirizine (XYZAL) 5 MG tablet Take 1 tablet (5 mg total) by mouth every evening. 30 tablet 5  .  montelukast (SINGULAIR) 10 MG tablet Take 1 tablet (10 mg total) by mouth at bedtime. 30 tablet 5   No current facility-administered medications for this visit.   Facility-Administered Medications Ordered in Other Visits  Medication Dose Route Frequency Provider Last Rate Last Admin  . famotidine (PEPCID) IVPB 20 mg premix  20 mg Intravenous Q12H Tanner, Lucianne Lei E., PA-C   Stopped at 01/21/18 1528  . heparin lock flush 100 unit/mL  500 Units Intracatheter Once PRN Betsy Coder B, MD      . sodium chloride flush (NS) 0.9 % injection 10 mL  10 mL Intracatheter PRN Ladell Pier, MD       Allergies: Allergies  Allergen Reactions  . Oxaliplatin Shortness Of Breath    Sweating   . Iohexol Swelling    Patient had swelling and itching to face in 1980's. Had 13 hour pre meds before scan 10/27/17  . Iodine Swelling  . Sulfa Antibiotics Itching and Other (See Comments)   I reviewed her past medical history, social history, family history, and environmental history and no significant changes have been reported from her previous visit.  Review of Systems  Constitutional: Negative for appetite change, chills, fever and unexpected weight change.  HENT: Negative for congestion and rhinorrhea.   Eyes: Negative for itching.  Respiratory: Negative for cough, chest tightness, shortness of breath and wheezing.   Gastrointestinal: Negative for abdominal pain.  Skin: Negative for rash.  Neurological: Negative for headaches.   Objective: BP 134/80   Pulse 68   Temp 98.2 F (36.8 C)   Resp 16   Ht 5\' 2"  (1.575 m)   Wt 245 lb (111.1 kg)   LMP 07/10/2001 (LMP Unknown)   SpO2 96%   BMI 44.81 kg/m  Body mass index is 44.81 kg/m. Physical Exam Vitals and nursing note reviewed.  Constitutional:      Appearance: Normal appearance. She is well-developed.  HENT:     Head: Normocephalic and atraumatic.     Right Ear: External ear normal.     Left Ear: External ear normal.     Nose: Nose normal.      Mouth/Throat:     Mouth: Mucous membranes are moist.     Pharynx: Oropharynx is clear.  Eyes:     Conjunctiva/sclera: Conjunctivae normal.  Cardiovascular:     Rate and Rhythm: Normal rate and regular rhythm.     Heart sounds: Normal heart sounds. No murmur heard. No friction rub. No gallop.   Pulmonary:     Effort: Pulmonary effort is normal.     Breath sounds: Normal breath sounds. No wheezing or rales.  Musculoskeletal:     Cervical back: Neck supple.  Skin:    General: Skin is warm.     Findings: No rash.  Neurological:     Mental Status: She is alert and oriented to person, place, and time.  Psychiatric:        Behavior: Behavior normal.    Previous notes and tests were reviewed. The plan was reviewed with the patient/family, and all questions/concerned were addressed.  It was  my pleasure to see Sharon Allison today and participate in her care. Please feel free to contact me with any questions or concerns.  Sincerely,  Rexene Alberts, DO Allergy & Immunology  [facility] [place] office: [place] office: [phone]

## 2021-01-31 ENCOUNTER — Ambulatory Visit: Payer: BC Managed Care – PPO | Admitting: Allergy

## 2021-01-31 ENCOUNTER — Ambulatory Visit (INDEPENDENT_AMBULATORY_CARE_PROVIDER_SITE_OTHER): Payer: BC Managed Care – PPO | Admitting: *Deleted

## 2021-01-31 ENCOUNTER — Other Ambulatory Visit: Payer: Self-pay

## 2021-01-31 ENCOUNTER — Encounter: Payer: Self-pay | Admitting: Allergy

## 2021-01-31 VITALS — BP 134/80 | HR 68 | Temp 98.2°F | Resp 16 | Ht 62.0 in | Wt 245.0 lb

## 2021-01-31 DIAGNOSIS — Z23 Encounter for immunization: Secondary | ICD-10-CM

## 2021-01-31 DIAGNOSIS — J310 Chronic rhinitis: Secondary | ICD-10-CM | POA: Diagnosis not present

## 2021-01-31 DIAGNOSIS — J4540 Moderate persistent asthma, uncomplicated: Secondary | ICD-10-CM

## 2021-01-31 NOTE — Patient Instructions (Addendum)
Asthma: . Daily controller medication(s): continue Symbicort 161mcg 2 puffs once a day. . Prior to physical activity: May use albuterol rescue inhaler 2 puffs 5 to 15 minutes prior to strenuous physical activities. Marland Kitchen Rescue medications: May use albuterol rescue inhaler 2 puffs or nebulizer every 4 to 6 hours as needed for shortness of breath, chest tightness, coughing, and wheezing. Monitor frequency of use.  . During upper respiratory infections/asthma flares: Start Singulair 10mg  daily.  o Increase Symbicort 164mcg to 2 puffs twice a day for 1-2 weeks. . Asthma control goals:  o Full participation in all desired activities (may need albuterol before activity) o Albuterol use two times or less a week on average (not counting use with activity) o Cough interfering with sleep two times or less a month o Oral steroids no more than once a year o No hospitalizations  Rhinitis   Use over the counter antihistamines such as Zyrtec (cetirizine), Claritin (loratadine), Allegra (fexofenadine), or Xyzal (levocetirizine) daily as needed. May switch antihistamines every few months.  May use Flonase 2 sprays per nostril daily for nasal congestion.  May use azelastine nasal spray 1-2 spays per nostril 1-2 times a day as needed for drainage.  May use Zaditor eye drops as needed 1-2 times a day for itchy/watery eyes.   Follow up in 6 months or sooner if needed.  Pfizer 2nd booster dose given today.

## 2021-01-31 NOTE — Assessment & Plan Note (Signed)
Stable with below regimen.   . 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  . ACT score 25. . Daily controller medication(s): continue Symbicort 124mcg 2 puffs once a day. . Prior to physical activity: May use albuterol rescue inhaler 2 puffs 5 to 15 minutes prior to strenuous physical activities. Marland Kitchen Rescue medications: May use albuterol rescue inhaler 2 puffs or nebulizer every 4 to 6 hours as needed for shortness of breath, chest tightness, coughing, and wheezing. Monitor frequency of use.  . During upper respiratory infections/asthma flares: Start Singulair 10mg  daily.  o Increase Symbicort 175mcg to 2 puffs twice a day for 1-2 weeks.

## 2021-01-31 NOTE — Assessment & Plan Note (Signed)
Stable with Xyzal daily and Singulair prn use.  Use over the counter antihistamines such as Zyrtec (cetirizine), Claritin (loratadine), Allegra (fexofenadine), or Xyzal (levocetirizine) daily as needed. May switch antihistamines every few months.  May use Flonase 2 sprays per nostril daily for nasal congestion.  May use azelastine nasal spray 1-2 spays per nostril 1-2 times a day as needed for drainage.  May use Zaditor eye drops as needed 1-2 times a day for itchy/watery eyes.

## 2021-01-31 NOTE — Assessment & Plan Note (Signed)
   Second booster [employer] vaccine given today in the office.

## 2021-01-31 NOTE — Progress Notes (Signed)
   Covid-19 Vaccination Clinic  Name:  Sharon Allison    MRN: [id number] DOB: 12-25-1955  01/31/2021  Sharon Allison was observed post Covid-19 immunization for 15 minutes without incident. She was provided with Vaccine Information Sheet and instruction to access the V-Safe system.   Sharon Allison was instructed to call 911 with any severe reactions post vaccine: Marland Kitchen Difficulty breathing  . Swelling of face and throat  . A fast heartbeat  . A bad rash all over body  . Dizziness and weakness

## 2021-02-13 ENCOUNTER — Encounter (INDEPENDENT_AMBULATORY_CARE_PROVIDER_SITE_OTHER): Payer: Self-pay | Admitting: Bariatrics

## 2021-02-13 DIAGNOSIS — K219 Gastro-esophageal reflux disease without esophagitis: Secondary | ICD-10-CM | POA: Insufficient documentation

## 2021-02-13 DIAGNOSIS — Z85038 Personal history of other malignant neoplasm of large intestine: Secondary | ICD-10-CM | POA: Insufficient documentation

## 2021-02-14 ENCOUNTER — Encounter: Payer: Self-pay | Admitting: Oncology

## 2021-02-14 ENCOUNTER — Encounter (INDEPENDENT_AMBULATORY_CARE_PROVIDER_SITE_OTHER): Payer: Self-pay | Admitting: Bariatrics

## 2021-02-14 ENCOUNTER — Other Ambulatory Visit: Payer: Self-pay

## 2021-02-14 ENCOUNTER — Ambulatory Visit (INDEPENDENT_AMBULATORY_CARE_PROVIDER_SITE_OTHER): Payer: BC Managed Care – PPO | Admitting: Bariatrics

## 2021-02-14 ENCOUNTER — Other Ambulatory Visit (INDEPENDENT_AMBULATORY_CARE_PROVIDER_SITE_OTHER): Payer: Self-pay | Admitting: Bariatrics

## 2021-02-14 VITALS — BP 129/79 | HR 75 | Temp 97.7°F | Ht 62.0 in | Wt 239.0 lb

## 2021-02-14 DIAGNOSIS — Z9189 Other specified personal risk factors, not elsewhere classified: Secondary | ICD-10-CM

## 2021-02-14 DIAGNOSIS — R5383 Other fatigue: Secondary | ICD-10-CM

## 2021-02-14 DIAGNOSIS — J4540 Moderate persistent asthma, uncomplicated: Secondary | ICD-10-CM

## 2021-02-14 DIAGNOSIS — E559 Vitamin D deficiency, unspecified: Secondary | ICD-10-CM | POA: Diagnosis not present

## 2021-02-14 DIAGNOSIS — Z1331 Encounter for screening for depression: Secondary | ICD-10-CM

## 2021-02-14 DIAGNOSIS — R7309 Other abnormal glucose: Secondary | ICD-10-CM

## 2021-02-14 DIAGNOSIS — R0602 Shortness of breath: Secondary | ICD-10-CM

## 2021-02-14 DIAGNOSIS — I10 Essential (primary) hypertension: Secondary | ICD-10-CM | POA: Diagnosis not present

## 2021-02-14 DIAGNOSIS — Z6841 Body Mass Index (BMI) 40.0 and over, adult: Secondary | ICD-10-CM

## 2021-02-14 DIAGNOSIS — R011 Cardiac murmur, unspecified: Secondary | ICD-10-CM

## 2021-02-14 DIAGNOSIS — Z0289 Encounter for other administrative examinations: Secondary | ICD-10-CM

## 2021-02-14 DIAGNOSIS — G4733 Obstructive sleep apnea (adult) (pediatric): Secondary | ICD-10-CM

## 2021-02-14 DIAGNOSIS — R9431 Abnormal electrocardiogram [ECG] [EKG]: Secondary | ICD-10-CM

## 2021-02-15 LAB — INSULIN, RANDOM: INSULIN: 39.5 u[IU]/mL — ABNORMAL HIGH (ref 2.6–24.9)

## 2021-02-19 ENCOUNTER — Encounter (INDEPENDENT_AMBULATORY_CARE_PROVIDER_SITE_OTHER): Payer: Self-pay | Admitting: Bariatrics

## 2021-02-19 DIAGNOSIS — E8881 Metabolic syndrome: Secondary | ICD-10-CM | POA: Insufficient documentation

## 2021-02-20 NOTE — Progress Notes (Signed)
Dear Mackie Pai, PA-C,   Thank you for referring Sharon Allison to our clinic. The following note includes my evaluation and treatment recommendations.  Chief Complaint:   C[place] (MR# [id number]) is a 65 y.o. female who presents for evaluation and treatment of obesity and related comorbidities. Current BMI is Body mass index is 43.71 kg/m. Alizea has been struggling with her weight for many years and has been unsuccessful in either losing weight, maintaining weight loss, or reaching her healthy weight goal.  Barbaraann is currently in the action stage of change and ready to dedicate time achieving and maintaining a healthier weight. Jyrah is interested in becoming our patient and working on intensive lifestyle modifications including (but not limited to) diet and exercise for weight loss.  Florabel does like to cook sometimes and notes time and tiredness as obstacles. She snacks after dinner. She would like to be under 200 lbs.  Breona's habits were reviewed today and are as follows: her desired weight loss is 40 lbs, she started gaining weight about 15 years ago, her heaviest weight ever was 250 pounds, she has significant food cravings issues, she snacks frequently in the evenings, she skips meals frequently, she is frequently drinking liquids with calories, she frequently makes poor food choices, she frequently eats larger portions than normal, and she struggles with emotional eating.  Depression Screen Rashay's Food and Mood (modified PHQ-9) score was 19.  Depression screen [facility] 2/9 02/14/2021  Decreased Interest 3  Down, Depressed, Hopeless 1  PHQ - 2 Score 4  Altered sleeping 1  Tired, decreased energy 3  Change in appetite 3  Feeling bad or failure about yourself  2  Trouble concentrating 3  Moving slowly or fidgety/restless 3  Suicidal thoughts 0  PHQ-9 Score 19  Difficult doing work/chores Very difficult   Subjective:   1. Other fatigue Miniya admits to  daytime somnolence and denies waking up still tired. Patent has a history of symptoms of daytime fatigue. Verta generally gets 7 hours of sleep per night, and states that she has generally restful sleep. Snoring is present. Apneic episodes are not present. Epworth Sleepiness Score is 11.  2. Shortness of breath on exertion Cherise notes increasing shortness of breath with exercising and seems to be worsening over time with weight gain. She notes getting out of breath sooner with activity than she used to. This has gotten worse recently. Avira denies shortness of breath at rest or orthopnea.  3. Essential hypertension Xandra is not taking medication for hypertension.  4. OSA (obstructive sleep apnea) Ellyson's Epstein score is 21. She has a CPAP and wears it periodically.  5. Heart murmur EKG shows LVH criteria. Armiyah had a heart murmur in childhood that resolved, then it came back in 2019.  6. Moderate persistent asthma without complication Evangeline is taking Symbicort and albuterol (does not use).  7. Vitamin D deficiency Kazia is taking OTC Vit D 1,000 IU.  8. Abnormal EKG Sadi has a history of heart murmur. She has a history of asthma. EKG ed LVH criteria (elevated BP 129/79).  9. Elevated glucose Catelynn has a history of elevated glucose.  10. At risk for heart disease Alyson is at a higher than average risk for cardiovascular disease due to obesity.   Assessment/Plan:   1. Other fatigue Chistine does feel that her weight is causing her energy to be lower than it should be. Fatigue may be related to obesity, depression or many other  causes. Labs will be ordered, and in the meanwhile, Brittinie will focus on self care including making healthy food choices, increasing physical activity and focusing on stress reduction.  - EKG 12-Lead  2. Shortness of breath on exertion Lynix does feel that she gets out of breath more easily that she used to when she exercises. Particia's shortness of breath  appears to be obesity related and exercise induced. She has agreed to work on weight loss and gradually increase exercise to treat her exercise induced shortness of breath. Will continue to monitor closely.  3. Essential hypertension Akaisha is working on healthy weight loss and exercise to improve blood pressure control. We will watch for signs of hypotension as she continues her lifestyle modifications.  4. OSA (obstructive sleep apnea) Intensive lifestyle modifications are the first line treatment for this issue. We discussed several lifestyle modifications today and she will continue to work on diet, exercise and weight loss efforts. We will continue to monitor. Orders and follow up as documented in patient record. Start wearing CPAP nightly.  5. Heart murmur Follow up with PCP and Oncology/Hematology as needed.  6. Moderate persistent asthma without complication Continue current treatment plan.  7. Vitamin D deficiency Low Vitamin D level contributes to fatigue and are associated with obesity, breast, and colon cancer. She agrees to continue to take OTC  Vitamin D @1 ,000 IU and will follow-up for routine testing of Vitamin D, at least 2-3 times per year to avoid over-replacement.  8. Abnormal EKG Maeryn will work on diet and exercise. Refer if needed.  9. Elevated glucose Check labs today.  - Insulin, random  10. Screening for depression Chessa had a positive depression screening. Depression is commonly associated with obesity and often results in emotional eating behaviors. We will monitor this closely and work on CBT to help improve the non-hunger eating patterns. Referral to Psychology may be required if no improvement is seen as she continues in our clinic.  11. At risk for heart disease Laurene was given approximately 15 minutes of coronary artery disease prevention counseling today. She is 65 y.o. female and has risk factors for heart disease including obesity. We discussed  intensive lifestyle modifications today with an emphasis on specific weight loss instructions and strategies.   Repetitive spaced learning was employed today to elicit superior memory formation and behavioral change.  12. Obesity with current BMI of 43.9  Latona is currently in the action stage of change and her goal is to continue with weight loss efforts. I recommend Uriyah begin the structured treatment plan as follows:  She has agreed to the Category 3 Plan- 100 calories.  Meal plan 10/20/2020 labs reviewed with pt. No sugary drinks  Exercise goals: No exercise has been prescribed at this time.   Behavioral modification strategies: increasing lean protein intake, decreasing simple carbohydrates, increasing vegetables, increasing water intake, decreasing eating out, no skipping meals, meal planning and cooking strategies, keeping healthy foods in the home, and planning for success.  She was informed of the importance of frequent follow-up visits to maximize her success with intensive lifestyle modifications for her multiple health conditions. She was informed we would discuss her lab results at her next visit unless there is a critical issue that needs to be addressed sooner. Alexys agreed to keep her next visit at the agreed upon time to discuss these results.  Objective:   Blood pressure 129/79, pulse 75, temperature 97.7 F (36.5 C), height 5\' 2"  (1.575 m), weight 239 lb (108.4  kg), last menstrual period 07/10/2001, SpO2 96 %. Body mass index is 43.71 kg/m.  EKG: Abnormal, rate 72.  Indirect Calorimeter completed today shows a VO2 of 286 and a REE of 1991.  Her calculated basal metabolic rate is 3343 thus her basal metabolic rate is better than expected.  General: Cooperative, alert, well developed, in no acute distress. HEENT: Conjunctivae and lids unremarkable. Cardiovascular: Regular rhythm.  Lungs: Normal work of breathing. Neurologic: No focal deficits.   Lab Results   Component Value Date   CREATININE 0.76 10/20/2020   BUN 13 10/20/2020   NA 137 10/20/2020   K 4.1 10/20/2020   CL 100 10/20/2020   CO2 30 10/20/2020   Lab Results  Component Value Date   ALT 20 10/20/2020   AST 17 10/20/2020   ALKPHOS 82 10/20/2020   BILITOT 0.5 10/20/2020   Lab Results  Component Value Date   HGBA1C 5.4 10/20/2020   HGBA1C 5.0 09/15/2017   HGBA1C 5.3 03/29/2016   HGBA1C 5.4 03/29/2015   HGBA1C 5.2 03/08/2014   Lab Results  Component Value Date   INSULIN 39.5 (H) 02/14/2021   Lab Results  Component Value Date   TSH 2.09 10/20/2020   Lab Results  Component Value Date   CHOL 210 (H) 10/20/2020   HDL 47.20 10/20/2020   LDLCALC 127 (H) 10/20/2020   TRIG 176.0 (H) 10/20/2020   CHOLHDL 4 10/20/2020   Lab Results  Component Value Date   WBC 5.6 10/20/2020   HGB 13.8 10/20/2020   HCT 42.0 10/20/2020   MCV 91.7 10/20/2020   PLT 250.0 10/20/2020     Attestation Statements:   Reviewed by clinician on day of visit: allergies, medications, problem list, medical history, surgical history, family history, social history, and previous encounter notes.  Coral Ceo, CMA, am acting as Location manager for [employer], DO.  I have reviewed the above documentation for accuracy and completeness, and I agree with the above. -Jearld Lesch, DO

## 2021-02-27 ENCOUNTER — Encounter (INDEPENDENT_AMBULATORY_CARE_PROVIDER_SITE_OTHER): Payer: Self-pay | Admitting: Bariatrics

## 2021-02-28 ENCOUNTER — Other Ambulatory Visit: Payer: Self-pay

## 2021-02-28 ENCOUNTER — Encounter (INDEPENDENT_AMBULATORY_CARE_PROVIDER_SITE_OTHER): Payer: Self-pay | Admitting: Bariatrics

## 2021-02-28 ENCOUNTER — Ambulatory Visit (INDEPENDENT_AMBULATORY_CARE_PROVIDER_SITE_OTHER): Payer: BC Managed Care – PPO | Admitting: Bariatrics

## 2021-02-28 VITALS — BP 143/84 | HR 75 | Temp 97.9°F | Ht 62.0 in | Wt 237.0 lb

## 2021-02-28 DIAGNOSIS — I10 Essential (primary) hypertension: Secondary | ICD-10-CM

## 2021-02-28 DIAGNOSIS — E8881 Metabolic syndrome: Secondary | ICD-10-CM | POA: Diagnosis not present

## 2021-02-28 DIAGNOSIS — E7849 Other hyperlipidemia: Secondary | ICD-10-CM | POA: Diagnosis not present

## 2021-02-28 DIAGNOSIS — Z6841 Body Mass Index (BMI) 40.0 and over, adult: Secondary | ICD-10-CM

## 2021-03-01 NOTE — Progress Notes (Signed)
Chief Complaint:   OBESITY Sharon Allison is here to discuss her progress with her obesity treatment plan along with follow-up of her obesity related diagnoses. Sharon Allison is on the Category 3 Plan and states she is following her eating plan approximately 50% of the time. Sharon Allison states she is not currently exercising.  Today's visit was #: 2 Starting weight: 239 lbs Starting date: 02/14/2021 Today's weight: 237 lbs Today's date: 02/28/2021 Total lbs lost to date: 2 Total lbs lost since last in-office visit: 2  Interim History: Sharon Allison is down 2 lbs since her first visit. She states that she has been very busy. She has cut back on snacks.  Subjective:   1. Other hyperlipidemia Sharon Allison is taking Lipitor. Her cholesterol is not at goal.   2. Essential hypertension Sharon Allison's BP is reasonably well controlled.  BP Readings from Last 3 Encounters:  02/28/21 (!) 143/84  02/14/21 129/79  01/31/21 134/80   3. Insulin resistance Sharon Allison's A1c was 5.4 on 10/20/2020.  Lab Results  Component Value Date   INSULIN 39.5 (H) 02/14/2021   Lab Results  Component Value Date   HGBA1C 5.4 10/20/2020   Assessment/Plan:   1. Other hyperlipidemia Cardiovascular risk and specific lipid/LDL goals reviewed.  We discussed several lifestyle modifications today and Sharon Allison will continue to work on diet, exercise and weight loss efforts. Orders and follow up as documented in patient record. Continue medication. Check LFT's. Go back on Lipitor if liver enzymes are normal.  Counseling Intensive lifestyle modifications are the first line treatment for this issue. Dietary changes: Increase soluble fiber. Decrease simple carbohydrates. Exercise changes: Moderate to vigorous-intensity aerobic activity 150 minutes per week if tolerated. Lipid-lowering medications: see documented in medical record.  2. Essential hypertension Sharon Allison is working on healthy weight loss and exercise to improve blood pressure control. We will watch  for signs of hypotension as she continues her lifestyle modifications. Continue current treatment plan.  3. Insulin resistance Sharon Allison will continue to work on weight loss, exercise, and decreasing simple carbohydrates to help decrease the risk of diabetes. Sharon Allison agreed to follow-up with [place] as directed to closely monitor her progress. Decrease carbs. Increase healthy fats and protein. Handout: Insulin Resistance/Prediabetes.  4. Obesity with current BMI of 43.4  Sharon Allison is currently in the action stage of change. As such, her goal is to continue with weight loss efforts. She has agreed to the Category 3 Plan.   Meal plan Intentional eating Last labs 10/20/2020- bring in copy of recent fasting labs. Handout: Protein Equivalents  Exercise goals:  As is  Behavioral modification strategies: increasing lean protein intake, decreasing simple carbohydrates, increasing vegetables, increasing water intake, decreasing eating out, no skipping meals, meal planning and cooking strategies, keeping healthy foods in the home, and planning for success.  Sharon Allison has agreed to follow-up with our clinic in 2 weeks. She was informed of the importance of frequent follow-up visits to maximize her success with intensive lifestyle modifications for her multiple health conditions.   Objective:   Blood pressure (!) 143/84, pulse 75, temperature 97.9 F (36.6 C), height 5\' 2"  (1.575 m), weight 237 lb (107.5 kg), last menstrual period 07/10/2001, SpO2 96 %. Body mass index is 43.35 kg/m.  General: Cooperative, alert, well developed, in no acute distress. HEENT: Conjunctivae and lids unremarkable. Cardiovascular: Regular rhythm.  Lungs: Normal work of breathing. Neurologic: No focal deficits.   Lab Results  Component Value Date   CREATININE 0.76 10/20/2020   BUN 13 10/20/2020   NA  137 10/20/2020   K 4.1 10/20/2020   CL 100 10/20/2020   CO2 30 10/20/2020   Lab Results  Component Value Date   ALT 20 10/20/2020    AST 17 10/20/2020   ALKPHOS 82 10/20/2020   BILITOT 0.5 10/20/2020   Lab Results  Component Value Date   HGBA1C 5.4 10/20/2020   HGBA1C 5.0 09/15/2017   HGBA1C 5.3 03/29/2016   HGBA1C 5.4 03/29/2015   HGBA1C 5.2 03/08/2014   Lab Results  Component Value Date   INSULIN 39.5 (H) 02/14/2021   Lab Results  Component Value Date   TSH 2.09 10/20/2020   Lab Results  Component Value Date   CHOL 210 (H) 10/20/2020   HDL 47.20 10/20/2020   LDLCALC 127 (H) 10/20/2020   TRIG 176.0 (H) 10/20/2020   CHOLHDL 4 10/20/2020   Lab Results  Component Value Date   WBC 5.6 10/20/2020   HGB 13.8 10/20/2020   HCT 42.0 10/20/2020   MCV 91.7 10/20/2020   PLT 250.0 10/20/2020   No results found for: IRON, TIBC, FERRITIN  Attestation Statements:   Reviewed by clinician on day of visit: allergies, medications, problem list, medical history, surgical history, family history, social history, and previous encounter notes.  Coral Ceo, CMA, am acting as Location manager for [employer], DO.  I have reviewed the above documentation for accuracy and completeness, and I agree with the above. Jearld Lesch, DO

## 2021-03-05 ENCOUNTER — Other Ambulatory Visit: Payer: Self-pay

## 2021-03-05 ENCOUNTER — Encounter (INDEPENDENT_AMBULATORY_CARE_PROVIDER_SITE_OTHER): Payer: Self-pay | Admitting: Bariatrics

## 2021-03-05 ENCOUNTER — Inpatient Hospital Stay: Payer: BC Managed Care – PPO | Attending: Oncology

## 2021-03-05 VITALS — BP 139/72 | HR 72 | Temp 98.2°F | Resp 18

## 2021-03-05 DIAGNOSIS — C182 Malignant neoplasm of ascending colon: Secondary | ICD-10-CM

## 2021-03-05 DIAGNOSIS — Z452 Encounter for adjustment and management of vascular access device: Secondary | ICD-10-CM | POA: Diagnosis not present

## 2021-03-05 DIAGNOSIS — Z95828 Presence of other vascular implants and grafts: Secondary | ICD-10-CM

## 2021-03-05 MED ORDER — HEPARIN SOD (PORK) LOCK FLUSH 100 UNIT/ML IV SOLN
500.0000 [IU] | Freq: Once | INTRAVENOUS | Status: AC
  Administered 2021-03-05: 500 [IU]
  Filled 2021-03-05: qty 5

## 2021-03-05 MED ORDER — SODIUM CHLORIDE 0.9% FLUSH
10.0000 mL | Freq: Once | INTRAVENOUS | Status: AC
  Administered 2021-03-05: 10 mL
  Filled 2021-03-05: qty 10

## 2021-03-05 NOTE — Patient Instructions (Signed)
[facility]  Discharge Instructions: Thank you for choosing [facility] to provide your oncology and hematology care.   If you have a lab appointment with the Cancer Center, please go directly to the Cancer Center and check in at the registration area.   Wear comfortable clothing and clothing appropriate for easy access to any Portacath or PICC line.   We strive to give you quality time with your provider. You may need to reschedule your appointment if you arrive late (15 or more minutes).  Arriving late affects you and other patients whose appointments are after yours.  Also, if you miss three or more appointments without notifying the office, you may be dismissed from the clinic at the provider's discretion.      For prescription refill requests, have your pharmacy contact our office and allow 72 hours for refills to be completed.    Today you received the following chemotherapy and/or immunotherapy agents PAC flush      To help prevent nausea and vomiting after your treatment, we encourage you to take your nausea medication as directed.  BELOW ARE SYMPTOMS THAT SHOULD BE REPORTED IMMEDIATELY: *FEVER GREATER THAN 100.4 F (38 C) OR HIGHER *CHILLS OR SWEATING *NAUSEA AND VOMITING THAT IS NOT CONTROLLED WITH YOUR NAUSEA MEDICATION *UNUSUAL SHORTNESS OF BREATH *UNUSUAL BRUISING OR BLEEDING *URINARY PROBLEMS (pain or burning when urinating, or frequent urination) *BOWEL PROBLEMS (unusual diarrhea, constipation, pain near the anus) TENDERNESS IN MOUTH AND THROAT WITH OR WITHOUT PRESENCE OF ULCERS (sore throat, sores in mouth, or a toothache) UNUSUAL RASH, SWELLING OR PAIN  UNUSUAL VAGINAL DISCHARGE OR ITCHING   Items with * indicate a potential emergency and should be followed up as soon as possible or go to the Emergency Department if any problems should occur.  Please show the CHEMOTHERAPY ALERT CARD or IMMUNOTHERAPY ALERT CARD at check-in to the  Emergency Department and triage nurse.  Should you have questions after your visit or need to cancel or reschedule your appointment, please contact [facility]  Dept: [phone]  and follow the prompts.  Office hours are 8:00 a.m. to 4:30 p.m. Monday - Friday. Please note that voicemails left after 4:00 p.m. may not be returned until the following business day.  We are closed weekends and major holidays. You have access to a nurse at all times for urgent questions. Please call the main number to the clinic Dept: [phone] and follow the prompts.   For any non-urgent questions, you may also contact your provider using MyChart. We now offer e-Visits for anyone 18 and older to request care online for non-urgent symptoms. For details visit [email or web address].   Also download the MyChart app! Go to the app store, search "MyChart", open the app, select [facility], and log in with your MyChart username and password.  Due to Covid, a mask is required upon entering the hospital/clinic. If you do not have a mask, one will be given to you upon arrival. For doctor visits, patients may have 1 support person aged 18 or older with them. For treatment visits, patients cannot have anyone with them due to current Covid guidelines and our immunocompromised population.   Implanted Port Home Guide An implanted port is a device that is placed under the skin. It is usually placed in the chest. The device can be used to give IV medicine, to take blood, or for dialysis. You may have an implanted port if: You need IV   medicine that would be irritating to the small veins in your hands or arms. You need IV medicines, such as antibiotics, for a long period of time. You need IV nutrition for a long period of time. You need dialysis. When you have a port, your health care provider can choose to use the port instead of veins in your arms for these procedures. You may have fewer limitations when  using a port than you would if you used other types of long-term IVs, and you will likely be able to return to normal activities afteryour incision heals. An implanted port has two main parts: Reservoir. The reservoir is the part where a needle is inserted to give medicines or draw blood. The reservoir is round. After it is placed, it appears as a small, raised area under your skin. Catheter. The catheter is a thin, flexible tube that connects the reservoir to a vein. Medicine that is inserted into the reservoir goes into the catheter and then into the vein. How is my port accessed? To access your port: A numbing cream may be placed on the skin over the port site. Your health care provider will put on a mask and sterile gloves. The skin over your port will be cleaned carefully with a germ-killing soap and allowed to dry. Your health care provider will gently pinch the port and insert a needle into it. Your health care provider will check for a blood return to make sure the port is in the vein and is not clogged. If your port needs to remain accessed to get medicine continuously (constant infusion), your health care provider will place a clear bandage (dressing) over the needle site. The dressing and needle will need to be changed every week, or as told by your health care provider. What is flushing? Flushing helps keep the port from getting clogged. Follow instructions from your health care provider about how and when to flush the port. Ports are usually flushed with saline solution or a medicine called heparin. The need for flushing will depend on how the port is used: If the port is only used from time to time to give medicines or draw blood, the port may need to be flushed: Before and after medicines have been given. Before and after blood has been drawn. As part of routine maintenance. Flushing may be recommended every 4-6 weeks. If a constant infusion is running, the port may not need to be  flushed. Throw away any syringes in a disposal container that is meant for sharp items (sharps container). You can buy a sharps container from a pharmacy, or you can make one by using an empty hard plastic bottle with a cover. How long will my port stay implanted? The port can stay in for as long as your health care provider thinks it is needed. When it is time for the port to come out, a surgery will be done to remove it. The surgery will be similar to the procedure that was done to putthe port in. Follow these instructions at home:  Flush your port as told by your health care provider. If you need an infusion over several days, follow instructions from your health care provider about how to take care of your port site. Make sure you: Wash your hands with soap and water before you change your dressing. If soap and water are not available, use alcohol-based hand sanitizer. Change your dressing as told by your health care provider. Place any used dressings or infusion   bags into a plastic bag. Throw that bag in the trash. Keep the dressing that covers the needle clean and dry. Do not get it wet. Do not use scissors or sharp objects near the tube. Keep the tube clamped, unless it is being used. Check your port site every day for signs of infection. Check for: Redness, swelling, or pain. Fluid or blood. Pus or a bad smell. Protect the skin around the port site. Avoid wearing bra straps that rub or irritate the site. Protect the skin around your port from seat belts. Place a soft pad over your chest if needed. Bathe or shower as told by your health care provider. The site may get wet as long as you are not actively receiving an infusion. Return to your normal activities as told by your health care provider. Ask your health care provider what activities are safe for you. Carry a medical alert card or wear a medical alert bracelet at all times. This will let health care providers know that you have  an implanted port in case of an emergency. Get help right away if: You have redness, swelling, or pain at the port site. You have fluid or blood coming from your port site. You have pus or a bad smell coming from the port site. You have a fever. Summary Implanted ports are usually placed in the chest for long-term IV access. Follow instructions from your health care provider about flushing the port and changing bandages (dressings). Take care of the area around your port by avoiding clothing that puts pressure on the area, and by watching for signs of infection. Protect the skin around your port from seat belts. Place a soft pad over your chest if needed. Get help right away if you have a fever or you have redness, swelling, pain, drainage, or a bad smell at the port site. This information is not intended to replace advice given to you by your health care provider. Make sure you discuss any questions you have with your healthcare provider. Document Revised: 01/10/2020 Document Reviewed: 01/10/2020 Elsevier Patient Education  2022 [employer]   

## 2021-03-19 ENCOUNTER — Ambulatory Visit (INDEPENDENT_AMBULATORY_CARE_PROVIDER_SITE_OTHER): Payer: BC Managed Care – PPO | Admitting: Bariatrics

## 2021-03-19 ENCOUNTER — Other Ambulatory Visit: Payer: Self-pay

## 2021-03-19 ENCOUNTER — Encounter (INDEPENDENT_AMBULATORY_CARE_PROVIDER_SITE_OTHER): Payer: Self-pay | Admitting: Bariatrics

## 2021-03-19 VITALS — BP 137/91 | HR 57 | Temp 97.6°F | Ht 62.0 in | Wt 235.0 lb

## 2021-03-19 DIAGNOSIS — E66813 Obesity, class 3: Secondary | ICD-10-CM

## 2021-03-19 DIAGNOSIS — E8881 Metabolic syndrome: Secondary | ICD-10-CM

## 2021-03-19 DIAGNOSIS — Z9189 Other specified personal risk factors, not elsewhere classified: Secondary | ICD-10-CM | POA: Diagnosis not present

## 2021-03-19 DIAGNOSIS — E7849 Other hyperlipidemia: Secondary | ICD-10-CM

## 2021-03-19 DIAGNOSIS — I10 Essential (primary) hypertension: Secondary | ICD-10-CM | POA: Diagnosis not present

## 2021-03-19 DIAGNOSIS — E7800 Pure hypercholesterolemia, unspecified: Secondary | ICD-10-CM

## 2021-03-19 DIAGNOSIS — E88819 Insulin resistance, unspecified: Secondary | ICD-10-CM

## 2021-03-19 DIAGNOSIS — E559 Vitamin D deficiency, unspecified: Secondary | ICD-10-CM | POA: Diagnosis not present

## 2021-03-19 DIAGNOSIS — Z6841 Body Mass Index (BMI) 40.0 and over, adult: Secondary | ICD-10-CM

## 2021-03-19 MED ORDER — ATORVASTATIN CALCIUM 10 MG PO TABS: 10.0000 mg | ORAL_TABLET | Freq: Every day | ORAL | 0 refills | Status: DC

## 2021-03-20 ENCOUNTER — Encounter (INDEPENDENT_AMBULATORY_CARE_PROVIDER_SITE_OTHER): Payer: Self-pay | Admitting: Bariatrics

## 2021-03-20 DIAGNOSIS — E559 Vitamin D deficiency, unspecified: Secondary | ICD-10-CM | POA: Insufficient documentation

## 2021-03-20 LAB — COMPREHENSIVE METABOLIC PANEL
ALT: 18 IU/L (ref 0–32)
AST: 16 IU/L (ref 0–40)
Albumin/Globulin Ratio: 1.8 (ref 1.2–2.2)
Albumin: 4.3 g/dL (ref 3.8–4.8)
Alkaline Phosphatase: 99 IU/L (ref 44–121)
BUN/Creatinine Ratio: 15 (ref 12–28)
BUN: 11 mg/dL (ref 8–27)
Bilirubin Total: 0.5 mg/dL (ref 0.0–1.2)
CO2: 23 mmol/L (ref 20–29)
Calcium: 10.2 mg/dL (ref 8.7–10.3)
Chloride: 99 mmol/L (ref 96–106)
Creatinine, Ser: 0.71 mg/dL (ref 0.57–1.00)
Globulin, Total: 2.4 g/dL (ref 1.5–4.5)
Glucose: 94 mg/dL (ref 65–99)
Potassium: 4.4 mmol/L (ref 3.5–5.2)
Sodium: 139 mmol/L (ref 134–144)
Total Protein: 6.7 g/dL (ref 6.0–8.5)
eGFR: 95 mL/min/{1.73_m2} (ref >59)

## 2021-03-20 LAB — LIPID PANEL WITH LDL/HDL RATIO
Cholesterol, Total: 190 mg/dL (ref 100–199)
HDL: 44 mg/dL (ref >39)
LDL Chol Calc (NIH): 123 mg/dL — ABNORMAL HIGH (ref 0–99)
LDL/HDL Ratio: 2.8 ratio (ref 0.0–3.2)
Triglycerides: 130 mg/dL (ref 0–149)
VLDL Cholesterol Cal: 23 mg/dL (ref 5–40)

## 2021-03-20 LAB — VITAMIN D 25 HYDROXY (VIT D DEFICIENCY, FRACTURES): Vit D, 25-Hydroxy: 26.3 ng/mL — ABNORMAL LOW (ref 30.0–100.0)

## 2021-03-20 LAB — HEMOGLOBIN A1C
Est. average glucose Bld gHb Est-mCnc: 108 mg/dL
Hgb A1c MFr Bld: 5.4 % (ref 4.8–5.6)

## 2021-03-20 LAB — INSULIN, RANDOM: INSULIN: 31.9 u[IU]/mL — ABNORMAL HIGH (ref 2.6–24.9)

## 2021-03-21 NOTE — Progress Notes (Signed)
Chief Complaint:   OBESITY Sharon Allison is here to discuss her progress with her obesity treatment plan along with follow-up of her obesity related diagnoses. Sharon Allison is on the Category 3 Plan and states she is following her eating plan approximately 70% of the time. Sharon Allison states she is doing yard work 60 minutes 3 times per week.  Today's visit was #: 3 Starting weight: 239 lbs Starting date: 02/14/2021 Today's weight: 235 lbs Today's date: 03/19/2021 Total lbs lost to date: 4 lbs Total lbs lost since last in-office visit: 2 lbs  Interim History: Sharon Allison is down 2 lbs since her last visit.  Subjective:   1. Essential hypertension Sharon Allison is not on any medications.  2. Insulin resistance Sharon Allison is not on any medications.  3. Other hyperlipidemia Sharon Allison's calcium range is 10.7  4. Elevated cholesterol Sharon Allison is not on any medications, but she has been on Atorvastatin.  5. Vitamin D deficiency Sharon Allison is taking OTC Vitamin D.  6. At risk for heart disease Sharon Allison is at a higher than average risk for cardiovascular disease due to obesity.    Assessment/Plan:   1. Essential hypertension Sharon Allison is working on healthy weight loss and exercise to improve blood pressure control. Sharon Allison will continue with no salt added.  We will check labs today. We will watch for signs of hypotension as she continues her lifestyle modifications.  - Comprehensive metabolic panel  2. Insulin resistance Sharon Allison will continue to work on weight loss, exercise, and decreasing simple carbohydrates to help decrease the risk of diabetes. Sharon Allison will increase healthy proteins and fats.We will check labs today. Sharon Allison agreed to follow-up with [place] as directed to closely monitor her progress.   - Insulin, random - Hemoglobin A1c  3. Other hyperlipidemia Cardiovascular risk and specific lipid/LDL goals reviewed.  We will check labs today.We discussed several lifestyle modifications today and Sharon Allison will continue to work on diet,  exercise and weight loss efforts. Orders and follow up as documented in patient record.   Counseling Intensive lifestyle modifications are the first line treatment for this issue. Dietary changes: Increase soluble fiber. Decrease simple carbohydrates. Exercise changes: Moderate to vigorous-intensity aerobic activity 150 minutes per week if tolerated. Lipid-lowering medications: see documented in medical record.  - Lipid Panel With LDL/HDL Ratio  4. Elevated cholesterol Sharon Allison is working on healthy weight loss and exercise to improve blood pressure control. Sharon Allison will continue PUFAs, MUFAs, and Lipitor. We will check labs.We will watch for signs of hypotension as she continues her lifestyle modifications.   - atorvastatin (LIPITOR) 10 MG tablet; Take 1 tablet (10 mg total) by mouth daily.  Dispense: 90 tablet; Refill: 0  5. Vitamin D deficiency Low Vitamin D level contributes to fatigue and are associated with obesity, breast, and colon cancer. We will check labs today.She agrees to continue to take OTC Vitamin D @1 ,000 IU daily and will follow-up for routine testing of Vitamin D, at least 2-3 times per year to avoid over-replacement.  - VITAMIN D 25 Hydroxy (Vit-D Deficiency, Fractures)  6. At risk for heart disease Sharon Allison is at risk for heart disease for obesity, hypertension, and elevated cholesterol.  7. Obesity with current BMI of 43.1 Sharon Allison is currently in the action stage of change. As such, her goal is to continue with weight loss efforts. She has agreed to the Category 3 Plan.   Sharon Allison will continue meal planning and intentional eating.  Exercise goals: No exercise has been prescribed at this time.  Behavioral  modification strategies: increasing lean protein intake, decreasing simple carbohydrates, increasing vegetables, increasing water intake, decreasing eating out, no skipping meals, meal planning and cooking strategies, keeping healthy foods in the home, and planning for  success.  Sharon Allison has agreed to follow-up with our clinic in 2 weeks. She was informed of the importance of frequent follow-up visits to maximize her success with intensive lifestyle modifications for her multiple health conditions.  Sharon Allison was informed we would discuss her lab results at her next visit unless there is a critical issue that needs to be addressed sooner. Sharon Allison agreed to keep her next visit at the agreed upon time to discuss these results.  Objective:   Blood pressure (!) 137/91, pulse (!) 57, temperature 97.6 F (36.4 C), height 5\' 2"  (1.575 m), weight 235 lb (106.6 kg), last menstrual period 07/10/2001, SpO2 100 %. Body mass index is 42.98 kg/m.  General: Cooperative, alert, well developed, in no acute distress. HEENT: Conjunctivae and lids unremarkable. Cardiovascular: Regular rhythm.  Lungs: Normal work of breathing. Neurologic: No focal deficits.   Lab Results  Component Value Date   CREATININE 0.71 03/19/2021   BUN 11 03/19/2021   NA 139 03/19/2021   K 4.4 03/19/2021   CL 99 03/19/2021   CO2 23 03/19/2021   Lab Results  Component Value Date   ALT 18 03/19/2021   AST 16 03/19/2021   ALKPHOS 99 03/19/2021   BILITOT 0.5 03/19/2021   Lab Results  Component Value Date   HGBA1C 5.4 03/19/2021   HGBA1C 5.4 10/20/2020   HGBA1C 5.0 09/15/2017   HGBA1C 5.3 03/29/2016   HGBA1C 5.4 03/29/2015   Lab Results  Component Value Date   INSULIN 31.9 (H) 03/19/2021   INSULIN 39.5 (H) 02/14/2021   Lab Results  Component Value Date   TSH 2.09 10/20/2020   Lab Results  Component Value Date   CHOL 190 03/19/2021   HDL 44 03/19/2021   LDLCALC 123 (H) 03/19/2021   TRIG 130 03/19/2021   CHOLHDL 4 10/20/2020   Lab Results  Component Value Date   VD25OH 26.3 (L) 03/19/2021   VD25OH 34.8 08/13/2017   VD25OH 29.7 (L) 06/09/2017   Lab Results  Component Value Date   WBC 5.6 10/20/2020   HGB 13.8 10/20/2020   HCT 42.0 10/20/2020   MCV 91.7 10/20/2020   PLT  250.0 10/20/2020   No results found for: IRON, TIBC, FERRITIN  Attestation Statements:   Reviewed by clinician on day of visit: allergies, medications, problem list, medical history, surgical history, family history, social history, and previous encounter notes.  I, Lizbeth Bark, RMA, am acting as Location manager for [employer], DO.  I have reviewed the above documentation for accuracy and completeness, and I agree with the above. Jearld Lesch, DO

## 2021-03-26 ENCOUNTER — Encounter (INDEPENDENT_AMBULATORY_CARE_PROVIDER_SITE_OTHER): Payer: Self-pay | Admitting: Bariatrics

## 2021-04-03 ENCOUNTER — Ambulatory Visit (INDEPENDENT_AMBULATORY_CARE_PROVIDER_SITE_OTHER): Payer: BC Managed Care – PPO | Admitting: Bariatrics

## 2021-04-09 ENCOUNTER — Encounter (INDEPENDENT_AMBULATORY_CARE_PROVIDER_SITE_OTHER): Payer: Self-pay | Admitting: Bariatrics

## 2021-04-09 ENCOUNTER — Other Ambulatory Visit: Payer: Self-pay

## 2021-04-09 ENCOUNTER — Ambulatory Visit (INDEPENDENT_AMBULATORY_CARE_PROVIDER_SITE_OTHER): Payer: BC Managed Care – PPO | Admitting: Bariatrics

## 2021-04-09 VITALS — BP 130/78 | HR 76 | Temp 98.2°F | Ht 62.0 in | Wt 237.0 lb

## 2021-04-09 DIAGNOSIS — E8881 Metabolic syndrome: Secondary | ICD-10-CM

## 2021-04-09 DIAGNOSIS — E7849 Other hyperlipidemia: Secondary | ICD-10-CM | POA: Diagnosis not present

## 2021-04-09 DIAGNOSIS — Z6841 Body Mass Index (BMI) 40.0 and over, adult: Secondary | ICD-10-CM

## 2021-04-10 NOTE — Progress Notes (Signed)
Chief Complaint:   OBESITY Sharon Allison is here to discuss her progress with her obesity treatment plan along with follow-up of her obesity related diagnoses. Sharon Allison is on the Category 3 Plan and states she is following her eating plan approximately 50% of the time. Sharon Allison states she is doing 0 minutes 0 times per week.  Today's visit was #: 4 Starting weight: 239 lbs Starting date: 02/14/2021 Today's weight: 237 lbs Today's date:04/09/2021 Total lbs lost to date: 2 lbs Total lbs lost since last in-office visit: 0 lbs  Interim History: Sharon Allison is up 2 lbs since her last visit. She had occasional events. She is up 2 lbs of water per the bioimpedance.   Subjective:   1. Other hyperlipidemia Sharon Allison is taking Lipitor.  2. Insulin resistance Sharon Allison is not on medication for Insulin resistance.  Assessment/Plan:   1. Other hyperlipidemia Sharon Allison will continue taking Lipitor. Cardiovascular risk and specific lipid/LDL goals reviewed.  We discussed several lifestyle modifications today and Sharon Allison will continue to work on diet, exercise and weight loss efforts. Orders and follow up as documented in patient record.   Counseling Intensive lifestyle modifications are the first line treatment for this issue. Dietary changes: Increase soluble fiber. Decrease simple carbohydrates. Exercise changes: Moderate to vigorous-intensity aerobic activity 150 minutes per week if tolerated. Lipid-lowering medications: see documented in medical record.   2. Insulin resistance Sharon Allison will continue to work on weight loss, exercise, and decreasing simple carbohydrates to help decrease the risk of diabetes. She will increase healthy fats and protein. Sharon Allison agreed to follow-up with [place] as directed to closely monitor her progress.   3. Obesity with current BMI of 43.5 Sharon Allison is currently in the action stage of change. As such, her goal is to continue with weight loss efforts. She has agreed to the Category 3 Plan.   Sharon Allison  will continue meal planning. She will mindful eating. She will get back on track. She will increase protein. She will get on plan 85%.  Exercise goals:  Sharon Allison will increase walking.  Behavioral modification strategies: increasing lean protein intake, decreasing simple carbohydrates, increasing vegetables, increasing water intake, decreasing eating out, no skipping meals, meal planning and cooking strategies, keeping healthy foods in the home, and planning for success.  Sharon Allison has agreed to follow-up with our clinic in 2 weeks. She was informed of the importance of frequent follow-up visits to maximize her success with intensive lifestyle modifications for her multiple health conditions.   Objective:   Blood pressure 130/78, pulse 76, temperature 98.2 F (36.8 C), height '5\' 2"'$  (1.575 m), weight 237 lb (107.5 kg), last menstrual period 07/10/2001, SpO2 95 %. Body mass index is 43.35 kg/m.  General: Cooperative, alert, well developed, in no acute distress. HEENT: Conjunctivae and lids unremarkable. Cardiovascular: Regular rhythm.  Lungs: Normal work of breathing. Neurologic: No focal deficits.   Lab Results  Component Value Date   CREATININE 0.71 03/19/2021   BUN 11 03/19/2021   NA 139 03/19/2021   K 4.4 03/19/2021   CL 99 03/19/2021   CO2 23 03/19/2021   Lab Results  Component Value Date   ALT 18 03/19/2021   AST 16 03/19/2021   ALKPHOS 99 03/19/2021   BILITOT 0.5 03/19/2021   Lab Results  Component Value Date   HGBA1C 5.4 03/19/2021   HGBA1C 5.4 10/20/2020   HGBA1C 5.0 09/15/2017   HGBA1C 5.3 03/29/2016   HGBA1C 5.4 03/29/2015   Lab Results  Component Value Date   INSULIN  31.9 (H) 03/19/2021   INSULIN 39.5 (H) 02/14/2021   Lab Results  Component Value Date   TSH 2.09 10/20/2020   Lab Results  Component Value Date   CHOL 190 03/19/2021   HDL 44 03/19/2021   LDLCALC 123 (H) 03/19/2021   TRIG 130 03/19/2021   CHOLHDL 4 10/20/2020   Lab Results  Component  Value Date   VD25OH 26.3 (L) 03/19/2021   VD25OH 34.8 08/13/2017   VD25OH 29.7 (L) 06/09/2017   Lab Results  Component Value Date   WBC 5.6 10/20/2020   HGB 13.8 10/20/2020   HCT 42.0 10/20/2020   MCV 91.7 10/20/2020   PLT 250.0 10/20/2020   No results found for: IRON, TIBC, FERRITIN  Attestation Statements:   Reviewed by clinician on day of visit: allergies, medications, problem list, medical history, surgical history, family history, social history, and previous encounter notes.  Time spent on visit including pre-visit chart review and post-visit care and charting was 20 minutes.   I, Lizbeth Bark, RMA, am acting as Location manager for [employer], DO.   I have reviewed the above documentation for accuracy and completeness, and I agree with the above. Jearld Lesch, DO

## 2021-04-11 ENCOUNTER — Encounter (INDEPENDENT_AMBULATORY_CARE_PROVIDER_SITE_OTHER): Payer: Self-pay | Admitting: Bariatrics

## 2021-04-24 ENCOUNTER — Ambulatory Visit (INDEPENDENT_AMBULATORY_CARE_PROVIDER_SITE_OTHER): Payer: BC Managed Care – PPO | Admitting: Bariatrics

## 2021-04-30 ENCOUNTER — Telehealth: Payer: Self-pay | Admitting: *Deleted

## 2021-04-30 NOTE — Telephone Encounter (Signed)
Left VM w/patient to inquire if surgeon has contacted her yet for port removal?

## 2021-05-03 ENCOUNTER — Telehealth: Payer: Self-pay | Admitting: *Deleted

## 2021-05-03 NOTE — Telephone Encounter (Signed)
Patient called to report that the surgeon office has reached out to her about port removal. She is asking she could have the port removed and colonoscopy on the same day since she requires general anesthesia for colonoscopy. Instructed her that she needs to discuss that with both physician.

## 2021-05-07 ENCOUNTER — Ambulatory Visit (INDEPENDENT_AMBULATORY_CARE_PROVIDER_SITE_OTHER): Payer: BC Managed Care – PPO | Admitting: Bariatrics

## 2021-05-07 ENCOUNTER — Encounter (INDEPENDENT_AMBULATORY_CARE_PROVIDER_SITE_OTHER): Payer: Self-pay | Admitting: Bariatrics

## 2021-05-07 ENCOUNTER — Other Ambulatory Visit: Payer: Self-pay

## 2021-05-07 VITALS — BP 145/84 | HR 73 | Temp 98.2°F | Ht 62.0 in | Wt 234.0 lb

## 2021-05-07 DIAGNOSIS — Z6841 Body Mass Index (BMI) 40.0 and over, adult: Secondary | ICD-10-CM

## 2021-05-07 DIAGNOSIS — F509 Eating disorder, unspecified: Secondary | ICD-10-CM | POA: Diagnosis not present

## 2021-05-07 DIAGNOSIS — Z9189 Other specified personal risk factors, not elsewhere classified: Secondary | ICD-10-CM | POA: Diagnosis not present

## 2021-05-07 DIAGNOSIS — I10 Essential (primary) hypertension: Secondary | ICD-10-CM

## 2021-05-07 DIAGNOSIS — E8881 Metabolic syndrome: Secondary | ICD-10-CM | POA: Diagnosis not present

## 2021-05-07 MED ORDER — BUPROPION HCL ER (SR) 150 MG PO TB12: 150.0000 mg | ORAL_TABLET | Freq: Every day | ORAL | 0 refills | Status: DC

## 2021-05-08 NOTE — Progress Notes (Signed)
Chief Complaint:   OBESITY Sharon Allison is here to discuss her progress with her obesity treatment plan along with follow-up of her obesity related diagnoses. Sharon Allison is on the Category 3 Plan and states she is following her eating plan approximately 50% of the time. Sharon Allison states she is doing 0 minutes 0 times per week.  Today's visit was #: 5 Starting weight: 239 lbs Starting date: 02/14/2021 Today's weight: 234 lbs Today's date:05/07/2021 Total lbs lost to date: 5 lbs Total lbs lost since last in-office visit: 3 lbs  Interim History: Sharon Allison is down 3 lbs since her last visit. She is getting discouraged with her weight loss. She has a good appetite.   Subjective:   1. Insulin resistance Sharon Allison is currently not on medications.  2. Essential hypertension Sharon Allison's hypertension is well controlled. She is currently not on medications.  3. Eating disorder, unspecified type Sharon Allison is currently not on medications.  4. At risk for side effect of medication Sharon Allison is at risk for side effect of medication due to starting Wellbutrin.  Assessment/Plan:   1. Insulin resistance Sharon Allison will continue to work on weight loss, exercise, and decreasing simple carbohydrates to help decrease the risk of diabetes. She will increase healthy fats and protein. Sharon Allison agreed to follow-up with [place] as directed to closely monitor her progress.  2. Essential hypertension Sharon Allison will continue with no salt added. She will continue to work on plan and exercise. She is working on healthy weight loss and exercise to improve blood pressure control. We will watch for signs of hypotension as she continues her lifestyle modifications.  3. Eating disorder, unspecified type Behavior modification techniques were discussed today to help Sharon Allison deal with her emotional/non-hunger eating behaviors.  We will refill Wellbutrin  150 mg by mouth daily for 1 month with no refills. Orders and follow up as documented in patient record.   -  buPROPion (WELLBUTRIN SR) 150 MG 12 hr tablet; Take 1 tablet (150 mg total) by mouth daily.  Dispense: 30 tablet; Refill: 0  4. At risk for side effect of medication Sharon Allison was given approximately 15 minutes of drug side effect counseling today.  We discussed side effect possibility and risk versus benefits. Sharon Allison agreed to the medication and will contact this office if these side effects are intolerable.  Repetitive spaced learning was employed today to elicit superior memory formation and behavioral change.   5. Class 3 severe obesity with serious comorbidity and body mass index (BMI) of 40.0 to 44.9 in adult, unspecified obesity type (HCC) Sharon Allison is currently in the action stage of change. As such, her goal is to continue with weight loss efforts. She has agreed to the Category 3 Plan.   Sharon Allison will continue meal planning. She will follow the plan at least 80-85%.  Exercise goals:  Sharon Allison will remain walking in the fall.  Behavioral modification strategies: increasing lean protein intake, decreasing simple carbohydrates, increasing vegetables, increasing water intake, decreasing eating out, no skipping meals, meal planning and cooking strategies, keeping healthy foods in the home, and planning for success.  Sharon Allison has agreed to follow-up with our clinic in 4 weeks. She was informed of the importance of frequent follow-up visits to maximize her success with intensive lifestyle modifications for her multiple health conditions.   Objective:   Blood pressure (!) 145/84, pulse 73, temperature 98.2 F (36.8 C), height '5\' 2"'$  (1.575 m), weight 234 lb (106.1 kg), last menstrual period 07/10/2001, SpO2 97 %. Body mass index is  42.8 kg/m.  General: Cooperative, alert, well developed, in no acute distress. HEENT: Conjunctivae and lids unremarkable. Cardiovascular: Regular rhythm.  Lungs: Normal work of breathing. Neurologic: No focal deficits.   Lab Results  Component Value Date   CREATININE 0.71  03/19/2021   BUN 11 03/19/2021   NA 139 03/19/2021   K 4.4 03/19/2021   CL 99 03/19/2021   CO2 23 03/19/2021   Lab Results  Component Value Date   ALT 18 03/19/2021   AST 16 03/19/2021   ALKPHOS 99 03/19/2021   BILITOT 0.5 03/19/2021   Lab Results  Component Value Date   HGBA1C 5.4 03/19/2021   HGBA1C 5.4 10/20/2020   HGBA1C 5.0 09/15/2017   HGBA1C 5.3 03/29/2016   HGBA1C 5.4 03/29/2015   Lab Results  Component Value Date   INSULIN 31.9 (H) 03/19/2021   INSULIN 39.5 (H) 02/14/2021   Lab Results  Component Value Date   TSH 2.09 10/20/2020   Lab Results  Component Value Date   CHOL 190 03/19/2021   HDL 44 03/19/2021   LDLCALC 123 (H) 03/19/2021   TRIG 130 03/19/2021   CHOLHDL 4 10/20/2020   Lab Results  Component Value Date   VD25OH 26.3 (L) 03/19/2021   VD25OH 34.8 08/13/2017   VD25OH 29.7 (L) 06/09/2017   Lab Results  Component Value Date   WBC 5.6 10/20/2020   HGB 13.8 10/20/2020   HCT 42.0 10/20/2020   MCV 91.7 10/20/2020   PLT 250.0 10/20/2020   No results found for: IRON, TIBC, FERRITIN  Attestation Statements:   Reviewed by clinician on day of visit: allergies, medications, problem list, medical history, surgical history, family history, social history, and previous encounter notes.  I, Lizbeth Bark, RMA, am acting as Location manager for [employer], DO.   I have reviewed the above documentation for accuracy and completeness, and I agree with the above. Jearld Lesch, DO

## 2021-05-18 ENCOUNTER — Other Ambulatory Visit: Payer: Self-pay | Admitting: Gastroenterology

## 2021-06-05 ENCOUNTER — Ambulatory Visit (INDEPENDENT_AMBULATORY_CARE_PROVIDER_SITE_OTHER): Payer: BC Managed Care – PPO | Admitting: Bariatrics

## 2021-06-05 ENCOUNTER — Other Ambulatory Visit: Payer: Self-pay

## 2021-06-05 ENCOUNTER — Encounter (INDEPENDENT_AMBULATORY_CARE_PROVIDER_SITE_OTHER): Payer: Self-pay | Admitting: Bariatrics

## 2021-06-05 VITALS — BP 133/82 | HR 63 | Temp 97.8°F | Ht 62.0 in | Wt 231.0 lb

## 2021-06-05 DIAGNOSIS — Z6841 Body Mass Index (BMI) 40.0 and over, adult: Secondary | ICD-10-CM

## 2021-06-05 DIAGNOSIS — E7849 Other hyperlipidemia: Secondary | ICD-10-CM

## 2021-06-05 DIAGNOSIS — F509 Eating disorder, unspecified: Secondary | ICD-10-CM | POA: Diagnosis not present

## 2021-06-05 DIAGNOSIS — Z9189 Other specified personal risk factors, not elsewhere classified: Secondary | ICD-10-CM

## 2021-06-05 DIAGNOSIS — E66813 Obesity, class 3: Secondary | ICD-10-CM

## 2021-06-05 MED ORDER — BUPROPION HCL ER (SR) 150 MG PO TB12: 150.0000 mg | ORAL_TABLET | Freq: Every day | ORAL | 0 refills | Status: DC

## 2021-06-05 NOTE — Progress Notes (Signed)
Chief Complaint:   OBESITY Sharon Allison is here to discuss her progress with her obesity treatment plan along with follow-up of her obesity related diagnoses. Sharon Allison is on the Category 3 Plan and states she is following her eating plan approximately 50% of the time. Sharon Allison states she is doing 0 minutes 0 times per week.  Today's visit was #: 6 Starting weight: 239 lbs Starting date: 02/14/2021 Today's weight: 231 lbs Today's date: 06/05/2021 Total lbs lost to date: 8 lbs Total lbs lost since last in-office visit: 3 lbs  Interim History: Matisse is down 3 lbs since her last visit.  Subjective:   1. Other hyperlipidemia Sharon Allison is currently taking Lipitor.  2. Eating disorder, unspecified type Sharon Allison stated Wellbutrin seems to be helping with emotional stress eating. She has decreased cravings.  3. At risk for heart disease Sharon Allison is at risk for heart disease due to hyperlipidemia.   Assessment/Plan:   1. Other hyperlipidemia Cardiovascular risk and specific lipid/LDL goals reviewed.  We discussed several lifestyle modifications today and Zeynab will continue to work on diet, exercise and weight loss efforts. Sharon Allison will continue taking Lipitor. Orders and follow up as documented in patient record.   Counseling Intensive lifestyle modifications are the first line treatment for this issue. Dietary changes: Increase soluble fiber. Decrease simple carbohydrates. Exercise changes: Moderate to vigorous-intensity aerobic activity 150 minutes per week if tolerated. Lipid-lowering medications: see documented in medical record.   2. Eating disorder, unspecified type Behavior modification techniques were discussed today to help Sharon Allison deal with her emotional/non-hunger eating behaviors.  We will refill Wellbutrin SR 150 mg for 1 month with no refills. Orders and follow up as documented in patient record.    - buPROPion (WELLBUTRIN SR) 150 MG 12 hr tablet; Take 1 tablet (150 mg total) by mouth daily.   Dispense: 30 tablet; Refill: 0  3. At risk for heart disease Sharon Allison was given approximately 15 minutes of coronary artery disease prevention counseling today. She is 65 y.o. female and has risk factors for heart disease including obesity. We discussed intensive lifestyle modifications today with an emphasis on specific weight loss instructions and strategies.   Repetitive spaced learning was employed today to elicit superior memory formation and behavioral change.   4. obesity, current BMI 42.4 Sharon Allison is currently in the action stage of change. As such, her goal is to continue with weight loss efforts. She has agreed to the Category 3 Plan.   Sharon Allison will continue meal planning and intentional eating. She will increase H2O.  Exercise goals:  Sharon Allison will start exercise class and will be out in the yard.  Behavioral modification strategies: increasing lean protein intake, decreasing simple carbohydrates, increasing vegetables, increasing water intake, decreasing eating out, no skipping meals, meal planning and cooking strategies, keeping healthy foods in the home, and planning for success.  Sharon Allison has agreed to follow-up with our clinic in 2-3 weeks. She was informed of the importance of frequent follow-up visits to maximize her success with intensive lifestyle modifications for her multiple health conditions.   Objective:   Blood pressure 133/82, pulse 63, temperature 97.8 F (36.6 C), height 5\' 2"  (1.575 m), weight 231 lb (104.8 kg), last menstrual period 07/10/2001. Body mass index is 42.25 kg/m.  General: Cooperative, alert, well developed, in no acute distress. HEENT: Conjunctivae and lids unremarkable. Cardiovascular: Regular rhythm.  Lungs: Normal work of breathing. Neurologic: No focal deficits.   Lab Results  Component Value Date   CREATININE 0.71 03/19/2021  BUN 11 03/19/2021   NA 139 03/19/2021   K 4.4 03/19/2021   CL 99 03/19/2021   CO2 23 03/19/2021   Lab Results   Component Value Date   ALT 18 03/19/2021   AST 16 03/19/2021   ALKPHOS 99 03/19/2021   BILITOT 0.5 03/19/2021   Lab Results  Component Value Date   HGBA1C 5.4 03/19/2021   HGBA1C 5.4 10/20/2020   HGBA1C 5.0 09/15/2017   HGBA1C 5.3 03/29/2016   HGBA1C 5.4 03/29/2015   Lab Results  Component Value Date   INSULIN 31.9 (H) 03/19/2021   INSULIN 39.5 (H) 02/14/2021   Lab Results  Component Value Date   TSH 2.09 10/20/2020   Lab Results  Component Value Date   CHOL 190 03/19/2021   HDL 44 03/19/2021   LDLCALC 123 (H) 03/19/2021   TRIG 130 03/19/2021   CHOLHDL 4 10/20/2020   Lab Results  Component Value Date   VD25OH 26.3 (L) 03/19/2021   VD25OH 34.8 08/13/2017   VD25OH 29.7 (L) 06/09/2017   Lab Results  Component Value Date   WBC 5.6 10/20/2020   HGB 13.8 10/20/2020   HCT 42.0 10/20/2020   MCV 91.7 10/20/2020   PLT 250.0 10/20/2020   No results found for: IRON, TIBC, FERRITIN  Attestation Statements:   Reviewed by clinician on day of visit: allergies, medications, problem list, medical history, surgical history, family history, social history, and previous encounter notes.   I, Lizbeth Bark, RMA, am acting as Location manager for [employer], DO.   I have reviewed the above documentation for accuracy and completeness, and I agree with the above. Jearld Lesch, DO

## 2021-06-06 ENCOUNTER — Encounter (INDEPENDENT_AMBULATORY_CARE_PROVIDER_SITE_OTHER): Payer: Self-pay | Admitting: Bariatrics

## 2021-06-07 ENCOUNTER — Telehealth: Payer: Self-pay | Admitting: *Deleted

## 2021-06-07 NOTE — Telephone Encounter (Signed)
Patient left VM that port not being removed until 08/08/21. Needs port flush since it has not been flushed since June. Would like to move her 11/15 lab/OV to December. Scheduling message sent.

## 2021-06-08 ENCOUNTER — Telehealth: Payer: Self-pay | Admitting: Oncology

## 2021-06-08 NOTE — Telephone Encounter (Signed)
Scheduled appt per 9/29 sch msg - left message appt with d/t

## 2021-06-13 ENCOUNTER — Other Ambulatory Visit: Payer: Self-pay

## 2021-06-13 ENCOUNTER — Encounter: Payer: Self-pay | Admitting: Oncology

## 2021-06-13 ENCOUNTER — Inpatient Hospital Stay: Payer: Medicare PPO | Attending: Oncology

## 2021-06-13 VITALS — BP 142/84 | HR 66 | Resp 20

## 2021-06-13 DIAGNOSIS — C182 Malignant neoplasm of ascending colon: Secondary | ICD-10-CM | POA: Insufficient documentation

## 2021-06-13 DIAGNOSIS — Z452 Encounter for adjustment and management of vascular access device: Secondary | ICD-10-CM | POA: Diagnosis present

## 2021-06-13 DIAGNOSIS — Z95828 Presence of other vascular implants and grafts: Secondary | ICD-10-CM

## 2021-06-13 MED ORDER — HEPARIN SOD (PORK) LOCK FLUSH 100 UNIT/ML IV SOLN
500.0000 [IU] | Freq: Once | INTRAVENOUS | Status: AC
  Administered 2021-06-13: 500 [IU]

## 2021-06-13 MED ORDER — SODIUM CHLORIDE 0.9% FLUSH
10.0000 mL | Freq: Once | INTRAVENOUS | Status: AC
  Administered 2021-06-13: 10 mL

## 2021-06-14 ENCOUNTER — Encounter: Payer: Self-pay | Admitting: Oncology

## 2021-06-26 ENCOUNTER — Other Ambulatory Visit: Payer: Self-pay

## 2021-06-26 ENCOUNTER — Encounter: Payer: Self-pay | Admitting: Oncology

## 2021-06-26 ENCOUNTER — Encounter (INDEPENDENT_AMBULATORY_CARE_PROVIDER_SITE_OTHER): Payer: Self-pay | Admitting: Bariatrics

## 2021-06-26 ENCOUNTER — Ambulatory Visit (INDEPENDENT_AMBULATORY_CARE_PROVIDER_SITE_OTHER): Payer: Medicare PPO | Admitting: Bariatrics

## 2021-06-26 VITALS — BP 148/86 | HR 66 | Temp 97.7°F | Ht 62.0 in | Wt 231.0 lb

## 2021-06-26 DIAGNOSIS — Z6841 Body Mass Index (BMI) 40.0 and over, adult: Secondary | ICD-10-CM

## 2021-06-26 DIAGNOSIS — E7800 Pure hypercholesterolemia, unspecified: Secondary | ICD-10-CM

## 2021-06-26 DIAGNOSIS — F509 Eating disorder, unspecified: Secondary | ICD-10-CM | POA: Diagnosis not present

## 2021-06-26 MED ORDER — BUPROPION HCL ER (SR) 150 MG PO TB12: 150.0000 mg | ORAL_TABLET | Freq: Every day | ORAL | 0 refills | Status: DC

## 2021-06-26 MED ORDER — ATORVASTATIN CALCIUM 10 MG PO TABS: 10.0000 mg | ORAL_TABLET | Freq: Every day | ORAL | 0 refills | Status: DC

## 2021-06-26 NOTE — Progress Notes (Signed)
Chief Complaint:   OBESITY Sharon Allison is here to discuss her progress with her obesity treatment plan along with follow-up of her obesity related diagnoses. Sharon Allison is on the Category 3 Plan and states she is following her eating plan approximately 65% of the time. Sharon Allison states she is doing Tai chi and hula hoop classes for 60 minutes 2-3 times per week and walking for 15 minutes 1 times per week.   Today's visit was #: 7 Starting weight: 239 lbs Starting date: 02/14/2021 Today's weight: 231 lbs Today's date: 06/26/2021 Total lbs lost to date: 8 lbs Total lbs lost since last in-office visit: 0  Interim History: Sharon Allison's weight remains the same. She is getting adequate water.   Subjective:   1. Elevated cholesterol Sharon Allison is taking her medications as directed.  2. Eating disorder, unspecified type Sharon Allison is taking Wellbutrin currently.   Assessment/Plan:   1. Elevated cholesterol Cardiovascular risk and specific lipid/LDL goals reviewed.  We discussed several lifestyle modifications today and Sharon Allison will continue to work on diet, exercise and weight loss efforts. We will refill Atorvastatin 10 mg for 3 months with no refills. Orders and follow up as documented in patient record.   Counseling Intensive lifestyle modifications are the first line treatment for this issue. Dietary changes: Increase soluble fiber. Decrease simple carbohydrates. Exercise changes: Moderate to vigorous-intensity aerobic activity 150 minutes per week if tolerated. Lipid-lowering medications: see documented in medical record.  - atorvastatin (LIPITOR) 10 MG tablet; Take 1 tablet (10 mg total) by mouth daily.  Dispense: 90 tablet; Refill: 0  2. Eating disorder, unspecified type Behavior modification techniques were discussed today to help Sharon Allison deal with her emotional/non-hunger eating behaviors.  We will refill Wellbutrin SR for 1 month with no refills. Orders and follow up as documented in patient record.     - buPROPion (WELLBUTRIN SR) 150 MG 12 hr tablet; Take 1 tablet (150 mg total) by mouth daily.  Dispense: 30 tablet; Refill: 0  3. Obesity, current BMI 42.4 Sharon Allison is currently in the action stage of change. As such, her goal is to continue with weight loss efforts. She has agreed to the Category 3 Plan.   Sharon Allison will continue meal planning and intentional eating.  Exercise goals:  As is.  Behavioral modification strategies: increasing lean protein intake, decreasing simple carbohydrates, increasing vegetables, increasing water intake, decreasing eating out, no skipping meals, meal planning and cooking strategies, keeping healthy foods in the home, and planning for success.  Sharon Allison has agreed to follow-up with our clinic in 2-3 weeks. She was informed of the importance of frequent follow-up visits to maximize her success with intensive lifestyle modifications for her multiple health conditions.   Objective:   Blood pressure (!) 148/86, pulse 66, temperature 97.7 F (36.5 C), height $RemoveBe'5\' 2"'[id number]$  (1.575 m), weight 231 lb (104.8 kg), last menstrual period 07/10/2001, SpO2 95 %. Body mass index is 42.25 kg/m.  General: Cooperative, alert, well developed, in no acute distress. HEENT: Conjunctivae and lids unremarkable. Cardiovascular: Regular rhythm.  Lungs: Normal work of breathing. Neurologic: No focal deficits.   Lab Results  Component Value Date   CREATININE 0.71 03/19/2021   BUN 11 03/19/2021   NA 139 03/19/2021   K 4.4 03/19/2021   CL 99 03/19/2021   CO2 23 03/19/2021   Lab Results  Component Value Date   ALT 18 03/19/2021   AST 16 03/19/2021   ALKPHOS 99 03/19/2021   BILITOT 0.5 03/19/2021   Lab Results  Component Value Date   HGBA1C 5.4 03/19/2021   HGBA1C 5.4 10/20/2020   HGBA1C 5.0 09/15/2017   HGBA1C 5.3 03/29/2016   HGBA1C 5.4 03/29/2015   Lab Results  Component Value Date   INSULIN 31.9 (H) 03/19/2021   INSULIN 39.5 (H) 02/14/2021   Lab Results  Component  Value Date   TSH 2.09 10/20/2020   Lab Results  Component Value Date   CHOL 190 03/19/2021   HDL 44 03/19/2021   LDLCALC 123 (H) 03/19/2021   TRIG 130 03/19/2021   CHOLHDL 4 10/20/2020   Lab Results  Component Value Date   VD25OH 26.3 (L) 03/19/2021   VD25OH 34.8 08/13/2017   VD25OH 29.7 (L) 06/09/2017   Lab Results  Component Value Date   WBC 5.6 10/20/2020   HGB 13.8 10/20/2020   HCT 42.0 10/20/2020   MCV 91.7 10/20/2020   PLT 250.0 10/20/2020   No results found for: IRON, TIBC, FERRITIN  Obesity Behavioral Intervention:   Approximately 15 minutes were spent on the discussion below.  ASK: We discussed the diagnosis of obesity with Sharon Allison today and Sharon Allison agreed to give [place] permission to discuss obesity behavioral modification therapy today.  ASSESS: Sharon Allison has the diagnosis of obesity and her BMI today is 42.4. Sharon Allison is in the action stage of change.   ADVISE: Sharon Allison was educated on the multiple health risks of obesity as well as the benefit of weight loss to improve her health. She was advised of the need for long term treatment and the importance of lifestyle modifications to improve her current health and to decrease her risk of future health problems.  AGREE: Multiple dietary modification options and treatment options were discussed and Sharon Allison agreed to follow the recommendations documented in the above note.  ARRANGE: Sharon Allison was educated on the importance of frequent visits to treat obesity as outlined per CMS and USPSTF guidelines and agreed to schedule her next follow up appointment today.  Attestation Statements:   Reviewed by clinician on day of visit: allergies, medications, problem list, medical history, surgical history, family history, social history, and previous encounter notes.  I, Lizbeth Bark, RMA, am acting as Location manager for [employer], DO.   I have reviewed the above documentation for accuracy and completeness, and I agree with the above. Jearld Lesch, DO

## 2021-06-27 ENCOUNTER — Encounter (INDEPENDENT_AMBULATORY_CARE_PROVIDER_SITE_OTHER): Payer: Self-pay | Admitting: Bariatrics

## 2021-07-04 ENCOUNTER — Encounter (HOSPITAL_COMMUNITY): Payer: Self-pay | Admitting: Gastroenterology

## 2021-07-04 NOTE — Progress Notes (Signed)
Attempted to obtain medical history via telephone, unable to reach at this time. I left a voicemail to return pre surgical testing department's phone call.  

## 2021-07-12 ENCOUNTER — Other Ambulatory Visit: Payer: Self-pay | Admitting: Nurse Practitioner

## 2021-07-12 DIAGNOSIS — Z1231 Encounter for screening mammogram for malignant neoplasm of breast: Secondary | ICD-10-CM

## 2021-07-13 ENCOUNTER — Ambulatory Visit (HOSPITAL_COMMUNITY): Admission: RE | Admit: 2021-07-13 | Payer: Medicare PPO | Source: Home / Self Care | Admitting: Gastroenterology

## 2021-07-13 SURGERY — COLONOSCOPY WITH PROPOFOL
Anesthesia: Monitor Anesthesia Care

## 2021-07-16 ENCOUNTER — Ambulatory Visit (INDEPENDENT_AMBULATORY_CARE_PROVIDER_SITE_OTHER): Payer: Medicare PPO | Admitting: Nurse Practitioner

## 2021-07-16 ENCOUNTER — Encounter: Payer: Self-pay | Admitting: Nurse Practitioner

## 2021-07-16 ENCOUNTER — Encounter: Payer: Self-pay | Admitting: Oncology

## 2021-07-16 ENCOUNTER — Other Ambulatory Visit: Payer: Self-pay

## 2021-07-16 ENCOUNTER — Ambulatory Visit: Payer: BC Managed Care – PPO | Admitting: Nurse Practitioner

## 2021-07-16 VITALS — BP 134/84 | Ht 62.0 in | Wt 240.0 lb

## 2021-07-16 DIAGNOSIS — Z9189 Other specified personal risk factors, not elsewhere classified: Secondary | ICD-10-CM

## 2021-07-16 DIAGNOSIS — Z23 Encounter for immunization: Secondary | ICD-10-CM | POA: Diagnosis not present

## 2021-07-16 DIAGNOSIS — Z780 Asymptomatic menopausal state: Secondary | ICD-10-CM

## 2021-07-16 DIAGNOSIS — Z01419 Encounter for gynecological examination (general) (routine) without abnormal findings: Secondary | ICD-10-CM

## 2021-07-16 NOTE — Progress Notes (Signed)
   Sharon Allison [birth year] [id number]   History:  65 y.o. G0 presents for breast and pelvic exam. Postmenopausal - no HRT. S/P 2002 TAH for fibroids. Normal pap and mammogram history. History of stage 3 colon cancer in 2019 - managed with chemo, in remission. DM, HLD, HTN managed by PCP.   Gynecologic History Patient's last menstrual period was 07/10/2001 (lmp unknown).   Contraception: status post hysterectomy Sexually active: No  Health Maintenance Last Pap: 04/30/2018. Results were: Normal Last mammogram: 08/24/2020. Results were: Normal Last colonoscopy: 06/30/2018. Results were: benign polyp, 3-year recall d/t personal history. Scheduled 09/07/2021 Last Dexa: 09/21/2019. Results were: Normal  Past medical history, past surgical history, family history and social history were all reviewed and documented in the EPIC chart. Retired from [employer]. 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   ROS:  A ROS was performed and pertinent positives and negatives are included.  Exam:  Vitals:   07/16/21 1326  BP: 134/84  Weight: 240 lb (108.9 kg)  Height: 5\' 2"  (1.575 m)   Body mass index is 43.9 kg/m.  General appearance:  Normal Thyroid:  Symmetrical, normal in size, without palpable masses or nodularity. Respiratory  Auscultation:  Clear without wheezing or rhonchi Cardiovascular  Auscultation:  Regular rate, without rubs, murmurs or gallops  Edema/varicosities:  Not grossly evident Abdominal  Soft,nontender, without masses, guarding or rebound.  Liver/spleen:  No organomegaly noted  Hernia:  None appreciated  Skin  Inspection:  Grossly normal Breasts: Examined lying and sitting.   Right: Without masses, retractions, nipple discharge or axillary adenopathy.   Left: Without masses, retractions, nipple discharge or axillary adenopathy. Genitourinary   Inguinal/mons:  Normal without inguinal adenopathy  External genitalia:  Normal appearing vulva with  no masses, tenderness, or lesions  BUS/Urethra/Skene's glands:  Normal  Vagina:  Normal appearing with normal color and discharge, no lesions  Cervix:  Absent  Uterus:  Absent  Adnexa/parametria:     Rt: Normal in size, without masses or tenderness.   Lt: Normal in size, without masses or tenderness.  Anus and perineum: Normal  Digital rectal exam: Normal sphincter tone without palpated masses or tenderness  Patient informed chaperone available to be present for breast and pelvic exam. Patient has requested no chaperone to be present. Patient has been advised what will be completed during breast and pelvic exam.   Assessment/Plan:  65 y.o. G0 for breast and pelvic exam.   Well female exam with routine gynecological exam - Education provided on SBEs, importance of preventative screenings, current guidelines, high calcium diet, regular exercise, and multivitamin daily.  Labs with PCP.  Postmenopausal - No HRT  Screening for cervical cancer - Normal Pap history.  No longer screening per guidelines.   Screening for breast cancer - Normal mammogram history.  Continue annual screenings.  Normal breast exam today.  Screening for colon cancer - History of stage 3 colon cancer in 2019 - managed with chemo, in remission. Up to date on colonoscopy. Scheduled 09/07/2021.  Screening for osteoporosis - Normal bone density 09/2019. Will repeat at 5-year interval per recommendation.   Return in 1 year for breast and pelvic exam.   Tamela Gammon DNP, 2:00 PM 07/16/2021

## 2021-07-17 ENCOUNTER — Encounter (INDEPENDENT_AMBULATORY_CARE_PROVIDER_SITE_OTHER): Payer: Self-pay | Admitting: Family Medicine

## 2021-07-17 ENCOUNTER — Ambulatory Visit (INDEPENDENT_AMBULATORY_CARE_PROVIDER_SITE_OTHER): Payer: Medicare PPO | Admitting: Family Medicine

## 2021-07-17 VITALS — BP 135/81 | HR 64 | Temp 97.7°F | Ht 62.0 in | Wt 235.0 lb

## 2021-07-17 DIAGNOSIS — Z6841 Body Mass Index (BMI) 40.0 and over, adult: Secondary | ICD-10-CM | POA: Diagnosis not present

## 2021-07-17 DIAGNOSIS — E559 Vitamin D deficiency, unspecified: Secondary | ICD-10-CM

## 2021-07-17 DIAGNOSIS — E8881 Metabolic syndrome: Secondary | ICD-10-CM | POA: Diagnosis not present

## 2021-07-17 NOTE — Progress Notes (Signed)
Chief Complaint:   OBESITY Sharon Allison is here to discuss her progress with her obesity treatment plan along with follow-up of her obesity related diagnoses. Sharon Allison is on the Category 3 Plan and states she is following her eating plan approximately 50% of the time. Sharon Allison states she is doing Tai Chi and hula-hoop classes 60 minutes 1 times per week.  Today's visit was #: 8 Starting weight: 239 lbs Starting date: 02/14/2021 Today's weight: 235 lbs Today's date: 07/17/2021 Total lbs lost to date: 4 Total lbs lost since last in-office visit: 0  Interim History: Sharon Allison feels she fell off the wagon and was eating bread and fried foods. She also thinks she was taking too many vitamins. Pt will be working over the Thanksgiving holiday. She has no upcoming other events or gatherings. She wants to recommit to the plan. Most, if not all, trigger foods are out of the house.  Subjective:   1. Insulin resistance Sharon Allison's last A1c was 5.4 and insulin level 31.9. She is not on meds.  2. Vitamin D deficiency Pt denies nausea, vomiting, and muscle weakness. Pt is on OTC Vit D 1,000 IU daily.  Assessment/Plan:   1. Insulin resistance Sharon Allison will continue to work on weight loss, exercise, and decreasing simple carbohydrates to help decrease the risk of diabetes. Sharon Allison agreed to follow-up with [place] as directed to closely monitor her progress. Repeat labs at next appt.  2. Vitamin D deficiency Low Vitamin D level contributes to fatigue and are associated with obesity, breast, and colon cancer. She agrees to continue to take OTC Vitamin D daily and will follow-up for routine testing of Vitamin D, at least 2-3 times per year to avoid over-replacement.  3. Class 3 severe obesity with serious comorbidity and body mass index (BMI) of 40.0 to 44.9 in adult, unspecified obesity type (HCC)  Sharon Allison is currently in the action stage of change. As such, her goal is to continue with weight loss efforts. She has agreed to the  Category 3 Plan.   Exercise goals: All adults should avoid inactivity. Some physical activity is better than none, and adults who participate in any amount of physical activity gain some health benefits.  Behavioral modification strategies: increasing lean protein intake, meal planning and cooking strategies, keeping healthy foods in the home, and holiday eating strategies .  Sharon Allison has agreed to follow-up with our clinic in 2-3 weeks. She was informed of the importance of frequent follow-up visits to maximize her success with intensive lifestyle modifications for her multiple health conditions.   Objective:   Blood pressure 135/81, pulse 64, temperature 97.7 F (36.5 C), height $RemoveBe'5\' 2"'eYLbhYOnD$  (1.575 m), weight 235 lb (106.6 kg), last menstrual period 07/10/2001, SpO2 97 %. Body mass index is 42.98 kg/m.  General: Cooperative, alert, well developed, in no acute distress. HEENT: Conjunctivae and lids unremarkable. Cardiovascular: Regular rhythm.  Lungs: Normal work of breathing. Neurologic: No focal deficits.   Lab Results  Component Value Date   CREATININE 0.71 03/19/2021   BUN 11 03/19/2021   NA 139 03/19/2021   K 4.4 03/19/2021   CL 99 03/19/2021   CO2 23 03/19/2021   Lab Results  Component Value Date   ALT 18 03/19/2021   AST 16 03/19/2021   ALKPHOS 99 03/19/2021   BILITOT 0.5 03/19/2021   Lab Results  Component Value Date   HGBA1C 5.4 03/19/2021   HGBA1C 5.4 10/20/2020   HGBA1C 5.0 09/15/2017   HGBA1C 5.3 03/29/2016   HGBA1C  5.4 03/29/2015   Lab Results  Component Value Date   INSULIN 31.9 (H) 03/19/2021   INSULIN 39.5 (H) 02/14/2021   Lab Results  Component Value Date   TSH 2.09 10/20/2020   Lab Results  Component Value Date   CHOL 190 03/19/2021   HDL 44 03/19/2021   LDLCALC 123 (H) 03/19/2021   TRIG 130 03/19/2021   CHOLHDL 4 10/20/2020   Lab Results  Component Value Date   VD25OH 26.3 (L) 03/19/2021   VD25OH 34.8 08/13/2017   VD25OH 29.7 (L) 06/09/2017    Lab Results  Component Value Date   WBC 5.6 10/20/2020   HGB 13.8 10/20/2020   HCT 42.0 10/20/2020   MCV 91.7 10/20/2020   PLT 250.0 10/20/2020    Attestation Statements:   Reviewed by clinician on day of visit: allergies, medications, problem list, medical history, surgical history, family history, social history, and previous encounter notes.  Coral Ceo, CMA, am acting as transcriptionist for Coralie Common, MD.   I have reviewed the above documentation for accuracy and completeness, and I agree with the above. - Coralie Common, MD

## 2021-07-24 ENCOUNTER — Other Ambulatory Visit: Payer: BC Managed Care – PPO

## 2021-07-24 ENCOUNTER — Ambulatory Visit: Payer: BC Managed Care – PPO | Admitting: Oncology

## 2021-08-05 NOTE — Progress Notes (Signed)
Follow Up Note  RE: ONEIKA SIMONIAN MRN: [id number] DOB: April 15, 1956 Date of Office Visit: 08/06/2021  Referring provider: Elise Benne Primary care provider: Mackie Pai, PA-C  Chief Complaint: Asthma (6 mth /u - going good)  History of Present Illness: I had the pleasure of seeing Destinae Neubecker for a follow up visit at the [facility] on 08/06/2021. She is a 65 y.o. female, who is being followed for asthma, chronic rhinitis. Her previous allergy office visit was on 01/31/2021 with Dr. Maudie Mercury. Today is a regular follow up visit.  Moderate persistent asthma Currently on Symbicort 171mcg 2 puffs once a day and no albuterol use.  Denies any ER/urgent care visits or prednisone use since the last visit.   Chronic rhinitis Patient ran out of her Xyzal, Singulair and noted increased PND and coughing.   Did not have to use Flonase   Assessment and Plan: Kennede is a 65 y.o. female with: Moderate persistent asthma without complication Controlled with below regimen.   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  Daily controller medication(s): continue Symbicort 147mcg 2 puffs once a day. Prior to physical activity: May use albuterol rescue inhaler 2 puffs 5 to 15 minutes prior to strenuous physical activities. Rescue medications: May use albuterol rescue inhaler 2 puffs or nebulizer every 4 to 6 hours as needed for shortness of breath, chest tightness, coughing, and wheezing. Monitor frequency of use.  During upper respiratory infections/asthma flares: Start Singulair 10mg  daily.  Increase Symbicort 167mcg to 2 puffs twice a day for 1-2 weeks. Get spirometry at next visit. Pfizer bivalent booster given today.   Chronic rhinitis Some PND since ran out of medications.  Use over the counter antihistamines such as Zyrtec (cetirizine), Claritin (loratadine), Allegra (fexofenadine), or Xyzal (levocetirizine) daily as needed. May switch antihistamines every few months. May use  Flonase 2 sprays per nostril daily for nasal congestion. May use azelastine nasal spray 1-2 spays per nostril 1-2 times a day as needed for drainage. May use Zaditor eye drops as needed 1-2 times a day for itchy/watery eyes.  For the dry sinuses: Use a cool mist humidifier during the winter months. May use saline gel in the nose as needed.   Return in about 6 months (around 02/03/2022).  Meds ordered this encounter  Medications   budesonide-formoterol (SYMBICORT) 160-4.5 MCG/ACT inhaler    Sig: Inhale 2 puffs into the lungs 2 (two) times daily.    Dispense:  10.2 g    Refill:  5   levocetirizine (XYZAL) 5 MG tablet    Sig: Take 1 tablet (5 mg total) by mouth as needed for allergies.    Dispense:  90 tablet    Refill:  2   montelukast (SINGULAIR) 10 MG tablet    Sig: Take 1 tablet (10 mg total) by mouth at bedtime.    Dispense:  90 tablet    Refill:  2    Lab Orders  No laboratory test(s) ordered today    Diagnostics: Spirometry:  Tracings reviewed. Her effort: Good reproducible efforts. FVC: 2.11L FEV1: 1.61L, 82% predicted FEV1/FVC ratio: 76% Interpretation: Spirometry consistent with normal pattern.  Please see scanned spirometry results for details.  Medication List:  Current Outpatient Medications  Medication Sig Dispense Refill   albuterol (VENTOLIN HFA) 108 (90 Base) MCG/ACT inhaler Inhale 2 puffs into the lungs every 4 (four) hours as needed for wheezing or shortness of breath. 18 g 1   atorvastatin (LIPITOR) 10 MG tablet Take 1  tablet (10 mg total) by mouth daily. (Patient taking differently: Take 10 mg by mouth at bedtime.) 90 tablet 0   Cholecalciferol (VITAMIN D3) 1000 units CAPS Take 1,000 Units by mouth daily.      CLENPIQ 10-3.5-12 MG-GM -GM/160ML SOLN See admin instructions.     diphenhydrAMINE (BENADRYL) 25 MG tablet Take 50 mg by mouth daily as needed for itching.     lidocaine-prilocaine (EMLA) cream Apply 1 application topically as needed.      budesonide-formoterol (SYMBICORT) 160-4.5 MCG/ACT inhaler Inhale 2 puffs into the lungs 2 (two) times daily. 10.2 g 5   levocetirizine (XYZAL) 5 MG tablet Take 1 tablet (5 mg total) by mouth as needed for allergies. 90 tablet 2   montelukast (SINGULAIR) 10 MG tablet Take 1 tablet (10 mg total) by mouth at bedtime. 90 tablet 2   No current facility-administered medications for this visit.   Facility-Administered Medications Ordered in Other Visits  Medication Dose Route Frequency Provider Last Rate Last Admin   famotidine (PEPCID) IVPB 20 mg premix  20 mg Intravenous Q12H Tanner, Lucianne Lei E., PA-C   Stopped at 01/21/18 1528   heparin lock flush 100 unit/mL  500 Units Intracatheter Once PRN Ladell Pier, MD       sodium chloride flush (NS) 0.9 % injection 10 mL  10 mL Intracatheter PRN Ladell Pier, MD       Allergies: Allergies  Allergen Reactions   Oxaliplatin Shortness Of Breath    Sweating    Wellbutrin [Bupropion] Itching and Other (See Comments)    Facial redness   Iohexol Swelling    Patient had swelling and itching to face in 1980's. Had 13 hour pre meds before scan 10/27/17   Iodine Swelling   Sulfa Antibiotics Itching and Other (See Comments)   I reviewed her past medical history, social history, family history, and environmental history and no significant changes have been reported from her previous visit.  Review of Systems  Constitutional:  Negative for appetite change, chills, fever and unexpected weight change.  HENT:  Positive for postnasal drip. Negative for congestion and rhinorrhea.   Eyes:  Negative for itching.  Respiratory:  Positive for cough. Negative for chest tightness, shortness of breath and wheezing.   Gastrointestinal:  Negative for abdominal pain.  Skin:  Negative for rash.  Neurological:  Negative for headaches.   Objective: BP 128/74   Pulse 76   Temp 97.7 F (36.5 C)   Resp 16   Ht 5\' 3"  (1.6 m)   Wt 238 lb 12.8 oz (108.3 kg)   LMP  07/10/2001 (LMP Unknown)   SpO2 97%   BMI 42.30 kg/m  Body mass index is 42.3 kg/m. Physical Exam Vitals and nursing note reviewed.  Constitutional:      Appearance: Normal appearance. She is well-developed.  HENT:     Head: Normocephalic and atraumatic.     Right Ear: External ear normal.     Left Ear: External ear normal.     Nose: Nose normal.     Mouth/Throat:     Mouth: Mucous membranes are moist.     Pharynx: Oropharynx is clear.  Eyes:     Conjunctiva/sclera: Conjunctivae normal.  Cardiovascular:     Rate and Rhythm: Normal rate and regular rhythm.     Heart sounds: Normal heart sounds. No murmur heard.   No friction rub. No gallop.  Pulmonary:     Effort: Pulmonary effort is normal.     Breath sounds:  Normal breath sounds. No wheezing or rales.  Musculoskeletal:     Cervical back: Neck supple.  Skin:    General: Skin is warm.     Findings: No rash.  Neurological:     Mental Status: She is alert and oriented to person, place, and time.  Psychiatric:        Behavior: Behavior normal.   Previous notes and tests were reviewed. The plan was reviewed with the patient/family, and all questions/concerned were addressed.  It was my pleasure to see Briahna today and participate in her care. Please feel free to contact me with any questions or concerns.  Sincerely,  Rexene Alberts, DO Allergy & Immunology  [facility] office: [place] office: [phone]

## 2021-08-06 ENCOUNTER — Other Ambulatory Visit: Payer: Self-pay

## 2021-08-06 ENCOUNTER — Ambulatory Visit (INDEPENDENT_AMBULATORY_CARE_PROVIDER_SITE_OTHER): Payer: Medicare PPO

## 2021-08-06 ENCOUNTER — Ambulatory Visit (INDEPENDENT_AMBULATORY_CARE_PROVIDER_SITE_OTHER): Payer: Medicare PPO | Admitting: Allergy

## 2021-08-06 ENCOUNTER — Encounter: Payer: Self-pay | Admitting: Allergy

## 2021-08-06 VITALS — BP 128/74 | HR 76 | Temp 97.7°F | Resp 16 | Ht 63.0 in | Wt 238.8 lb

## 2021-08-06 DIAGNOSIS — J4540 Moderate persistent asthma, uncomplicated: Secondary | ICD-10-CM | POA: Diagnosis not present

## 2021-08-06 DIAGNOSIS — Z23 Encounter for immunization: Secondary | ICD-10-CM

## 2021-08-06 DIAGNOSIS — J310 Chronic rhinitis: Secondary | ICD-10-CM | POA: Diagnosis not present

## 2021-08-06 MED ORDER — BUDESONIDE-FORMOTEROL FUMARATE 160-4.5 MCG/ACT IN AERO: 2.0000 | INHALATION_SPRAY | Freq: Two times a day (BID) | RESPIRATORY_TRACT | 5 refills | Status: DC

## 2021-08-06 MED ORDER — MONTELUKAST SODIUM 10 MG PO TABS: 10.0000 mg | ORAL_TABLET | Freq: Every day | ORAL | 2 refills | Status: DC

## 2021-08-06 MED ORDER — LEVOCETIRIZINE DIHYDROCHLORIDE 5 MG PO TABS: 5.0000 mg | ORAL_TABLET | ORAL | 2 refills | Status: DC | PRN

## 2021-08-06 NOTE — Assessment & Plan Note (Signed)
Some PND since ran out of medications.   Use over the counter antihistamines such as Zyrtec (cetirizine), Claritin (loratadine), Allegra (fexofenadine), or Xyzal (levocetirizine) daily as needed. May switch antihistamines every few months.  May use Flonase 2 sprays per nostril daily for nasal congestion.  May use azelastine nasal spray 1-2 spays per nostril 1-2 times a day as needed for drainage.  May use Zaditor eye drops as needed 1-2 times a day for itchy/watery eyes.   For the dry sinuses:  Use a cool mist humidifier during the winter months.  May use saline gel in the nose as needed.

## 2021-08-06 NOTE — Assessment & Plan Note (Signed)
Controlled with below regimen.   . 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  . Daily controller medication(s): continue Symbicort 171mcg 2 puffs once a day. . Prior to physical activity: May use albuterol rescue inhaler 2 puffs 5 to 15 minutes prior to strenuous physical activities. Marland Kitchen Rescue medications: May use albuterol rescue inhaler 2 puffs or nebulizer every 4 to 6 hours as needed for shortness of breath, chest tightness, coughing, and wheezing. Monitor frequency of use.  . During upper respiratory infections/asthma flares: Start Singulair 10mg  daily.  o Increase Symbicort 132mcg to 2 puffs twice a day for 1-2 weeks.  Get spirometry at next visit.  Pfizer bivalent booster given today.

## 2021-08-06 NOTE — Patient Instructions (Addendum)
Asthma: Daily controller medication(s): continue Symbicort 132mcg 2 puffs once a day. Prior to physical activity: May use albuterol rescue inhaler 2 puffs 5 to 15 minutes prior to strenuous physical activities. Rescue medications: May use albuterol rescue inhaler 2 puffs or nebulizer every 4 to 6 hours as needed for shortness of breath, chest tightness, coughing, and wheezing. Monitor frequency of use.  During upper respiratory infections/asthma flares: Start Singulair 10mg  daily.  Increase Symbicort 114mcg to 2 puffs twice a day for 1-2 weeks. Asthma control goals:  Full participation in all desired activities (may need albuterol before activity) Albuterol use two times or less a week on average (not counting use with activity) Cough interfering with sleep two times or less a month Oral steroids no more than once a year No hospitalizations  Rhinitis  Use over the counter antihistamines such as Zyrtec (cetirizine), Claritin (loratadine), Allegra (fexofenadine), or Xyzal (levocetirizine) daily as needed. May switch antihistamines every few months. May use Flonase 2 sprays per nostril daily for nasal congestion. May use azelastine nasal spray 1-2 spays per nostril 1-2 times a day as needed for drainage. May use Zaditor eye drops as needed 1-2 times a day for itchy/watery eyes.   Dry sinuses Use a cool mist humidifier during the winter months. May use saline gel in the nose as needed.   Follow up in 6 months or sooner if needed.  Training and development officer booster vaccine given today.

## 2021-08-07 ENCOUNTER — Ambulatory Visit (INDEPENDENT_AMBULATORY_CARE_PROVIDER_SITE_OTHER): Payer: Medicare PPO | Admitting: Bariatrics

## 2021-08-07 NOTE — Progress Notes (Signed)
   Covid-19 Vaccination Clinic  Name:  Sharon Allison    MRN: [id number] DOB: 07-29-1956  08/07/2021  Sharon Allison was observed post Covid-19 immunization for 15 minutes without incident. She was provided with Vaccine Information Sheet and instruction to access the V-Safe system.   Sharon Allison was instructed to call 911 with any severe reactions post vaccine: Difficulty breathing  Swelling of face and throat  A fast heartbeat  A bad rash all over body  Dizziness and weakness   Immunizations Administered     Name Date Dose VIS Date Route   Pfizer Covid-19 Vaccine Bivalent Booster 08/06/2021  4:01 PM 0.3 mL 05/09/2021 Intramuscular   Manufacturer: [place]   Lot: [id number]   [place]: [phone]

## 2021-08-21 ENCOUNTER — Other Ambulatory Visit: Payer: Self-pay

## 2021-08-21 ENCOUNTER — Inpatient Hospital Stay: Payer: Medicare PPO | Attending: Oncology

## 2021-08-21 VITALS — BP 153/76 | HR 62 | Temp 98.3°F | Resp 20

## 2021-08-21 DIAGNOSIS — C182 Malignant neoplasm of ascending colon: Secondary | ICD-10-CM | POA: Diagnosis not present

## 2021-08-21 DIAGNOSIS — Z452 Encounter for adjustment and management of vascular access device: Secondary | ICD-10-CM | POA: Diagnosis not present

## 2021-08-21 DIAGNOSIS — Z95828 Presence of other vascular implants and grafts: Secondary | ICD-10-CM

## 2021-08-21 MED ORDER — HEPARIN SOD (PORK) LOCK FLUSH 100 UNIT/ML IV SOLN
500.0000 [IU] | Freq: Once | INTRAVENOUS | Status: AC
  Administered 2021-08-21: 500 [IU]

## 2021-08-21 MED ORDER — SODIUM CHLORIDE 0.9% FLUSH
10.0000 mL | Freq: Once | INTRAVENOUS | Status: AC
  Administered 2021-08-21: 10 mL

## 2021-08-23 ENCOUNTER — Other Ambulatory Visit: Payer: Self-pay

## 2021-08-23 ENCOUNTER — Ambulatory Visit: Payer: Self-pay | Admitting: Oncology

## 2021-08-27 ENCOUNTER — Encounter (HOSPITAL_COMMUNITY): Payer: Self-pay | Admitting: Gastroenterology

## 2021-08-27 ENCOUNTER — Encounter: Payer: Self-pay | Admitting: Oncology

## 2021-08-27 ENCOUNTER — Telehealth: Payer: Self-pay | Admitting: Allergy

## 2021-08-27 ENCOUNTER — Ambulatory Visit
Admission: RE | Admit: 2021-08-27 | Discharge: 2021-08-27 | Disposition: A | Discharge status: Home / Self Care | Payer: Medicare PPO | Source: Ambulatory Visit | Attending: Nurse Practitioner | Admitting: Nurse Practitioner

## 2021-08-27 DIAGNOSIS — Z1231 Encounter for screening mammogram for malignant neoplasm of breast: Secondary | ICD-10-CM | POA: Diagnosis not present

## 2021-08-27 MED ORDER — ADVAIR HFA 230-21 MCG/ACT IN AERO: 2.0000 | INHALATION_SPRAY | Freq: Every day | RESPIRATORY_TRACT | 5 refills | Status: DC

## 2021-08-27 NOTE — Telephone Encounter (Signed)
Spoke with patient, informed her of Dr. Julianne Rice note. Patient verbalized understanding. She will check on prices and let us know. Patient may also look into the AZ&ME program.

## 2021-08-27 NOTE — Telephone Encounter (Signed)
I sent in Advair HFA 239mcg 2 puffs once a day. It won't tell me how much it will cost the patient.   Not sure if that's going to be cheaper.   Check the pricing for the following inhalers and let us know which one would be the cheapest. Symbicort has a program through their company where they can give free inhalers if patient has low income. She will have to fill out an application through the AZ&me program.  MemberVerification.[email or web address].za   Ruthe Mannan Advair HFA Advair Diskus Wixela Breo Airduo Respiclick Airduo Chiropodist

## 2021-08-27 NOTE — Telephone Encounter (Signed)
Patient stopped by with letter from insurance stating symbicort is not covered. Patient states she picked up medication after appointment and had to pay $80 for it. Patient is wondering if a cheaper alternative could be sent in for her.   [place], [place] [address]  Best contact number: [phone]

## 2021-08-27 NOTE — Progress Notes (Signed)
Attempted to obtain medical history via telephone, unable to reach at this time. I left a voicemail to return pre surgical testing department's phone call.  

## 2021-09-04 ENCOUNTER — Encounter (HOSPITAL_BASED_OUTPATIENT_CLINIC_OR_DEPARTMENT_OTHER): Payer: Self-pay | Admitting: Surgery

## 2021-09-05 ENCOUNTER — Ambulatory Visit: Payer: Self-pay | Admitting: Surgery

## 2021-09-07 ENCOUNTER — Ambulatory Visit (HOSPITAL_COMMUNITY): Payer: Medicare PPO | Admitting: Certified Registered Nurse Anesthetist

## 2021-09-07 ENCOUNTER — Encounter (HOSPITAL_COMMUNITY): Payer: Self-pay | Admitting: Gastroenterology

## 2021-09-07 ENCOUNTER — Ambulatory Visit (HOSPITAL_COMMUNITY)
Admission: RE | Admit: 2021-09-07 | Discharge: 2021-09-07 | Disposition: A | Discharge status: Home / Self Care | Payer: Medicare PPO | Source: Ambulatory Visit | Attending: Gastroenterology | Admitting: Gastroenterology

## 2021-09-07 ENCOUNTER — Other Ambulatory Visit: Payer: Self-pay

## 2021-09-07 ENCOUNTER — Encounter (HOSPITAL_COMMUNITY)
Admission: RE | Disposition: A | Discharge status: Home / Self Care | Payer: Self-pay | Source: Ambulatory Visit | Attending: Gastroenterology

## 2021-09-07 DIAGNOSIS — E559 Vitamin D deficiency, unspecified: Secondary | ICD-10-CM | POA: Diagnosis not present

## 2021-09-07 DIAGNOSIS — J4540 Moderate persistent asthma, uncomplicated: Secondary | ICD-10-CM | POA: Diagnosis not present

## 2021-09-07 DIAGNOSIS — Z85038 Personal history of other malignant neoplasm of large intestine: Secondary | ICD-10-CM | POA: Diagnosis not present

## 2021-09-07 DIAGNOSIS — Z980 Intestinal bypass and anastomosis status: Secondary | ICD-10-CM | POA: Insufficient documentation

## 2021-09-07 DIAGNOSIS — K219 Gastro-esophageal reflux disease without esophagitis: Secondary | ICD-10-CM | POA: Diagnosis not present

## 2021-09-07 DIAGNOSIS — Z79899 Other long term (current) drug therapy: Secondary | ICD-10-CM | POA: Insufficient documentation

## 2021-09-07 DIAGNOSIS — Z1211 Encounter for screening for malignant neoplasm of colon: Secondary | ICD-10-CM | POA: Diagnosis not present

## 2021-09-07 HISTORY — PX: COLONOSCOPY WITH PROPOFOL: SHX5780

## 2021-09-07 HISTORY — PX: OTHER SURGICAL HISTORY: SHX169

## 2021-09-07 SURGERY — COLONOSCOPY WITH PROPOFOL
Anesthesia: Monitor Anesthesia Care

## 2021-09-07 MED ORDER — LACTATED RINGERS IV SOLN: INTRAVENOUS | Status: DC | PRN

## 2021-09-07 MED ORDER — PROPOFOL 10 MG/ML IV BOLUS
INTRAVENOUS | Status: DC | PRN
  Administered 2021-09-07 (×2): 30 mg via INTRAVENOUS

## 2021-09-07 MED ORDER — HEPARIN SOD (PORK) LOCK FLUSH 100 UNIT/ML IV SOLN
500.0000 [IU] | INTRAVENOUS | Status: AC | PRN
  Administered 2021-09-07: 11:00:00 500 [IU]
  Filled 2021-09-07: qty 5

## 2021-09-07 MED ORDER — PROPOFOL 500 MG/50ML IV EMUL
INTRAVENOUS | Status: DC | PRN
  Administered 2021-09-07: 175 ug/kg/min via INTRAVENOUS

## 2021-09-07 MED ORDER — PROPOFOL 500 MG/50ML IV EMUL
INTRAVENOUS | Status: AC
  Filled 2021-09-07: qty 100

## 2021-09-07 MED ORDER — PROPOFOL 10 MG/ML IV BOLUS
INTRAVENOUS | Status: AC
  Filled 2021-09-07: qty 20

## 2021-09-07 SURGICAL SUPPLY — 22 items

## 2021-09-07 NOTE — Anesthesia Preprocedure Evaluation (Addendum)
Anesthesia Evaluation  Patient identified by MRN, date of birth, ID band Patient awake    Reviewed: Allergy & Precautions, NPO status , Patient's Chart, lab work & pertinent test results  History of Anesthesia Complications Negative for: history of anesthetic complications  Airway Mallampati: I  TM Distance: >3 FB Neck ROM: Full    Dental  (+) Teeth Intact, Dental Advisory Given   Pulmonary shortness of breath and with exertion, sleep apnea and Continuous Positive Airway Pressure Ventilation , COPD (has not used a rescue inhaler in over a year),  COPD inhaler,    breath sounds clear to auscultation       Cardiovascular hypertension,  Rhythm:Regular Rate:Normal     Neuro/Psych negative neurological ROS     GI/Hepatic Neg liver ROS, GERD  Controlled,H/o colon ca   Endo/Other  Morbid obesity  Renal/GU negative Renal ROS     Musculoskeletal   Abdominal (+) + obese,   Peds  Hematology negative hematology ROS (+)   Anesthesia Other Findings   Reproductive/Obstetrics                            Anesthesia Physical Anesthesia Plan  ASA: 3  Anesthesia Plan: MAC   Post-op Pain Management: Minimal or no pain anticipated   Induction:   PONV Risk Score and Plan: 2 and Treatment may vary due to age or medical condition  Airway Management Planned: Natural Airway and Simple Face Mask  Additional Equipment: None  Intra-op Plan:   Post-operative Plan:   Informed Consent: I have reviewed the patients History and Physical, chart, labs and discussed the procedure including the risks, benefits and alternatives for the proposed anesthesia with the patient or authorized representative who has indicated his/her understanding and acceptance.     Dental advisory given  Plan Discussed with: CRNA and Surgeon  Anesthesia Plan Comments:        Anesthesia Quick Evaluation

## 2021-09-07 NOTE — Op Note (Signed)
[facility] Patient Name: Sharon Allison Procedure Date: 09/07/2021 MRN: [id number] Attending MD: Carol Ada , MD Date of Birth: August 28, 1956 CSN: [id number] Age: 65 Admit Type: Outpatient Procedure:                Colonoscopy Indications:              High risk colon cancer surveillance: Personal                            history of colon cancer Providers:                Carol Ada, MD, Jaci Carrel, RN, Luan Moore, Technician, Dellie Catholic Referring MD:              Medicines:                Propofol per Anesthesia Complications:            No immediate complications. Estimated Blood Loss:     Estimated blood loss: none. Procedure:                Pre-Anesthesia Assessment:                           - Prior to the procedure, a History and Physical                            was performed, and patient medications and                            allergies were reviewed. [name] tolerance of                            previous anesthesia was also reviewed. The risks                            and benefits of the procedure and the sedation                            options and risks were discussed with the patient.                            All questions were answered, and informed consent                            was obtained. Prior Anticoagulants: The patient has                            taken no previous anticoagulant or antiplatelet                            agents. ASA Grade Assessment: III - A patient with  severe systemic disease. After reviewing the risks                            and benefits, the patient was deemed in                            satisfactory condition to undergo the procedure.                           - Sedation was administered by an anesthesia                            professional. Deep sedation was attained.                           After obtaining informed  consent, the colonoscope                            was passed under direct vision. Throughout the                            procedure, the patient's blood pressure, pulse, and                            oxygen saturations were monitored continuously. The                            [id number]-HQ190L ([id number] Olympus colonoscope was                            introduced through the anus and advanced to the the                            ileocolonic anastomosis. The colonoscopy was                            performed without difficulty. The patient tolerated                            the procedure well. The quality of the bowel                            preparation was evaluated using the BBPS [facility]                            Bowel Preparation Scale) with scores of: Right                            Colon = 3, Transverse Colon = 3 and Left Colon = 3                            (entire mucosa seen well with no residual staining,  small fragments of stool or opaque liquid). The                            total BBPS score equals 9. The terminal ileum was                            photographed. Scope In: 10:21:43 AM Scope Out: 10:42:02 AM Scope Withdrawal Time: 0 hours 18 minutes 7 seconds  Total Procedure Duration: 0 hours 20 minutes 19 seconds  Findings:      There was evidence of a prior functional end-to-end ileo-colonic       anastomosis in the transverse colon. This was patent and was       characterized by healthy appearing mucosa. The anastomosis was traversed. 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                           - No specimens collected. Moderate Sedation:      Not Applicable - Patient had care per Anesthesia. Recommendation:           - Patient has a contact number available for                             emergencies. The signs and symptoms of potential                            delayed complications were discussed with the                            patient. Return to normal activities tomorrow.                            Written discharge instructions were provided to the                            patient.                           - Resume previous diet.                           - Continue present medications.                           - Repeat colonoscopy in 5 years for surveillance. Procedure Code(s):        --- Professional ---                           [phone], Colonoscopy, flexible; diagnostic, including  collection of specimen(s) by brushing or washing,                            when performed (separate procedure) Diagnosis Code(s):        --- Professional ---                           S92.330, Personal history of other malignant                            neoplasm of large intestine                           Z98.0, Intestinal bypass and anastomosis status CPT copyright 2019 American Medical Association. All rights reserved. The codes documented in this report are preliminary and upon coder review may  be revised to meet current compliance requirements. Carol Ada, MD Carol Ada, MD 09/07/2021 10:46:37 AM This report has been signed electronically. Number of Addenda: 0

## 2021-09-07 NOTE — Anesthesia Procedure Notes (Signed)
Procedure Name: MAC Date/Time: 09/07/2021 10:12 AM Performed by: Claudia Desanctis, CRNA Pre-anesthesia Checklist: Patient identified, Emergency Drugs available, Suction available and Patient being monitored Patient Re-evaluated:Patient Re-evaluated prior to induction Oxygen Delivery Method: Simple face mask

## 2021-09-07 NOTE — Anesthesia Postprocedure Evaluation (Signed)
Anesthesia Post Note  Patient: Sharon Allison  Procedure(s) Performed: COLONOSCOPY WITH PROPOFOL     Patient location during evaluation: Endoscopy Anesthesia Type: MAC Level of consciousness: awake and alert, patient cooperative and oriented Pain management: pain level controlled Vital Signs Assessment: post-procedure vital signs reviewed and stable Respiratory status: nonlabored ventilation, spontaneous breathing and respiratory function stable Cardiovascular status: blood pressure returned to baseline and stable Postop Assessment: no apparent nausea or vomiting and able to ambulate Anesthetic complications: no   No notable events documented.  Last Vitals:  Vitals:   09/07/21 1104 09/07/21 1110  BP:  (!) 137/56  Pulse: 66 62  Resp: 11 17  Temp:    SpO2: 96% 97%    Last Pain:  Vitals:   09/07/21 1050  TempSrc: Temporal  PainSc:                  Pollie Poma,E. Jurnie Garritano

## 2021-09-07 NOTE — H&P (Signed)
Sharon Allison HPI: This 65 year old black female presents to the office for colorectal cancer screening. She had a colonoscopy done on 05/21/17 that revealed internal hemorrhoids, a large flat lesion carpeting half of the cecum that was partially removed-a tubulovillous adenoma and a hyperplastic polyp was removed. She was converted from a laparoscopic procedure to open right hemicolectomy on 09/18/17 by Dr. Nadeen Landau. The pathology revealed invasive adenocarcinoma. She completed 12 cycles of FOLFOX that began on 03/31/18. She has 3 BM's per day with no obvious blood or mucus in the stool. She takes Prevcid for acid reflux with good control. She has a good appetite. She has gained 16 pounds over the past 3 years. She denies having any complaints of abdominal pain, nausea, vomiting, dysphagia or odynophagia. She denies having a family history of colon cancer, celiac sprue or IBD. Her last colonoscopy was done 06/30/2018 that revealed patent end-to end ileo-colonic anastomosis and a tubular adenoma was removed.  Past Medical History:  Diagnosis Date   Anemia    Asthma    Back pain    Bilateral swelling of feet and ankles    colon ca dx'd 09/2017   colon cancer   Colon polyp    Fibroid    GERD (gastroesophageal reflux disease)    Heart murmur    per patient " dr white told me i had a murmur but i told him they told me that when i was a kid and it had never bothered me "    History of colon cancer    Hyperlipidemia    Hypertension    Other fatigue    Shortness of breath    Shortness of breath on exertion    Sleep apnea    Vitamin D deficiency     Past Surgical History:  Procedure Laterality Date   ABDOMINAL HYSTERECTOMY     2002   COLONOSCOPY N/A 09/18/2017   Procedure: COLONOSCOPY;  Surgeon: Ileana Roup, MD;  Location: [facility];  Service: General;  Laterality: N/A;   COLONOSCOPY WITH PROPOFOL N/A 06/30/2018   Procedure: COLONOSCOPY WITH PROPOFOL;  Surgeon: Juanita Craver, MD;   Location: [facility];  Service: Endoscopy;  Laterality: N/A;   LAPAROSCOPIC RIGHT HEMI COLECTOMY Right 09/18/2017   Procedure: LAPAROSCOPIC RIGHT HEMI COLECTOMY ERAS PATHWAY;  Surgeon: Ileana Roup, MD;  Location: [facility];  Service: General;  Laterality: Right;   MYOMECTOMY  DONE 5 YEARS PRIOR TO HYSTERECTOMY   multiple fibroids   POLYPECTOMY  06/30/2018   Procedure: POLYPECTOMY;  Surgeon: Juanita Craver, MD;  Location: [facility];  Service: Endoscopy;;   PORTACATH PLACEMENT N/A 10/17/2017   Procedure: INSERTION PORT-A-CATH;  Surgeon: Ileana Roup, MD;  Location[facility];  Service: General;  Laterality: N/A;    Family History  Problem Relation Age of Onset   Hypertension Mother    Leukemia Mother    Diabetes Mother    Obesity Mother    Lupus Father    Leukemia Sister    Kidney disease Brother    Leukemia Brother    Hodgkin's lymphoma Maternal Grandmother    Cancer Maternal Grandfather        prostate   Obesity Other    Allergic rhinitis Neg Hx    Asthma Neg Hx    Eczema Neg Hx    Urticaria Neg Hx     Social History:  reports that she has never smoked. She has never used smokeless tobacco. She reports current alcohol use. She reports  that she does not use drugs.  Allergies:  Allergies  Allergen Reactions   Oxaliplatin Shortness Of Breath    Sweating    Wellbutrin [Bupropion] Itching and Other (See Comments)    Facial redness   Iohexol Swelling    Patient had swelling and itching to face in 1980's. Had 13 hour pre meds before scan 10/27/17   Iodine Swelling   Sulfa Antibiotics Itching and Other (See Comments)    Medications: Scheduled: Continuous:  No results found for this or any previous visit (from the past 24 hour(s)).   No results found.  ROS:  As stated above in the HPI otherwise negative.  Blood pressure 123/68, pulse 75, temperature 98 F (36.7 C), temperature source Oral, resp. rate 15, height 5\' 3"  (1.6 m), weight 108 kg, last menstrual  period 07/10/2001, SpO2 98 %.    PE: Gen: NAD, Alert and Oriented HEENT:  [place]/AT, EOMI Neck: Supple, no LAD Lungs: CTA Bilaterally CV: RRR without M/G/R ABD: Soft, NTND, +BS Ext: No C/C/E  Assessment/Plan: 1) Personal history of colon cancer.  Tuesday Terlecki D 09/07/2021, 9:51 AM

## 2021-09-07 NOTE — Transfer of Care (Signed)
Immediate Anesthesia Transfer of Care Note  Patient: SHAMA MONFILS  Procedure(s) Performed: COLONOSCOPY WITH PROPOFOL  Patient Location: Endoscopy Unit  Anesthesia Type:MAC  Level of Consciousness: awake, alert , oriented and patient cooperative  Airway & Oxygen Therapy: Patient Spontanous Breathing and Patient connected to face mask  Post-op Assessment: Report given to RN and Post -op Vital signs reviewed and stable  Post vital signs: Reviewed and stable  Last Vitals:  Vitals Value Taken Time  BP 120/68 09/07/21 1047  Temp    Pulse 75 09/07/21 1048  Resp 24 09/07/21 1048  SpO2 100 % 09/07/21 1048  Vitals shown include unvalidated device data.  Last Pain:  Vitals:   09/07/21 0934  TempSrc: Oral  PainSc: 0-No pain         Complications: No notable events documented.

## 2021-09-11 ENCOUNTER — Encounter (HOSPITAL_BASED_OUTPATIENT_CLINIC_OR_DEPARTMENT_OTHER): Payer: Self-pay | Admitting: Surgery

## 2021-09-11 ENCOUNTER — Other Ambulatory Visit: Payer: Self-pay

## 2021-09-11 NOTE — Progress Notes (Signed)
Spoke w/ via phone for pre-op interview---pt Lab needs dos----  I stat             Lab results------ekg 02-14-2021 epic COVID test -----patient states asymptomatic no test needed Arrive at -------630 am 09-12-2021 NPO after MN NO Solid Food.  Clear liquids from MN until---530 am Med rec completed Medications to take morning of surgery -----use bring albuterol inhaler, symbicort, zyban prevacid prn Diabetic medication -----n/a Patient instructed no nail polish to be worn day of surgery Patient instructed to bring photo id and insurance card day of surgery Patient aware to have Driver (ride ) / caregiver    for 24 hours after surgery  friend [place] cell [phone] Patient Special Instructions -----bring cpap mask tuibng and machine and leave in car Pre-Op special Istructions -----none Patient verbalized understanding of instructions that were given at this phone interview. Patient denies shortness of breath, chest pain, fever, cough at this phone interview.

## 2021-09-11 NOTE — Anesthesia Preprocedure Evaluation (Addendum)
Anesthesia Evaluation  Patient identified by MRN, date of birth, ID band Patient awake    Reviewed: Allergy & Precautions, NPO status , Patient's Chart, lab work & pertinent test results  History of Anesthesia Complications Negative for: history of anesthetic complications  Airway Mallampati: II  TM Distance: >3 FB Neck ROM: Full    Dental no notable dental hx.    Pulmonary asthma , sleep apnea and Continuous Positive Airway Pressure Ventilation ,    Pulmonary exam normal        Cardiovascular hypertension, Normal cardiovascular exam     Neuro/Psych negative neurological ROS  negative psych ROS   GI/Hepatic Neg liver ROS, GERD  Controlled,  Endo/Other  Morbid obesity  Renal/GU negative Renal ROS  negative genitourinary   Musculoskeletal negative musculoskeletal ROS (+)   Abdominal   Peds  Hematology negative hematology ROS (+)   Anesthesia Other Findings Day of surgery medications reviewed with patient.  Reproductive/Obstetrics negative OB ROS                            Anesthesia Physical Anesthesia Plan  ASA: 3  Anesthesia Plan: MAC   Post-op Pain Management: Tylenol PO (pre-op)   Induction:   PONV Risk Score and Plan: 2 and Treatment may vary due to age or medical condition and Propofol infusion  Airway Management Planned: Simple Face Mask and Natural Airway  Additional Equipment: None  Intra-op Plan:   Post-operative Plan:   Informed Consent: I have reviewed the patients History and Physical, chart, labs and discussed the procedure including the risks, benefits and alternatives for the proposed anesthesia with the patient or authorized representative who has indicated his/her understanding and acceptance.       Plan Discussed with: CRNA  Anesthesia Plan Comments:        Anesthesia Quick Evaluation

## 2021-09-12 ENCOUNTER — Encounter (HOSPITAL_BASED_OUTPATIENT_CLINIC_OR_DEPARTMENT_OTHER): Payer: Self-pay | Admitting: Surgery

## 2021-09-12 ENCOUNTER — Ambulatory Visit (HOSPITAL_BASED_OUTPATIENT_CLINIC_OR_DEPARTMENT_OTHER): Payer: Medicare PPO | Admitting: Anesthesiology

## 2021-09-12 ENCOUNTER — Ambulatory Visit (HOSPITAL_BASED_OUTPATIENT_CLINIC_OR_DEPARTMENT_OTHER)
Admission: RE | Admit: 2021-09-12 | Discharge: 2021-09-12 | Disposition: A | Discharge status: Home / Self Care | Payer: Medicare PPO | Source: Ambulatory Visit | Attending: Surgery | Admitting: Surgery

## 2021-09-12 ENCOUNTER — Encounter (HOSPITAL_BASED_OUTPATIENT_CLINIC_OR_DEPARTMENT_OTHER)
Admission: RE | Disposition: A | Discharge status: Home / Self Care | Payer: Self-pay | Source: Ambulatory Visit | Attending: Surgery

## 2021-09-12 DIAGNOSIS — Z85038 Personal history of other malignant neoplasm of large intestine: Secondary | ICD-10-CM | POA: Diagnosis not present

## 2021-09-12 DIAGNOSIS — Z452 Encounter for adjustment and management of vascular access device: Secondary | ICD-10-CM | POA: Diagnosis not present

## 2021-09-12 HISTORY — PX: PORT-A-CATH REMOVAL: SHX5289

## 2021-09-12 HISTORY — DX: Presence of spectacles and contact lenses: Z97.3

## 2021-09-12 LAB — POCT I-STAT, CHEM 8
BUN: 12 mg/dL (ref 8–23)
Calcium, Ion: 1.35 mmol/L (ref 1.15–1.40)
Chloride: 101 mmol/L (ref 98–111)
Creatinine, Ser: 0.6 mg/dL (ref 0.44–1.00)
Glucose, Bld: 99 mg/dL (ref 70–99)
HCT: 42 % (ref 36.0–46.0)
Hemoglobin: 14.3 g/dL (ref 12.0–15.0)
Potassium: 3.7 mmol/L (ref 3.5–5.1)
Sodium: 137 mmol/L (ref 135–145)
TCO2: 26 mmol/L (ref 22–32)

## 2021-09-12 SURGERY — REMOVAL PORT-A-CATH
Anesthesia: Monitor Anesthesia Care | Site: Chest

## 2021-09-12 MED ORDER — CHLORHEXIDINE GLUCONATE CLOTH 2 % EX PADS: 6.0000 | MEDICATED_PAD | Freq: Once | CUTANEOUS | Status: DC

## 2021-09-12 MED ORDER — ACETAMINOPHEN 500 MG PO TABS
ORAL_TABLET | ORAL | Status: AC
  Filled 2021-09-12: qty 2

## 2021-09-12 MED ORDER — LACTATED RINGERS IV SOLN: INTRAVENOUS | Status: DC

## 2021-09-12 MED ORDER — LIDOCAINE 2% (20 MG/ML) 5 ML SYRINGE
INTRAMUSCULAR | Status: DC | PRN
  Administered 2021-09-12: 440 mg via INTRAVENOUS

## 2021-09-12 MED ORDER — 0.9 % SODIUM CHLORIDE (POUR BTL) OPTIME
TOPICAL | Status: DC | PRN
Start: 2021-09-12 — End: 2021-09-12
  Administered 2021-09-12: 500 mL

## 2021-09-12 MED ORDER — PROPOFOL 500 MG/50ML IV EMUL
INTRAVENOUS | Status: DC | PRN
  Administered 2021-09-12: 200 ug/kg/min via INTRAVENOUS

## 2021-09-12 MED ORDER — PROPOFOL 10 MG/ML IV BOLUS
INTRAVENOUS | Status: DC | PRN
  Administered 2021-09-12: 20 mg via INTRAVENOUS
  Administered 2021-09-12: 30 mg via INTRAVENOUS

## 2021-09-12 MED ORDER — LIDOCAINE 2% (20 MG/ML) 5 ML SYRINGE
INTRAMUSCULAR | Status: AC
  Filled 2021-09-12: qty 5

## 2021-09-12 MED ORDER — CEFAZOLIN SODIUM-DEXTROSE 2-4 GM/100ML-% IV SOLN
INTRAVENOUS | Status: AC
  Filled 2021-09-12: qty 100

## 2021-09-12 MED ORDER — TRAMADOL HCL 50 MG PO TABS: 50.0000 mg | ORAL_TABLET | Freq: Four times a day (QID) | ORAL | 0 refills | Status: AC | PRN

## 2021-09-12 MED ORDER — FENTANYL CITRATE (PF) 100 MCG/2ML IJ SOLN: 25.0000 ug | INTRAMUSCULAR | Status: DC | PRN

## 2021-09-12 MED ORDER — PROPOFOL 1000 MG/100ML IV EMUL
INTRAVENOUS | Status: AC
  Filled 2021-09-12: qty 100

## 2021-09-12 MED ORDER — ACETAMINOPHEN 500 MG PO TABS
1000.0000 mg | ORAL_TABLET | Freq: Once | ORAL | Status: AC
  Administered 2021-09-12: 1000 mg via ORAL

## 2021-09-12 MED ORDER — CEFAZOLIN SODIUM-DEXTROSE 2-4 GM/100ML-% IV SOLN
2.0000 g | INTRAVENOUS | Status: AC
  Administered 2021-09-12: 2 g via INTRAVENOUS

## 2021-09-12 MED ORDER — BUPIVACAINE-EPINEPHRINE 0.5% -1:200000 IJ SOLN
INTRAMUSCULAR | Status: DC | PRN
  Administered 2021-09-12: 10 mL

## 2021-09-12 SURGICAL SUPPLY — 35 items
ADH SKN CLS APL DERMABOND .7 (GAUZE/BANDAGES/DRESSINGS) ×1
APL PRP STRL LF DISP 70% ISPRP (MISCELLANEOUS) ×1
BLADE CLIPPER SENSICLIP SURGIC (BLADE) IMPLANT
BLADE SURG 15 STRL LF DISP TIS (BLADE) ×1 IMPLANT
BLADE SURG 15 STRL SS (BLADE) ×2
CANISTER SUCT 1200ML W/VALVE (MISCELLANEOUS) IMPLANT
CHLORAPREP W/TINT 26 (MISCELLANEOUS) ×2 IMPLANT
COVER BACK TABLE 60X90IN (DRAPES) ×2 IMPLANT
COVER MAYO STAND STRL (DRAPES) ×2 IMPLANT
DERMABOND ADVANCED (GAUZE/BANDAGES/DRESSINGS) ×1
DERMABOND ADVANCED .7 DNX12 (GAUZE/BANDAGES/DRESSINGS) ×1 IMPLANT
DRAPE LAPAROTOMY TRNSV 102X78 (DRAPES) ×2 IMPLANT
DRAPE UTILITY XL STRL (DRAPES) ×2 IMPLANT
ELECT REM PT RETURN 9FT ADLT (ELECTROSURGICAL) ×2
ELECTRODE REM PT RTRN 9FT ADLT (ELECTROSURGICAL) ×1 IMPLANT
GAUZE 4X4 16PLY ~~LOC~~+RFID DBL (SPONGE) ×2 IMPLANT
GAUZE SPONGE 4X4 12PLY STRL (GAUZE/BANDAGES/DRESSINGS) IMPLANT
GLOVE SRG 8 PF TXTR STRL LF DI (GLOVE) ×1 IMPLANT
GLOVE SURG ENC MOIS LTX SZ7.5 (GLOVE) ×2 IMPLANT
GLOVE SURG UNDER POLY LF SZ7.5 (GLOVE) ×1 IMPLANT
GLOVE SURG UNDER POLY LF SZ8 (GLOVE) ×2
GOWN STRL REUS W/TWL XL LVL3 (GOWN DISPOSABLE) ×2 IMPLANT
KIT TURNOVER CYSTO (KITS) ×2 IMPLANT
NEEDLE HYPO 22GX1.5 SAFETY (NEEDLE) ×2 IMPLANT
NS IRRIG 500ML POUR BTL (IV SOLUTION) IMPLANT
PACK BASIN DAY SURGERY FS (CUSTOM PROCEDURE TRAY) ×2 IMPLANT
PENCIL SMOKE EVACUATOR (MISCELLANEOUS) ×2 IMPLANT
SPONGE T-LAP 4X18 ~~LOC~~+RFID (SPONGE) IMPLANT
SUT MNCRL AB 4-0 PS2 18 (SUTURE) ×2 IMPLANT
SUT VIC AB 3-0 SH 27 (SUTURE) ×2
SUT VIC AB 3-0 SH 27X BRD (SUTURE) ×1 IMPLANT
SYR CONTROL 10ML LL (SYRINGE) ×2 IMPLANT
TOWEL OR 17X26 10 PK STRL BLUE (TOWEL DISPOSABLE) ×2 IMPLANT
TUBE CONNECTING 12X1/4 (SUCTIONS) IMPLANT
YANKAUER SUCT BULB TIP NO VENT (SUCTIONS) IMPLANT

## 2021-09-12 NOTE — H&P (Signed)
CC: here today for port removal  HPI: Sharon Allison is an 66 y.o. 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 She underwent adjuvant chemotherapy via port-a-cath which we placed 10/17/17. This is no longer necessary and we have been asked to remove. She denies any changes in her health, health history, or taking any blood thinners. Denies any issues with her port aside from having it in place.  Past Medical History:  Diagnosis Date   Asthma    colon ca dx'd 09/2017   colon cancer   Colon polyp    GERD (gastroesophageal reflux disease)    Heart murmur    per patient " dr Toinette Lackie told me i had a murmur but i told him they told me that when i was a kid and it had never bothered me "    History of colon cancer    Hyperlipidemia    Hypertension    Shortness of breath on exertion    Sleep apnea    uses cpap on auto set   Vitamin D deficiency    Wears glasses     Past Surgical History:  Procedure Laterality Date   ABDOMINAL HYSTERECTOMY     2002   COLONOSCOPY N/A 09/18/2017   Procedure: COLONOSCOPY;  Surgeon: Ileana Roup, MD;  [facility]: [facility];  Service: General;  Laterality: N/A;   COLONOSCOPY WITH PROPOFOL N/A 06/30/2018   Procedure: COLONOSCOPY WITH PROPOFOL;  Surgeon: Juanita Craver, MD;  [facility];  Service: Endoscopy;  Laterality: N/A;   COLONOSCOPY WITH PROPOFOL N/A 09/07/2021   Procedure: COLONOSCOPY WITH PROPOFOL;  Surgeon: Carol Ada, MD;  [facility];  Service: Endoscopy;  Laterality: N/A;   colonscopy  09/07/2021   LAPAROSCOPIC RIGHT HEMI COLECTOMY Right 09/18/2017   Procedure: LAPAROSCOPIC RIGHT HEMI COLECTOMY ERAS PATHWAY;  Surgeon: Ileana Roup, MD;  [facility];  Service: General;  Laterality: Right;   MYOMECTOMY  DONE 5 YEARS PRIOR TO HYSTERECTOMY   multiple fibroids    POLYPECTOMY  06/30/2018   Procedure: POLYPECTOMY;  Surgeon: Juanita Craver, MD;  [facility];  Service: Endoscopy;;   PORTACATH PLACEMENT N/A 10/17/2017   Procedure: INSERTION PORT-A-CATH;  Surgeon: Ileana Roup, MD;  Location: [facility];  Service: General;  Laterality: N/A;    Family History  Problem Relation Age of Onset   Hypertension Mother    Leukemia Mother    Diabetes Mother    Obesity Mother    Lupus Father    Leukemia Sister    Kidney disease Brother    Leukemia Brother    Hodgkin's lymphoma Maternal Grandmother    Cancer Maternal Grandfather        prostate   Obesity Other    Allergic rhinitis Neg Hx    Asthma Neg Hx    Eczema Neg Hx    Urticaria Neg Hx     Social:  reports that she has never smoked. She has never used smokeless tobacco. She reports current alcohol use. She reports that she does not use drugs.  Allergies:  Allergies  Allergen Reactions   Oxaliplatin Shortness Of Breath    Sweating    Wellbutrin [Bupropion] Itching and Other (See Comments)    Facial redness   Iohexol Swelling    Patient had swelling and itching to face in 1980's. Had 13 hour pre meds before scan  10/27/17   Iodine Swelling   Sulfa Antibiotics Itching and Other (See Comments)    Medications: I have reviewed the patient's current medications.  Results for orders placed or performed during the hospital encounter of 09/12/21 (from the past 48 hour(s))  I-STAT, chem 8     Status: None   Collection Time: 09/12/21  7:13 AM  Result Value Ref Range   Sodium 137 135 - 145 mmol/L   Potassium 3.7 3.5 - 5.1 mmol/L   Chloride 101 98 - 111 mmol/L   BUN 12 8 - 23 mg/dL   Creatinine, Ser 0.60 0.44 - 1.00 mg/dL   Glucose, Bld 99 70 - 99 mg/dL    Comment: Glucose reference range applies only to samples taken after fasting for at least 8 hours.   Calcium, Ion 1.35 1.15 - 1.40 mmol/L   TCO2 26 22 - 32 mmol/L   Hemoglobin 14.3 12.0 - 15.0 g/dL   HCT 42.0 36.0 - 46.0 %    No  results found.  ROS - all of the below systems have been reviewed with the patient and positives are indicated with bold text General: chills, fever or night sweats Eyes: blurry vision or double vision ENT: epistaxis or sore throat Allergy/Immunology: itchy/watery eyes or nasal congestion Hematologic/Lymphatic: bleeding problems, blood clots or swollen lymph nodes Endocrine: temperature intolerance or unexpected weight changes Breast: new or changing breast lumps or nipple discharge Resp: cough, shortness of breath, or wheezing CV: chest pain or dyspnea on exertion GI: as per HPI GU: dysuria, trouble voiding, or hematuria MSK: joint pain or joint stiffness Neuro: TIA or stroke symptoms Derm: pruritus and skin lesion changes Psych: anxiety and depression  PE Blood pressure (!) 166/87, pulse 67, temperature 98.5 F (36.9 C), temperature source Oral, resp. rate 18, height 5\' 3"  (1.6 m), weight 108.1 kg, last menstrual period 07/10/2001, SpO2 99 %. Constitutional: NAD; conversant; no deformities Eyes: Moist conjunctiva; no lid lag; anicteric; PERRL Neck: Trachea midline; no thyromegaly Lungs: Normal respiratory effort; no tactile fremitus CV: RRR; no palpable thrills; no pitting edema GI: Abd soft, NT/ND; no palpable hepatosplenomegaly MSK: Normal range of motion of extremities; no clubbing/cyanosis Psychiatric: Appropriate affect; alert and oriented x3 Lymphatic: No palpable cervical or axillary lymphadenopathy  Results for orders placed or performed during the hospital encounter of 09/12/21 (from the past 48 hour(s))  I-STAT, chem 8     Status: None   Collection Time: 09/12/21  7:13 AM  Result Value Ref Range   Sodium 137 135 - 145 mmol/L   Potassium 3.7 3.5 - 5.1 mmol/L   Chloride 101 98 - 111 mmol/L   BUN 12 8 - 23 mg/dL   Creatinine, Ser 0.60 0.44 - 1.00 mg/dL   Glucose, Bld 99 70 - 99 mg/dL    Comment: Glucose reference range applies only to samples taken after fasting for  at least 8 hours.   Calcium, Ion 1.35 1.15 - 1.40 mmol/L   TCO2 26 22 - 32 mmol/L   Hemoglobin 14.3 12.0 - 15.0 g/dL   HCT 42.0 36.0 - 46.0 %    No results found.    A/P: Sharon Allison is an 66 y.o. female with hx of stage III colon cancer - completed adjuvant chemotherapy and we have been asked to remove her port-a-cath as it is no longer necessary  -We have reviewed removal of port-a-cath today -The planned procedure, material risks (including, but not limited to, pain, bleeding, infection, scarring, need for blood transfusion,  hematoma, seroma, worsening of pre-existing medical conditions, heart attack, stroke, death) benefits and alternatives to surgery were discussed at length. [name] questions were answered to her satisfaction, she voiced understanding and elected to proceed with surgery. Additionally, we discussed typical postoperative expectations and the recovery process.  Nadeen Landau, MD [facility]

## 2021-09-12 NOTE — Op Note (Signed)
09/12/2021  9:06 AM  PATIENT:  Sharon Allison  67 y.o. female  Patient Care Team: Saguier, Iris Pert as PCP - General (Internal Medicine) Juanita Craver, MD as Consulting Physician (Gastroenterology)  PRE-OPERATIVE DIAGNOSIS:  History of colon cancer, stage III; indwelling port-a-cath  POST-OPERATIVE DIAGNOSIS:  Same  PROCEDURE:  Removal of right chest wall tunneled port-a-catheter  SURGEON:  Sharon Mt. Dema Severin, MD  ASSISTANT: Radonna Ricker, MD  ANESTHESIA:   local and MAC  COUNTS:  Sponge, needle and instrument counts were reported correct x2 at the conclusion of the operation.  EBL: 1 mL  DRAINS: None  SPECIMEN: None  COMPLICATIONS: None  FINDINGS: Tunneled right internal jugular vein portacatheter removed uneventfully.  DISPOSITION: PACU in satisfactory condition  DESCRIPTION: The patient was identified in preop holding and taken to the OR where she was placed on the operating room table. SCDs were placed. MAC anesthesia was induced without difficulty. She was then prepped and draped in the usual sterile fashion. A surgical timeout was performed indicating the correct patient, procedure, positioning and need for preoperative antibiotics.   The right chest wall portacatheter site was identified.  There is a palpable portacatheter beneath the skin.  Local anesthetic is infiltrated into the subcutaneous tissues.  After ascertaining an appropriate level of anesthesia, an incision is made incorporating the old scar.  Subcutaneous tissue was then divided using electrocautery.  The portacatheter capsule was identified and incised sharply.  The portacatheter is then grasped with a penetrating towel clamp.  The 3 Prolene sutures securing the portacatheter circumferentially are cut and removed.  Our anesthetist then applied pressure at the portacatheter insertion site in the right internal jugular vein.  The portacatheter and associated tubing was then removed.  This was inspected  and noted to be intact without any apparent fractures of the device or any apparent clot on the catheter.  This was passed off.  The pocket is then irrigated and hemostasis verified.  The tunneled site is closed using a 3-0 Vicryl figure-of-eight suture.  The deep dermal layer was then closed using 3-0 Vicryl suture.  A running 4-0 Monocryl subcuticular stitch is used to close the skin.  All sponge, needle, and instrument counts were reported correct.  The wound is then washed, dried, and Dermabond applied.  She is then awakened from anesthesia and transferred to a stretcher for transport to PACU in satisfactory condition.

## 2021-09-12 NOTE — Discharge Instructions (Addendum)
POST OP INSTRUCTIONS  DIET: As tolerated. Follow a light bland diet the first 24 hours after arrival home, such as soup, liquids, crackers, etc.  Be sure to include lots of fluids daily.  Avoid fast food or heavy meals as your are more likely to get nauseated.  Eat a low fat the next few days after surgery.  Take your usually prescribed home medications unless otherwise directed.  PAIN CONTROL: Pain is best controlled by a usual combination of three different methods TOGETHER: Ice/Heat Over the counter pain medication Prescription pain medication Most patients will experience some swelling and bruising around the surgical site.  Ice packs or heating pads (30-60 minutes up to 6 times a day) will help. Some people prefer to use ice alone, heat alone, alternating between ice & heat.  Experiment to what works for you.  Swelling and bruising can take several weeks to resolve.   It is helpful to take an over-the-counter pain medication regularly for the first few weeks: Ibuprofen (Motrin/Advil) - 200mg tabs - take 3 tabs (600mg) every 6 hours as needed for pain Acetaminophen (Tylenol) - you may take 650mg every 6 hours as needed. You can take this with motrin as they act differently on the body. If you are taking a narcotic pain medication that has acetaminophen in it, do not take over the counter tylenol at the same time.  Iii. NOTE: You may take both of these medications together - most patients  find it most helpful when alternating between the two (i.e. Ibuprofen at 6am,  tylenol at 9am, ibuprofen at 12pm ...) A  prescription for pain medication should be given to you upon discharge.  Take your pain medication as prescribed if your pain is not adequatly controlled with the over-the-counter pain reliefs mentioned above.  Avoid getting constipated.  Between the surgery and the pain medications, it is common to experience some constipation.  Increasing fluid intake and taking a fiber supplement (such as  Metamucil, Citrucel, FiberCon, MiraLax, etc) 1-2 times a day regularly will usually help prevent this problem from occurring.  A mild laxative (prune juice, Milk of Magnesia, MiraLax, etc) should be taken according to package directions if there are no bowel movements after 48 hours.    Dressing: Your incision is covered in Dermabond which is like sterile superglue for the skin. This will come off on it's own in a couple weeks. It is waterproof and you may bathe normally starting the day after your surgery in a shower. Avoid baths/pools/lakes/oceans until your wounds have fully healed.  ACTIVITIES as tolerated:   Avoid heavy lifting (>10lbs or 1 gallon of milk) for the next 6 weeks. You may resume regular (light) daily activities beginning the next day--such as daily self-care, walking, climbing stairs--gradually increasing activities as tolerated.  If you can walk 30 minutes without difficulty, it is safe to try more intense activity such as jogging, treadmill, bicycling, low-impact aerobics.  DO NOT PUSH THROUGH PAIN.  Let pain be your guide: If it hurts to do something, don't do it. You may drive when you are no longer taking prescription pain medication, you can comfortably wear a seatbelt, and you can safely maneuver your car and apply brakes.   FOLLOW UP in our office Please call [facility] at [phone] to set up an appointment to see your surgeon in the office for a follow-up appointment approximately 2 weeks after your surgery. Make sure that you call for this appointment the day you arrive home to   insure a convenient appointment time.  9. If you have disability or family leave forms that need to be completed, you may have them completed by your primary care physician's office; for return to work instructions, please ask our office staff and they will be happy to assist you in obtaining this documentation   When to call us [phone]: Poor pain control Reactions / problems with new  medications (rash/itching, etc)  Fever over 101.5 F (38.5 C) Inability to urinate Nausea/vomiting Worsening swelling or bruising Continued bleeding from incision. Increased pain, redness, or drainage from the incision  The clinic staff is available to answer your questions during regular business hours (8:30am-5pm).  Please dont hesitate to call and ask to speak to one of our nurses for clinical concerns.   A surgeon from [facility] is always on call at the hospitals   If you have a medical emergency, go to the nearest emergency room or call 911.  [facility] [address] [place], [place], [place]  [address] MAIN: [phone] FAX: [phone] [email or web address]    Post Anesthesia Home Care Instructions  Activity: Get plenty of rest for the remainder of the day. A responsible individual must stay with you for 24 hours following the procedure.  For the next 24 hours, DO NOT: -Drive a car -Paediatric nurse -Drink alcoholic beverages -Take any medication unless instructed by your physician -Make any legal decisions or sign important papers.  Meals: Start with liquid foods such as gelatin or soup. Progress to regular foods as tolerated. Avoid greasy, spicy, heavy foods. If nausea and/or vomiting occur, drink only clear liquids until the nausea and/or vomiting subsides. Call your physician if vomiting continues.  Special Instructions/Symptoms: Your throat may feel dry or sore from the anesthesia or the breathing tube placed in your throat during surgery. If this causes discomfort, gargle with warm salt water. The discomfort should disappear within 24 hours.   Do not take any Tylenol until after 1:30 pm today.

## 2021-09-12 NOTE — Anesthesia Postprocedure Evaluation (Signed)
Anesthesia Post Note  Patient: Sharon Allison  Procedure(s) Performed: REMOVAL PORT-A-CATH (Chest)     Patient location during evaluation: PACU Anesthesia Type: MAC Level of consciousness: awake and alert and oriented Pain management: pain level controlled Vital Signs Assessment: post-procedure vital signs reviewed and stable Respiratory status: spontaneous breathing, nonlabored ventilation and respiratory function stable Cardiovascular status: blood pressure returned to baseline Postop Assessment: no apparent nausea or vomiting Anesthetic complications: no   No notable events documented.  Last Vitals:  Vitals:   09/12/21 0924 09/12/21 1001  BP: (!) 152/82 (!) 142/92  Pulse: (!) 59 (!) 57  Resp: 14 18  Temp:  (!) 36.3 C  SpO2: 98% 100%    Last Pain:  Vitals:   09/12/21 1001  TempSrc: Oral  PainSc: 0-No pain   Pain Goal: Patients Stated Pain Goal: 5 (09/12/21 1001)                 Marthenia Rolling

## 2021-09-12 NOTE — Transfer of Care (Signed)
Immediate Anesthesia Transfer of Care Note  Patient: Sharon Allison  Procedure(s) Performed: Procedure(s) (LRB): REMOVAL PORT-A-CATH (N/A)  Patient Location: PACU  Anesthesia Type: MAC  Level of Consciousness: awake, alert , oriented and patient cooperative  Airway & Oxygen Therapy: Patient Spontanous Breathing on room air  Post-op Assessment: Report given to PACU RN and Post -op Vital signs reviewed and stable  Post vital signs: Reviewed and stable  Complications: No apparent anesthesia complications Vitals Value Taken Time  BP 141/71 09/12/21 0900  Temp 36.4 C 09/12/21 0855  Pulse 66 09/12/21 0905  Resp 17 09/12/21 0905  SpO2 95 % 09/12/21 0905  Vitals shown include unvalidated device data.  Last Pain:  Vitals:   09/12/21 0855  TempSrc:   PainSc: 0-No pain      Patients Stated Pain Goal: 5 (34/91/79 1505)  Complications: No notable events documented.

## 2021-09-13 ENCOUNTER — Encounter (HOSPITAL_BASED_OUTPATIENT_CLINIC_OR_DEPARTMENT_OTHER): Payer: Self-pay | Admitting: Surgery

## 2021-09-17 ENCOUNTER — Inpatient Hospital Stay: Payer: Medicare PPO

## 2021-09-17 ENCOUNTER — Inpatient Hospital Stay: Payer: Medicare PPO | Attending: Oncology | Admitting: Oncology

## 2021-09-17 ENCOUNTER — Other Ambulatory Visit: Payer: Medicare PPO

## 2021-09-17 ENCOUNTER — Inpatient Hospital Stay: Payer: Medicare PPO | Admitting: Oncology

## 2021-09-17 ENCOUNTER — Telehealth: Payer: Self-pay

## 2021-09-17 DIAGNOSIS — Z9049 Acquired absence of other specified parts of digestive tract: Secondary | ICD-10-CM | POA: Insufficient documentation

## 2021-09-17 DIAGNOSIS — D6959 Other secondary thrombocytopenia: Secondary | ICD-10-CM | POA: Insufficient documentation

## 2021-09-17 DIAGNOSIS — R202 Paresthesia of skin: Secondary | ICD-10-CM | POA: Insufficient documentation

## 2021-09-17 DIAGNOSIS — Z79899 Other long term (current) drug therapy: Secondary | ICD-10-CM | POA: Insufficient documentation

## 2021-09-17 DIAGNOSIS — K760 Fatty (change of) liver, not elsewhere classified: Secondary | ICD-10-CM | POA: Insufficient documentation

## 2021-09-17 DIAGNOSIS — J45909 Unspecified asthma, uncomplicated: Secondary | ICD-10-CM | POA: Insufficient documentation

## 2021-09-17 DIAGNOSIS — T451X5A Adverse effect of antineoplastic and immunosuppressive drugs, initial encounter: Secondary | ICD-10-CM | POA: Insufficient documentation

## 2021-09-17 DIAGNOSIS — D123 Benign neoplasm of transverse colon: Secondary | ICD-10-CM | POA: Insufficient documentation

## 2021-09-17 DIAGNOSIS — R200 Anesthesia of skin: Secondary | ICD-10-CM | POA: Insufficient documentation

## 2021-09-17 DIAGNOSIS — C182 Malignant neoplasm of ascending colon: Secondary | ICD-10-CM | POA: Insufficient documentation

## 2021-09-17 NOTE — Telephone Encounter (Signed)
Patient left a voice message. Called and spoke with patient she was wondering why the cancer center was calling her and she had an appointment at 230. There was a change of appointment schedule and patient don't realize the changing and she miss her 745 with lab and her 8 with Dr. Benay Spice. I transfer the call to scheduler to schedule a new appointment.

## 2021-09-26 DIAGNOSIS — Z08 Encounter for follow-up examination after completed treatment for malignant neoplasm: Secondary | ICD-10-CM | POA: Diagnosis not present

## 2021-09-26 DIAGNOSIS — Z85038 Personal history of other malignant neoplasm of large intestine: Secondary | ICD-10-CM | POA: Diagnosis not present

## 2021-09-26 DIAGNOSIS — C182 Malignant neoplasm of ascending colon: Secondary | ICD-10-CM | POA: Diagnosis not present

## 2021-09-27 ENCOUNTER — Inpatient Hospital Stay: Payer: Medicare PPO

## 2021-09-27 ENCOUNTER — Encounter: Payer: Self-pay | Admitting: Oncology

## 2021-09-27 ENCOUNTER — Other Ambulatory Visit: Payer: Self-pay

## 2021-09-27 ENCOUNTER — Inpatient Hospital Stay: Payer: Medicare PPO | Admitting: Oncology

## 2021-09-27 VITALS — BP 147/82 | HR 65 | Temp 97.8°F | Resp 18 | Ht 63.0 in | Wt 240.0 lb

## 2021-09-27 DIAGNOSIS — Z79899 Other long term (current) drug therapy: Secondary | ICD-10-CM | POA: Diagnosis not present

## 2021-09-27 DIAGNOSIS — T451X5A Adverse effect of antineoplastic and immunosuppressive drugs, initial encounter: Secondary | ICD-10-CM | POA: Diagnosis not present

## 2021-09-27 DIAGNOSIS — R200 Anesthesia of skin: Secondary | ICD-10-CM | POA: Diagnosis not present

## 2021-09-27 DIAGNOSIS — D123 Benign neoplasm of transverse colon: Secondary | ICD-10-CM | POA: Diagnosis not present

## 2021-09-27 DIAGNOSIS — Z9049 Acquired absence of other specified parts of digestive tract: Secondary | ICD-10-CM | POA: Diagnosis not present

## 2021-09-27 DIAGNOSIS — C182 Malignant neoplasm of ascending colon: Secondary | ICD-10-CM

## 2021-09-27 DIAGNOSIS — R202 Paresthesia of skin: Secondary | ICD-10-CM | POA: Diagnosis not present

## 2021-09-27 DIAGNOSIS — D6959 Other secondary thrombocytopenia: Secondary | ICD-10-CM | POA: Diagnosis not present

## 2021-09-27 DIAGNOSIS — J45909 Unspecified asthma, uncomplicated: Secondary | ICD-10-CM | POA: Diagnosis not present

## 2021-09-27 DIAGNOSIS — K760 Fatty (change of) liver, not elsewhere classified: Secondary | ICD-10-CM | POA: Diagnosis not present

## 2021-09-27 LAB — CEA (ACCESS): CEA (CHCC): 1.6 ng/mL (ref 0.00–5.00)

## 2021-09-27 NOTE — Progress Notes (Signed)
[facility] OFFICE PROGRESS NOTE   Diagnosis: Colon cancer  INTERVAL HISTORY:   Sharon Allison returns for a scheduled visit.  She underwent Port-A-Cath removal 09/12/2021.  She had a colonoscopy 09/07/2021. Good appetite and energy level.  No difficulty with bowel function.  No bleeding.  She has numbness and "tingling "in the toes at night.  No other neuropathy symptoms.  Objective:  Vital signs in last 24 hours:  Blood pressure (!) 147/82, pulse 65, temperature 97.8 F (36.6 C), temperature source Oral, resp. rate 18, height $RemoveBe'5\' 3"'xeVusabjg$  (1.6 m), weight 240 lb (108.9 kg), last menstrual period 07/10/2001, SpO2 100 %.     Lymphatics: No cervical, supraclavicular, axillary, or inguinal nodes Resp: Lungs clear bilaterally Cardio: Regular rate and rhythm GI: No mass, no hepatosplenomegaly, nontender Vascular: No leg edema   Lab Results:  Lab Results  Component Value Date   WBC 5.6 10/20/2020   HGB 14.3 09/12/2021   HCT 42.0 09/12/2021   MCV 91.7 10/20/2020   PLT 250.0 10/20/2020   NEUTROABS 2.9 10/20/2020    CMP  Lab Results  Component Value Date   NA 137 09/12/2021   K 3.7 09/12/2021   CL 101 09/12/2021   CO2 23 03/19/2021   GLUCOSE 99 09/12/2021   BUN 12 09/12/2021   CREATININE 0.60 09/12/2021   CALCIUM 10.2 03/19/2021   PROT 6.7 03/19/2021   ALBUMIN 4.3 03/19/2021   AST 16 03/19/2021   ALT 18 03/19/2021   ALKPHOS 99 03/19/2021   BILITOT 0.5 03/19/2021   GFRNONAA >60 08/02/2020   GFRAA >60 08/13/2019    Lab Results  Component Value Date   CEA1 1.09 01/22/2021   CEA 1.36 01/22/2021     Medications: I have reviewed the patient's current medications.   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 MSI-stable 3/14 lymph nodes positive for metastatic carcinoma, lymphovascular invasion present Cycle 1 FOLFOX 10/22/2017 CTs chest, abdomen, and pelvis 10/28/2017- negative for  metastatic disease Cycle 2 FOLFOX 11/05/2017 Cycle 3 FOLFOX 11/27/2017 Cycle 4 FOLFOX 12/10/2017 Cycle 5 FOLFOX 12/24/2017 Cycle 6 FOLFOX 01/08/2018 (Oxaliplatin held due to thrombocytopenia) Cycle 7 FOLFOX 01/21/2018-oxaliplatin resumed Cycle 8 FOLFOX 02/04/2018-Oxaliplatin eliminated due to an allergic reaction with cycle 7 Cycle 9 FOLFOX 03/04/2018- no Oxaliplatin; 5-FU bolus eliminated, infusional 5-FU dose reduced Cycle 10 FOLFOX 03/17/2018 Cycle 11 FOLFOX 03/31/2018 Cycle 12 FOLFOX 04/14/2018 Surveillance colonoscopy 06/30/2018-tubular adenoma removed from the transverse colon CTs 08/25/2018-negative CTs 08/13/2019-no evidence of recurrent colon cancer, focal fatty change in the left liver CTs 08/02/2020-no evidence of recurrent disease Colonoscopy 09/07/2021-negative   2.   Asthma   3.   Port-A-Cath placement 10/17/2017   4.   History of neutropenia secondary to chemotherapy-resolved   5.   History of thrombocytopenia secondary to chemotherapy   6.   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   Diarrhea following cycle 8 FOLFOX-resolved, 5-FU bolus eliminated and infusion dose decreased beginning with cycle 9    Disposition: Sharon Allison is in remission from colon cancer.  We will follow-up on the CEA from today.  She will return for an office visit and CEA in 6 months.  She will continue colonoscopy surveillance with Dr. Deatra Ina.  She does not wish to begin medical therapy for the foot numbness/tingling.  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  Betsy Coder, MD  09/27/2021  8:18 AM

## 2021-10-01 ENCOUNTER — Other Ambulatory Visit: Payer: Self-pay | Admitting: Surgery

## 2021-10-01 DIAGNOSIS — Z85038 Personal history of other malignant neoplasm of large intestine: Secondary | ICD-10-CM

## 2021-10-01 DIAGNOSIS — C182 Malignant neoplasm of ascending colon: Secondary | ICD-10-CM

## 2021-11-20 DIAGNOSIS — H5213 Myopia, bilateral: Secondary | ICD-10-CM | POA: Diagnosis not present

## 2021-11-20 DIAGNOSIS — H04123 Dry eye syndrome of bilateral lacrimal glands: Secondary | ICD-10-CM | POA: Diagnosis not present

## 2021-11-20 DIAGNOSIS — H10413 Chronic giant papillary conjunctivitis, bilateral: Secondary | ICD-10-CM | POA: Diagnosis not present

## 2021-11-20 DIAGNOSIS — H25813 Combined forms of age-related cataract, bilateral: Secondary | ICD-10-CM | POA: Diagnosis not present

## 2022-02-13 ENCOUNTER — Telehealth: Payer: Self-pay

## 2022-02-13 MED ORDER — DIPHENHYDRAMINE HCL 50 MG PO TABS: 50.0000 mg | ORAL_TABLET | Freq: Once | ORAL | 0 refills | Status: DC

## 2022-02-13 MED ORDER — PREDNISONE 50 MG PO TABS: ORAL_TABLET | ORAL | 0 refills | Status: DC

## 2022-02-13 NOTE — Telephone Encounter (Signed)
Phone call to patient to review instructions for 13 hr prep for CT w/ contrast on 02/21/22 at 9:40 AM. Prescription called into [facility]. Pt aware and verbalized understanding of instructions.  Prescription: Pt to take 50 mg of prednisone on 02/20/22 at 8:40 PM, 50 mg of prednisone on 02/21/22 at 2:40 AM, and 50 mg of prednisone on 02/21/22 at 8:40 AM. Pt is also to take 50 mg of benadryl on 02/21/22 at 8:40 AM. Please call [phone] with any questions.    Benadryl also sent in as a prescription, per the pts request. Pt advised not to drive the day of taking this medication as it may cause drowsiness. Pt verbalized understanding.

## 2022-02-18 ENCOUNTER — Ambulatory Visit: Payer: Medicare PPO | Admitting: Allergy

## 2022-02-21 ENCOUNTER — Ambulatory Visit
Admission: RE | Admit: 2022-02-21 | Discharge: 2022-02-21 | Disposition: A | Discharge status: Home / Self Care | Payer: Medicare PPO | Source: Ambulatory Visit | Attending: Surgery | Admitting: Surgery

## 2022-02-21 DIAGNOSIS — Z08 Encounter for follow-up examination after completed treatment for malignant neoplasm: Secondary | ICD-10-CM

## 2022-02-21 DIAGNOSIS — Z9049 Acquired absence of other specified parts of digestive tract: Secondary | ICD-10-CM | POA: Diagnosis not present

## 2022-02-21 DIAGNOSIS — I700 Atherosclerosis of aorta: Secondary | ICD-10-CM | POA: Diagnosis not present

## 2022-02-21 DIAGNOSIS — C182 Malignant neoplasm of ascending colon: Secondary | ICD-10-CM

## 2022-02-21 DIAGNOSIS — R160 Hepatomegaly, not elsewhere classified: Secondary | ICD-10-CM | POA: Diagnosis not present

## 2022-02-21 DIAGNOSIS — N200 Calculus of kidney: Secondary | ICD-10-CM | POA: Diagnosis not present

## 2022-02-21 DIAGNOSIS — C189 Malignant neoplasm of colon, unspecified: Secondary | ICD-10-CM | POA: Diagnosis not present

## 2022-02-21 DIAGNOSIS — K6389 Other specified diseases of intestine: Secondary | ICD-10-CM | POA: Diagnosis not present

## 2022-02-21 MED ORDER — IOPAMIDOL (ISOVUE-300) INJECTION 61%
100.0000 mL | Freq: Once | INTRAVENOUS | Status: AC | PRN
  Administered 2022-02-21: 100 mL via INTRAVENOUS

## 2022-03-18 ENCOUNTER — Ambulatory Visit (INDEPENDENT_AMBULATORY_CARE_PROVIDER_SITE_OTHER): Payer: Medicare PPO | Admitting: Allergy

## 2022-03-18 ENCOUNTER — Encounter: Payer: Self-pay | Admitting: Allergy

## 2022-03-18 VITALS — BP 132/76 | HR 69 | Temp 98.2°F | Resp 16 | Ht 63.0 in | Wt 240.5 lb

## 2022-03-18 DIAGNOSIS — J310 Chronic rhinitis: Secondary | ICD-10-CM | POA: Diagnosis not present

## 2022-03-18 DIAGNOSIS — J4540 Moderate persistent asthma, uncomplicated: Secondary | ICD-10-CM

## 2022-03-18 MED ORDER — FLUTICASONE PROPIONATE 50 MCG/ACT NA SUSP: 1.0000 | Freq: Every day | NASAL | 5 refills | Status: DC | PRN

## 2022-03-18 MED ORDER — BUDESONIDE-FORMOTEROL FUMARATE 160-4.5 MCG/ACT IN AERO: 2.0000 | INHALATION_SPRAY | Freq: Two times a day (BID) | RESPIRATORY_TRACT | 3 refills | Status: DC

## 2022-03-18 MED ORDER — LEVOCETIRIZINE DIHYDROCHLORIDE 5 MG PO TABS
5.0000 mg | ORAL_TABLET | ORAL | 2 refills | Status: DC | PRN
Start: 2022-03-18 — End: 2022-12-19

## 2022-03-18 MED ORDER — MONTELUKAST SODIUM 10 MG PO TABS: 10.0000 mg | ORAL_TABLET | Freq: Every day | ORAL | 2 refills | Status: DC

## 2022-03-18 NOTE — Patient Instructions (Addendum)
Asthma: Daily controller medication(s): Decrease Symbicort 135mg to ONE puff once a day. If you notice any symptoms then go back up to 2 puffs once a day.  Prior to physical activity: May use albuterol rescue inhaler 2 puffs 5 to 15 minutes prior to strenuous physical activities. Rescue medications: May use albuterol rescue inhaler 2 puffs or nebulizer every 4 to 6 hours as needed for shortness of breath, chest tightness, coughing, and wheezing. Monitor frequency of use.  During upper respiratory infections/asthma flares: Start Singulair '10mg'$  daily.  Increase Symbicort 1661m to 2 puffs twice a day for 1-2 weeks. Asthma control goals:  Full participation in all desired activities (may need albuterol before activity) Albuterol use two times or less a week on average (not counting use with activity) Cough interfering with sleep two times or less a month Oral steroids no more than once a year No hospitalizations  Rhinitis  Use over the counter antihistamines such as Zyrtec (cetirizine), Claritin (loratadine), Allegra (fexofenadine), or Xyzal (levocetirizine) daily as needed. May switch antihistamines every few months. May use Flonase 1-2 sprays per nostril daily for nasal congestion.  Dry sinuses Use a cool mist humidifier during the winter months. May use saline gel in the nose as needed.   Follow up in 6 months or sooner if needed.  Get flu vaccine in the fall.

## 2022-03-18 NOTE — Assessment & Plan Note (Signed)
Some nasal congestion.   Use over the counter antihistamines such as Zyrtec (cetirizine), Claritin (loratadine), Allegra (fexofenadine), or Xyzal (levocetirizine) daily as needed. May switch antihistamines every few months.  May use Flonase 1-2 sprays per nostril daily for nasal congestion.  For the dry sinuses:  Use a cool mist humidifier during the winter months.  May use saline gel in the nose as needed.

## 2022-03-18 NOTE — Progress Notes (Signed)
Follow Up Note  RE: Sharon Allison MRN: [id number] DOB: 1956/07/19 Date of Office Visit: 03/18/2022  Referring provider: Elise Benne Primary care provider: Mackie Pai, PA-C  Chief Complaint: Follow-up  History of Present Illness: I had the pleasure of seeing Sharon Allison for a follow up visit at the [facility] on 03/18/2022. She is a 66 y.o. female, who is being followed for asthma, chronic rhinitis. Her previous allergy office visit was on 08/06/2021 with Dr. Maudie Mercury. Today is a regular follow up visit.  Moderate persistent asthma Insurance won't cover Advair. Currently on Symbicort 147mg 2 puffs once a day.  Some dyspnea on exertion.   Denies any SOB, coughing, wheezing, chest tightness, nocturnal awakenings, ER/urgent care visits since the last visit. Took prednisone for CT contrast premedication.  Patient is in remission for her colon cancer.   Chronic rhinitis Some nasal congestion.  Currently taking Xyzal and Singulair daily prn.  Using saline gel as needed.  No eye drop use.   Assessment and Plan: WTeliais a 66y.o. female with: Moderate persistent asthma without complication Controlled with below regimen. Some DOE. ICommunity education officer  Today's spirometry was normal.  Daily controller medication(s): Decrease Symbicort 1651m to ONE puff once a day. If you notice any symptoms then go back up to 2 puffs once a day.  Prior to physical activity: May use albuterol rescue inhaler 2 puffs 5 to 15 minutes prior to strenuous physical activities. Rescue medications: May use albuterol rescue inhaler 2 puffs or nebulizer every 4 to 6 hours as needed for shortness of breath, chest tightness, coughing, and wheezing. Monitor frequency of use.  During upper respiratory infections/asthma flares: Start Singulair '10mg'$  daily.  Increase Symbicort 16074mto 2 puffs twice a day for 1-2 weeks. Get spirometry at next visit. Get flu shot in the  fall.   Chronic rhinitis Some nasal congestion.  Use over the counter antihistamines such as Zyrtec (cetirizine), Claritin (loratadine), Allegra (fexofenadine), or Xyzal (levocetirizine) daily as needed. May switch antihistamines every few months. May use Flonase 1-2 sprays per nostril daily for nasal congestion. For the dry sinuses: Use a cool mist humidifier during the winter months. May use saline gel in the nose as needed.   Return in about 6 months (around 09/18/2022).  Meds ordered this encounter  Medications   montelukast (SINGULAIR) 10 MG tablet    Sig: Take 1 tablet (10 mg total) by mouth at bedtime.    Dispense:  90 tablet    Refill:  2   levocetirizine (XYZAL) 5 MG tablet    Sig: Take 1 tablet (5 mg total) by mouth as needed for allergies.    Dispense:  90 tablet    Refill:  2   budesonide-formoterol (SYMBICORT) 160-4.5 MCG/ACT inhaler    Sig: Inhale 2 puffs into the lungs in the morning and at bedtime. with spacer and rinse mouth afterwards.    Dispense:  1 each    Refill:  3   fluticasone (FLONASE) 50 MCG/ACT nasal spray    Sig: Place 1-2 sprays into both nostrils daily as needed (nasal congestion).    Dispense:  16 g    Refill:  5   Lab Orders  No laboratory test(s) ordered today    Diagnostics: Spirometry:  Tracings reviewed. Her effort: Good reproducible efforts. FVC: 1.85L FEV1: 1.47L, 76% predicted FEV1/FVC ratio: 79% Interpretation: Spirometry consistent with normal pattern.  Please see scanned spirometry results for details.  Medication  List:  Current Outpatient Medications  Medication Sig Dispense Refill   albuterol (VENTOLIN HFA) 108 (90 Base) MCG/ACT inhaler Inhale 2 puffs into the lungs every 4 (four) hours as needed for wheezing or shortness of breath. 18 g 1   atorvastatin (LIPITOR) 10 MG tablet Take 1 tablet (10 mg total) by mouth daily. (Patient taking differently: Take 10 mg by mouth at bedtime.) 90 tablet 0   budesonide-formoterol  (SYMBICORT) 160-4.5 MCG/ACT inhaler Inhale 2 puffs into the lungs in the morning and at bedtime. with spacer and rinse mouth afterwards. 1 each 3   Cholecalciferol (VITAMIN D3) 1000 units CAPS Take 1,000 Units by mouth daily.      CLENPIQ 10-3.5-12 MG-GM -GM/160ML SOLN See admin instructions.     fluticasone (FLONASE) 50 MCG/ACT nasal spray Place 1-2 sprays into both nostrils daily as needed (nasal congestion). 16 g 5   lidocaine-prilocaine (EMLA) cream Apply 1 application  topically as needed (prior to port flush).     Multiple Vitamin (MULTIVITAMIN WITH MINERALS) TABS tablet Take 1 tablet by mouth daily.     diphenhydrAMINE (BENADRYL) 50 MG tablet Take 1 tablet (50 mg total) by mouth once for 1 dose. Pt to take 50 mg of benadryl on 02/21/22 at 8:40 AM. Please call [phone] with any questions. 1 tablet 0   levocetirizine (XYZAL) 5 MG tablet Take 1 tablet (5 mg total) by mouth as needed for allergies. 90 tablet 2   montelukast (SINGULAIR) 10 MG tablet Take 1 tablet (10 mg total) by mouth at bedtime. 90 tablet 2   No current facility-administered medications for this visit.   Facility-Administered Medications Ordered in Other Visits  Medication Dose Route Frequency Provider Last Rate Last Admin   famotidine (PEPCID) IVPB 20 mg premix  20 mg Intravenous Q12H Tanner, Lucianne Lei E., PA-C   Stopped at 01/21/18 1528   heparin lock flush 100 unit/mL  500 Units Intracatheter Once PRN Ladell Pier, MD       sodium chloride flush (NS) 0.9 % injection 10 mL  10 mL Intracatheter PRN Ladell Pier, MD       Allergies: Allergies  Allergen Reactions   Oxaliplatin Shortness Of Breath    Sweating    Wellbutrin [Bupropion] Itching and Other (See Comments)    Facial redness   Iohexol Swelling    Patient had swelling and itching to face in 1980's. Had 13 hour pre meds before scan 10/27/17   Iodine Swelling   Sulfa Antibiotics Itching and Other (See Comments)   I reviewed her past medical history, social  history, family history, and environmental history and no significant changes have been reported from her previous visit.  Review of Systems  Constitutional:  Negative for appetite change, chills, fever and unexpected weight change.  HENT:  Positive for congestion. Negative for postnasal drip and rhinorrhea.   Eyes:  Negative for itching.  Respiratory:  Negative for cough, chest tightness, shortness of breath and wheezing.   Gastrointestinal:  Negative for abdominal pain.  Skin:  Negative for rash.  Neurological:  Negative for headaches.    Objective: BP 132/76   Pulse 69   Temp 98.2 F (36.8 C)   Resp 16   Ht '5\' 3"'$  (1.6 m)   Wt 240 lb 8 oz (109.1 kg)   LMP 07/10/2001 (LMP Unknown)   SpO2 96%   BMI 42.60 kg/m  Body mass index is 42.6 kg/m. Physical Exam Vitals and nursing note reviewed.  Constitutional:  Appearance: Normal appearance. She is well-developed.  HENT:     Head: Normocephalic and atraumatic.     Right Ear: External ear normal.     Left Ear: External ear normal.     Nose: Nose normal.     Mouth/Throat:     Mouth: Mucous membranes are moist.     Pharynx: Oropharynx is clear.  Eyes:     Conjunctiva/sclera: Conjunctivae normal.  Cardiovascular:     Rate and Rhythm: Normal rate and regular rhythm.     Heart sounds: Normal heart sounds. No murmur heard.    No friction rub. No gallop.  Pulmonary:     Effort: Pulmonary effort is normal.     Breath sounds: Normal breath sounds. No wheezing or rales.  Musculoskeletal:     Cervical back: Neck supple.  Skin:    General: Skin is warm.     Findings: No rash.  Neurological:     Mental Status: She is alert and oriented to person, place, and time.  Psychiatric:        Behavior: Behavior normal.   Previous notes and tests were reviewed. The plan was reviewed with the patient/family, and all questions/concerned were addressed.  It was my pleasure to see Sharon Allison today and participate in her care. Please feel free  to contact me with any questions or concerns.  Sincerely,  Rexene Alberts, DO Allergy & Immunology  [facility] office: [place] office: [phone]

## 2022-03-18 NOTE — Assessment & Plan Note (Signed)
Controlled with below regimen. Some DOE. Community education officer.  . 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  . Daily controller medication(s): Decrease Symbicort 163mg to ONE puff once a day. o If you notice any symptoms then go back up to 2 puffs once a day.  . Prior to physical activity: May use albuterol rescue inhaler 2 puffs 5 to 15 minutes prior to strenuous physical activities. .Marland KitchenRescue medications: May use albuterol rescue inhaler 2 puffs or nebulizer every 4 to 6 hours as needed for shortness of breath, chest tightness, coughing, and wheezing. Monitor frequency of use.  . During upper respiratory infections/asthma flares: Start Singulair '10mg'$  daily.  o Increase Symbicort 1659m to 2 puffs twice a day for 1-2 weeks. . Get spirometry at next visit. . Get flu shot in the fall.

## 2022-03-20 ENCOUNTER — Telehealth: Payer: Self-pay

## 2022-03-20 ENCOUNTER — Telehealth: Payer: Self-pay | Admitting: Allergy

## 2022-03-20 MED ORDER — FLUTICASONE-SALMETEROL 100-50 MCG/ACT IN AEPB
1.0000 | INHALATION_SPRAY | Freq: Every day | RESPIRATORY_TRACT | 5 refills | Status: DC
Start: 2022-03-20 — End: 2022-10-09

## 2022-03-20 NOTE — Telephone Encounter (Signed)
Will send in Advair Diskus 194mg one puff once a day and rinse mouth after each use.  Sending in lower dose as we are trying wean the inhalers.   If symptoms with 1 puff once a day then take 1 puff twice a day.

## 2022-03-20 NOTE — Telephone Encounter (Signed)
Patient's insurance no longer covers symbicort generic or brand name. The pharmacy called to see which alternative the provider prefers to continue care.   Breo Ellipta Advair Diskus  [employer]

## 2022-03-20 NOTE — Telephone Encounter (Signed)
I contacted Sharon Allison or the patient's caregiver to inform them that she received an expired dose of Pfizer COVID-19 vaccine from our office, during their visit(s), on 01/31/21. The dose was expired by 76 days. This was the patient's booster dose. I shared the following information with the patient or caregiver: vaccines given after they have expired may be less effective but we are not aware of any other adverse effects. The patient can be re-vaccinated at no cost if the patient decides to do so. Answered patient questions/concerns. Encouraged patient to reach out if they have any additional questions or concerns.  The patient decided to be re-vaccinated with no vaccine at this time since she has had another booster on 08/06/21.

## 2022-03-26 ENCOUNTER — Inpatient Hospital Stay: Payer: Medicare PPO | Attending: Oncology

## 2022-03-26 ENCOUNTER — Inpatient Hospital Stay: Payer: Medicare PPO | Admitting: Nurse Practitioner

## 2022-03-26 ENCOUNTER — Other Ambulatory Visit: Payer: Self-pay | Admitting: *Deleted

## 2022-03-26 ENCOUNTER — Encounter: Payer: Self-pay | Admitting: Nurse Practitioner

## 2022-03-26 VITALS — BP 149/83 | HR 84 | Temp 98.2°F | Resp 18 | Ht 63.0 in | Wt 241.0 lb

## 2022-03-26 DIAGNOSIS — C182 Malignant neoplasm of ascending colon: Secondary | ICD-10-CM | POA: Diagnosis not present

## 2022-03-26 DIAGNOSIS — R202 Paresthesia of skin: Secondary | ICD-10-CM | POA: Insufficient documentation

## 2022-03-26 DIAGNOSIS — R200 Anesthesia of skin: Secondary | ICD-10-CM | POA: Insufficient documentation

## 2022-03-26 DIAGNOSIS — C180 Malignant neoplasm of cecum: Secondary | ICD-10-CM | POA: Diagnosis not present

## 2022-03-26 LAB — CEA (ACCESS): CEA (CHCC): 1.59 ng/mL (ref 0.00–5.00)

## 2022-03-26 MED ORDER — SYMBICORT 160-4.5 MCG/ACT IN AERO: 2.0000 | INHALATION_SPRAY | Freq: Two times a day (BID) | RESPIRATORY_TRACT | 5 refills | Status: DC

## 2022-03-26 NOTE — Progress Notes (Signed)
  [facility] OFFICE PROGRESS NOTE   Diagnosis: Colon cancer  INTERVAL HISTORY:   Ms. Felling returns as scheduled.  No change in bowel habits.  No bleeding with bowel movements.  No abdominal pain.  Appetite described as "great".  She reports progressive numbness and tingling in the feet as compared to 6 months ago.  Objective:  Vital signs in last 24 hours:  Blood pressure (!) 149/83, pulse 84, temperature 98.2 F (36.8 C), temperature source Oral, resp. rate 18, height $RemoveBe'5\' 3"'[id number]$  (1.6 m), weight 241 lb (109.3 kg), last menstrual period 07/10/2001, SpO2 100 %.     Lymphatics: No palpable cervical, supraclavicular, axillary or inguinal lymph nodes. Resp: Lungs clear bilaterally. Cardio: Regular rate and rhythm. GI: Abdomen soft and nontender.  No hepatosplenomegaly.  No mass. Vascular: No leg edema.   Lab Results:  Lab Results  Component Value Date   WBC 5.6 10/20/2020   HGB 14.3 09/12/2021   HCT 42.0 09/12/2021   MCV 91.7 10/20/2020   PLT 250.0 10/20/2020   NEUTROABS 2.9 10/20/2020    Imaging:  No results found.  Medications: I have reviewed the patient's current medications.  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 MSI-stable 3/14 lymph nodes positive for metastatic carcinoma, lymphovascular invasion present Cycle 1 FOLFOX 10/22/2017 CTs chest, abdomen, and pelvis 10/28/2017- negative for metastatic disease Cycle 2 FOLFOX 11/05/2017 Cycle 3 FOLFOX 11/27/2017 Cycle 4 FOLFOX 12/10/2017 Cycle 5 FOLFOX 12/24/2017 Cycle 6 FOLFOX 01/08/2018 (Oxaliplatin held due to thrombocytopenia) Cycle 7 FOLFOX 01/21/2018-oxaliplatin resumed Cycle 8 FOLFOX 02/04/2018-Oxaliplatin eliminated due to an allergic reaction with cycle 7 Cycle 9 FOLFOX 03/04/2018- no Oxaliplatin; 5-FU bolus eliminated, infusional 5-FU dose reduced Cycle 10 FOLFOX 03/17/2018 Cycle 11 FOLFOX 03/31/2018 Cycle 12 FOLFOX 04/14/2018 Surveillance  colonoscopy 06/30/2018-tubular adenoma removed from the transverse colon CTs 08/25/2018-negative CTs 08/13/2019-no evidence of recurrent colon cancer, focal fatty change in the left liver CTs 08/02/2020-no evidence of recurrent disease Colonoscopy 09/07/2021-negative CTs 02/21/2022-no evidence of recurrent or metastatic disease in the chest, abdomen or pelvis.   2.   Asthma   3.   Port-A-Cath placement 10/17/2017   4.   History of neutropenia secondary to chemotherapy-resolved   5.   History of thrombocytopenia secondary to chemotherapy   6.   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   Diarrhea following cycle 8 FOLFOX-resolved, 5-FU bolus eliminated and infusion dose decreased beginning with cycle 9    Disposition: Sharon Allison remains in clinical remission from colon cancer.  We will follow-up on the CEA from today.  She has increased numbness/tingling in the feet.  This has occurred over the past 6 months.  Unlikely to be related to chemotherapy at this point.  We recommend she follow-up with her PCP to evaluate for other causes.  She will return for a CEA and follow-up visit in 6 months.    Ned Card ANP/GNP-BC   03/26/2022  1:23 PM

## 2022-03-27 ENCOUNTER — Other Ambulatory Visit: Payer: Medicare PPO

## 2022-03-27 ENCOUNTER — Ambulatory Visit: Payer: Medicare PPO | Admitting: Nurse Practitioner

## 2022-04-17 ENCOUNTER — Encounter (INDEPENDENT_AMBULATORY_CARE_PROVIDER_SITE_OTHER): Payer: Self-pay

## 2022-07-15 ENCOUNTER — Ambulatory Visit (INDEPENDENT_AMBULATORY_CARE_PROVIDER_SITE_OTHER): Payer: Medicare PPO

## 2022-07-15 DIAGNOSIS — Z23 Encounter for immunization: Secondary | ICD-10-CM | POA: Diagnosis not present

## 2022-08-07 ENCOUNTER — Other Ambulatory Visit: Payer: Self-pay | Admitting: Medical

## 2022-08-07 DIAGNOSIS — Z1231 Encounter for screening mammogram for malignant neoplasm of breast: Secondary | ICD-10-CM

## 2022-09-26 ENCOUNTER — Inpatient Hospital Stay: Payer: Medicare PPO | Admitting: Oncology

## 2022-09-26 ENCOUNTER — Inpatient Hospital Stay: Payer: Medicare PPO

## 2022-10-02 ENCOUNTER — Ambulatory Visit
Admission: RE | Admit: 2022-10-02 | Discharge: 2022-10-02 | Disposition: A | Discharge status: Home / Self Care | Payer: Medicare PPO | Source: Ambulatory Visit | Attending: Medical | Admitting: Medical

## 2022-10-02 DIAGNOSIS — Z1231 Encounter for screening mammogram for malignant neoplasm of breast: Secondary | ICD-10-CM | POA: Diagnosis not present

## 2022-10-09 ENCOUNTER — Encounter: Payer: Self-pay | Admitting: Oncology

## 2022-10-09 ENCOUNTER — Inpatient Hospital Stay: Payer: Medicare PPO | Attending: Oncology | Admitting: Oncology

## 2022-10-09 ENCOUNTER — Inpatient Hospital Stay: Payer: Medicare PPO

## 2022-10-09 ENCOUNTER — Other Ambulatory Visit (HOSPITAL_BASED_OUTPATIENT_CLINIC_OR_DEPARTMENT_OTHER): Payer: Self-pay

## 2022-10-09 VITALS — BP 141/80 | HR 64 | Temp 98.1°F | Resp 18 | Ht 63.0 in | Wt 249.2 lb

## 2022-10-09 DIAGNOSIS — R202 Paresthesia of skin: Secondary | ICD-10-CM | POA: Diagnosis not present

## 2022-10-09 DIAGNOSIS — R197 Diarrhea, unspecified: Secondary | ICD-10-CM | POA: Diagnosis not present

## 2022-10-09 DIAGNOSIS — C182 Malignant neoplasm of ascending colon: Secondary | ICD-10-CM

## 2022-10-09 DIAGNOSIS — T7840XA Allergy, unspecified, initial encounter: Secondary | ICD-10-CM | POA: Insufficient documentation

## 2022-10-09 DIAGNOSIS — J45909 Unspecified asthma, uncomplicated: Secondary | ICD-10-CM | POA: Insufficient documentation

## 2022-10-09 DIAGNOSIS — Z79899 Other long term (current) drug therapy: Secondary | ICD-10-CM | POA: Diagnosis not present

## 2022-10-09 DIAGNOSIS — R000 Tachycardia, unspecified: Secondary | ICD-10-CM | POA: Diagnosis not present

## 2022-10-09 DIAGNOSIS — R200 Anesthesia of skin: Secondary | ICD-10-CM | POA: Insufficient documentation

## 2022-10-09 DIAGNOSIS — C180 Malignant neoplasm of cecum: Secondary | ICD-10-CM | POA: Insufficient documentation

## 2022-10-09 DIAGNOSIS — Z862 Personal history of diseases of the blood and blood-forming organs and certain disorders involving the immune mechanism: Secondary | ICD-10-CM | POA: Insufficient documentation

## 2022-10-09 DIAGNOSIS — D123 Benign neoplasm of transverse colon: Secondary | ICD-10-CM | POA: Insufficient documentation

## 2022-10-09 DIAGNOSIS — I959 Hypotension, unspecified: Secondary | ICD-10-CM | POA: Diagnosis not present

## 2022-10-09 DIAGNOSIS — Z9049 Acquired absence of other specified parts of digestive tract: Secondary | ICD-10-CM | POA: Insufficient documentation

## 2022-10-09 LAB — CEA (ACCESS): CEA (CHCC): 1.87 ng/mL (ref 0.00–5.00)

## 2022-10-09 MED ORDER — COMIRNATY 30 MCG/0.3ML IM SUSY
PREFILLED_SYRINGE | INTRAMUSCULAR | 0 refills | Status: DC
  Filled 2022-10-09: qty 0.3, 1d supply, fill #0

## 2022-10-09 NOTE — Progress Notes (Signed)
[facility] OFFICE PROGRESS NOTE   Diagnosis: Colon cancer  INTERVAL HISTORY:   Ms. Foskett returns as scheduled.  She feels well.  Good appetite and energy level.  No difficulty with bowel function.  No bleeding.  She continues to have numbness and tingling in the toes, chiefly at night.  Feels the symptoms are progressing.  She has not been evaluated by her primary provider or neurology.  She has difficulty falling asleep due to the tingling.  Objective:  Vital signs in last 24 hours:  Blood pressure (!) 141/80, pulse 64, temperature 98.1 F (36.7 C), temperature source Oral, resp. rate 18, height '5\' 3"'$  (1.6 m), weight 249 lb 3.2 oz (113 kg), last menstrual period 07/10/2001, SpO2 100 %.    Lymphatics: No cervical, supraclavicular, axillary, or inguinal nodes Resp: Lungs clear bilaterally Cardio: Regular rate and rhythm GI: No hepatosplenomegaly, no mass, nontender Vascular: No leg edema      Lab Results:  Lab Results  Component Value Date   WBC 5.6 10/20/2020   HGB 14.3 09/12/2021   HCT 42.0 09/12/2021   MCV 91.7 10/20/2020   PLT 250.0 10/20/2020   NEUTROABS 2.9 10/20/2020    CMP  Lab Results  Component Value Date   NA 137 09/12/2021   K 3.7 09/12/2021   CL 101 09/12/2021   CO2 23 03/19/2021   GLUCOSE 99 09/12/2021   BUN 12 09/12/2021   CREATININE 0.60 09/12/2021   CALCIUM 10.2 03/19/2021   PROT 6.7 03/19/2021   ALBUMIN 4.3 03/19/2021   AST 16 03/19/2021   ALT 18 03/19/2021   ALKPHOS 99 03/19/2021   BILITOT 0.5 03/19/2021   GFRNONAA >60 08/02/2020   GFRAA >60 08/13/2019    Lab Results  Component Value Date   CEA1 1.09 01/22/2021   CEA 1.87 10/09/2022    Medications: I have reviewed the patient's current medications.   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 MSI-stable 3/14 lymph nodes positive for metastatic carcinoma, lymphovascular invasion present Cycle 1  FOLFOX 10/22/2017 CTs chest, abdomen, and pelvis 10/28/2017- negative for metastatic disease Cycle 2 FOLFOX 11/05/2017 Cycle 3 FOLFOX 11/27/2017 Cycle 4 FOLFOX 12/10/2017 Cycle 5 FOLFOX 12/24/2017 Cycle 6 FOLFOX 01/08/2018 (Oxaliplatin held due to thrombocytopenia) Cycle 7 FOLFOX 01/21/2018-oxaliplatin resumed Cycle 8 FOLFOX 02/04/2018-Oxaliplatin eliminated due to an allergic reaction with cycle 7 Cycle 9 FOLFOX 03/04/2018- no Oxaliplatin; 5-FU bolus eliminated, infusional 5-FU dose reduced Cycle 10 FOLFOX 03/17/2018 Cycle 11 FOLFOX 03/31/2018 Cycle 12 FOLFOX 04/14/2018 Surveillance colonoscopy 06/30/2018-tubular adenoma removed from the transverse colon CTs 08/25/2018-negative CTs 08/13/2019-no evidence of recurrent colon cancer, focal fatty change in the left liver CTs 08/02/2020-no evidence of recurrent disease Colonoscopy 09/07/2021-negative CTs 02/21/2022-no evidence of recurrent or metastatic disease in the chest, abdomen or pelvis.   2.   Asthma   3.   Port-A-Cath placement 10/17/2017   4.   History of neutropenia secondary to chemotherapy-resolved   5.   History of thrombocytopenia secondary to chemotherapy   6.   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   Diarrhea following cycle 8 FOLFOX-resolved, 5-FU bolus eliminated and infusion dose decreased beginning with cycle 9     Disposition: Ms. Bonine remains in clinical remission from colon cancer.  She is now 5 years out from diagnosis.  She would like to continue follow-up at the Cancer center.  She will return for an office visit and CEA in 6 months.  She will continue colonoscopy surveillance with Dr. Benson Norway.  She does not wish to begin medical therapy for the neuropathy.  It is possible the neuropathy symptoms are related oxaliplatin versus another etiology.  I recommend she follow-up with her primary provider or neurology for further evaluation of neuropathy symptoms.  She will try  Tylenol PM for insomnia.  Betsy Coder, MD  10/09/2022  10:53 AM

## 2022-10-17 ENCOUNTER — Ambulatory Visit (INDEPENDENT_AMBULATORY_CARE_PROVIDER_SITE_OTHER): Payer: Medicare PPO | Admitting: Medical

## 2022-10-17 ENCOUNTER — Encounter: Payer: Self-pay | Admitting: Medical

## 2022-10-17 VITALS — BP 138/78 | HR 67 | Resp 18 | Ht 63.0 in | Wt 251.0 lb

## 2022-10-17 DIAGNOSIS — G629 Polyneuropathy, unspecified: Secondary | ICD-10-CM | POA: Diagnosis not present

## 2022-10-17 DIAGNOSIS — K219 Gastro-esophageal reflux disease without esophagitis: Secondary | ICD-10-CM

## 2022-10-17 DIAGNOSIS — R739 Hyperglycemia, unspecified: Secondary | ICD-10-CM

## 2022-10-17 DIAGNOSIS — J4540 Moderate persistent asthma, uncomplicated: Secondary | ICD-10-CM

## 2022-10-17 DIAGNOSIS — E559 Vitamin D deficiency, unspecified: Secondary | ICD-10-CM | POA: Diagnosis not present

## 2022-10-17 DIAGNOSIS — J309 Allergic rhinitis, unspecified: Secondary | ICD-10-CM

## 2022-10-17 DIAGNOSIS — Z23 Encounter for immunization: Secondary | ICD-10-CM | POA: Diagnosis not present

## 2022-10-17 DIAGNOSIS — R030 Elevated blood-pressure reading, without diagnosis of hypertension: Secondary | ICD-10-CM

## 2022-10-17 DIAGNOSIS — R252 Cramp and spasm: Secondary | ICD-10-CM | POA: Diagnosis not present

## 2022-10-17 DIAGNOSIS — E7849 Other hyperlipidemia: Secondary | ICD-10-CM | POA: Diagnosis not present

## 2022-10-17 LAB — VITAMIN B12: Vitamin B-12: 384 pg/mL (ref 211–911)

## 2022-10-17 LAB — COMPREHENSIVE METABOLIC PANEL
ALT: 16 U/L (ref 0–35)
AST: 15 U/L (ref 0–37)
Albumin: 4.1 g/dL (ref 3.5–5.2)
Alkaline Phosphatase: 87 U/L (ref 39–117)
BUN: 11 mg/dL (ref 6–23)
CO2: 28 mEq/L (ref 19–32)
Calcium: 10.1 mg/dL (ref 8.4–10.5)
Chloride: 102 mEq/L (ref 96–112)
Creatinine, Ser: 0.67 mg/dL (ref 0.40–1.20)
GFR: 91.13 mL/min (ref >60.00)
Glucose, Bld: 90 mg/dL (ref 70–99)
Potassium: 4.1 mEq/L (ref 3.5–5.1)
Sodium: 139 mEq/L (ref 135–145)
Total Bilirubin: 0.5 mg/dL (ref 0.2–1.2)
Total Protein: 6.8 g/dL (ref 6.0–8.3)

## 2022-10-17 LAB — CBC WITH DIFFERENTIAL/PLATELET
Basophils Absolute: 0 10*3/uL (ref 0.0–0.1)
Basophils Relative: 0.7 % (ref 0.0–3.0)
Eosinophils Absolute: 0.2 10*3/uL (ref 0.0–0.7)
Eosinophils Relative: 2.7 % (ref 0.0–5.0)
HCT: 41.6 % (ref 36.0–46.0)
Hemoglobin: 14 g/dL (ref 12.0–15.0)
Lymphocytes Relative: 32.1 % (ref 12.0–46.0)
Lymphs Abs: 2.2 10*3/uL (ref 0.7–4.0)
MCHC: 33.6 g/dL (ref 30.0–36.0)
MCV: 92.6 fl (ref 78.0–100.0)
Monocytes Absolute: 0.5 10*3/uL (ref 0.1–1.0)
Monocytes Relative: 7 % (ref 3.0–12.0)
Neutro Abs: 4 10*3/uL (ref 1.4–7.7)
Neutrophils Relative %: 57.5 % (ref 43.0–77.0)
Platelets: 270 10*3/uL (ref 150.0–400.0)
RBC: 4.49 Mil/uL (ref 3.87–5.11)
RDW: 13.3 % (ref 11.5–15.5)
WBC: 7 10*3/uL (ref 4.0–10.5)

## 2022-10-17 LAB — LIPID PANEL
Cholesterol: 214 mg/dL — ABNORMAL HIGH (ref 0–200)
HDL: 51.7 mg/dL (ref >39.00)
LDL Cholesterol: 136 mg/dL — ABNORMAL HIGH (ref 0–99)
NonHDL: 162.52
Total CHOL/HDL Ratio: 4
Triglycerides: 132 mg/dL (ref 0.0–149.0)
VLDL: 26.4 mg/dL (ref 0.0–40.0)

## 2022-10-17 LAB — MAGNESIUM: Magnesium: 1.8 mg/dL (ref 1.5–2.5)

## 2022-10-17 LAB — VITAMIN D 25 HYDROXY (VIT D DEFICIENCY, FRACTURES): VITD: 25.67 ng/mL — ABNORMAL LOW (ref 30.00–100.00)

## 2022-10-17 LAB — HEMOGLOBIN A1C: Hgb A1c MFr Bld: 5.3 % (ref 4.6–6.5)

## 2022-10-17 NOTE — Patient Instructions (Addendum)
1. Vitamin D deficiency Check level today - VITAMIN D 25 Hydroxy (Vit-D Deficiency, Fractures)  2. Other hyperlipidemia In past was on statin. Recheck today and likely restart. - CBC w/Diff - Comp Met (CMET) - Lipid panel  3. Gastroesophageal reflux disease without esophagitis Can continue prevacid for occasional/rare symptoms. Try to be strict with healthy diet.  4. Elevated blood sugar Check to see if diabetic. Low sugar diet and exercise recommended presently. - CBC w/Diff - Comp Met (CMET) - Hemoglobin A1c  5. Neuropathy If labs normal. Consider gabapentin. Since you indicate don't want to use med could consider referral to specialist.  - B12 - Vitamin B1  6. Moderate persistent asthma without complication Continue to follow up with pulmonolgist. Presently controlled and using symbicort.  7. Allergic rhinitis, unspecified seasonality, unspecified trigger  Controlled presently. Recommend as spring approaches restart. Xyzal, montelukast and Flonase nasal spray.   8. Elevated blood pressure reading Check bp daily and update me by my chart in one week. May rx bp med.   Pcv 20 vaccine today. Shingrix can be done thru pharmacy.   Follow up date to be detemined after lab review.

## 2022-10-17 NOTE — Addendum Note (Signed)
Addended by: Jeronimo Greaves on: 10/17/2022 09:59 AM   Modules accepted: Orders
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
## 2022-10-19 MED ORDER — VITAMIN D (ERGOCALCIFEROL) 1.25 MG (50000 UNIT) PO CAPS: 50000.0000 [IU] | ORAL_CAPSULE | ORAL | 0 refills | Status: DC

## 2022-10-19 MED ORDER — ATORVASTATIN CALCIUM 10 MG PO TABS: 10.0000 mg | ORAL_TABLET | Freq: Every day | ORAL | 3 refills | Status: DC

## 2022-10-19 NOTE — Addendum Note (Signed)
Addended by: Anabel Halon on: 10/19/2022 08:50 AM   Modules accepted: Orders

## 2022-10-21 LAB — VITAMIN B1: Vitamin B1 (Thiamine): 13 nmol/L (ref 8–30)

## 2022-11-26 DIAGNOSIS — H25813 Combined forms of age-related cataract, bilateral: Secondary | ICD-10-CM | POA: Diagnosis not present

## 2022-11-26 DIAGNOSIS — H10413 Chronic giant papillary conjunctivitis, bilateral: Secondary | ICD-10-CM | POA: Diagnosis not present

## 2022-11-26 DIAGNOSIS — H04123 Dry eye syndrome of bilateral lacrimal glands: Secondary | ICD-10-CM | POA: Diagnosis not present

## 2022-12-11 ENCOUNTER — Telehealth: Payer: Self-pay | Admitting: Medical

## 2022-12-11 NOTE — Telephone Encounter (Signed)
[facility] to schedule their annual wellness visit. Appointment made for 12/16/2022.  Sherol Dade; Care Guide [facility] Direct Dial: [phone]

## 2022-12-16 ENCOUNTER — Ambulatory Visit (INDEPENDENT_AMBULATORY_CARE_PROVIDER_SITE_OTHER): Payer: Medicare PPO | Admitting: *Deleted

## 2022-12-16 ENCOUNTER — Encounter: Payer: Self-pay | Admitting: Oncology

## 2022-12-16 ENCOUNTER — Other Ambulatory Visit (INDEPENDENT_AMBULATORY_CARE_PROVIDER_SITE_OTHER): Payer: Medicare PPO

## 2022-12-16 VITALS — BP 133/81 | HR 62 | Ht 63.0 in | Wt 252.0 lb

## 2022-12-16 DIAGNOSIS — Z0000 Encounter for general adult medical examination without abnormal findings: Secondary | ICD-10-CM

## 2022-12-16 DIAGNOSIS — Z780 Asymptomatic menopausal state: Secondary | ICD-10-CM

## 2022-12-16 DIAGNOSIS — E559 Vitamin D deficiency, unspecified: Secondary | ICD-10-CM | POA: Diagnosis not present

## 2022-12-16 LAB — VITAMIN D 25 HYDROXY (VIT D DEFICIENCY, FRACTURES): VITD: 30.38 ng/mL (ref 30.00–100.00)

## 2022-12-16 NOTE — Patient Instructions (Signed)
[name] , Thank you for taking time to come for your Medicare Wellness Visit. I appreciate your ongoing commitment to your health goals. Please review the following plan we discussed and let me know if I can assist you in the future.   These are the goals we discussed:  Goals   None     This is a list of the screening recommended for you and due dates:  Health Maintenance  Topic Date Due   Zoster (Shingles) Vaccine (1 of 2) Never done   COVID-19 Vaccine (7 - 2023-24 season) 12/04/2022   DTaP/Tdap/Td vaccine (3 - Td or Tdap) 01/21/2023   Flu Shot  04/10/2023   Mammogram  10/03/2023   Medicare Annual Wellness Visit  12/16/2023   Colon Cancer Screening  09/08/2031   Pneumonia Vaccine  Completed   DEXA scan (bone density measurement)  Completed   Hepatitis C Screening: USPSTF Recommendation to screen - Ages 35-79 yo.  Completed   HPV Vaccine  Aged Out     Next appointment: Follow up in one year for your annual wellness visit.   Preventive Care 12 Years and Older, Female Preventive care refers to lifestyle choices and visits with your health care provider that can promote health and wellness. What does preventive care include? A yearly physical exam. This is also called an annual well check. Dental exams once or twice a year. Routine eye exams. Ask your health care provider how often you should have your eyes checked. Personal lifestyle choices, including: Daily care of your teeth and gums. Regular physical activity. Eating a healthy diet. Avoiding tobacco and drug use. Limiting alcohol use. Practicing safe sex. Taking low-dose aspirin every day. Taking vitamin and mineral supplements as recommended by your health care provider. What happens during an annual well check? The services and screenings done by your health care provider during your annual well check will depend on your age, overall health, lifestyle risk factors, and family history of disease. Counseling  Your  health care provider may ask you questions about your: Alcohol use. Tobacco use. Drug use. Emotional well-being. Home and relationship well-being. Sexual activity. Eating habits. History of falls. Memory and ability to understand (cognition). Work and work Astronomer. Reproductive health. Screening  You may have the following tests or measurements: Height, weight, and BMI. Blood pressure. Lipid and cholesterol levels. These may be checked every 5 years, or more frequently if you are over 30 years old. Skin check. Lung cancer screening. You may have this screening every year starting at age 66 if you have a 30-pack-year history of smoking and currently smoke or have quit within the past 15 years. Fecal occult blood test (FOBT) of the stool. You may have this test every year starting at age 35. Flexible sigmoidoscopy or colonoscopy. You may have a sigmoidoscopy every 5 years or a colonoscopy every 10 years starting at age 34. Hepatitis C blood test. Hepatitis B blood test. Sexually transmitted disease (STD) testing. Diabetes screening. This is done by checking your blood sugar (glucose) after you have not eaten for a while (fasting). You may have this done every 1-3 years. Bone density scan. This is done to screen for osteoporosis. You may have this done starting at age 30. Mammogram. This may be done every 1-2 years. Talk to your health care provider about how often you should have regular mammograms. Talk with your health care provider about your test results, treatment options, and if necessary, the need for more tests. Vaccines  Your health care provider may recommend certain vaccines, such as: Influenza vaccine. This is recommended every year. Tetanus, diphtheria, and acellular pertussis (Tdap, Td) vaccine. You may need a Td booster every 10 years. Zoster vaccine. You may need this after age 33. Pneumococcal 13-valent conjugate (PCV13) vaccine. One dose is recommended after age  70. Pneumococcal polysaccharide (PPSV23) vaccine. One dose is recommended after age 43. Talk to your health care provider about which screenings and vaccines you need and how often you need them. This information is not intended to replace advice given to you by your health care provider. Make sure you discuss any questions you have with your health care provider. Document Released: 09/22/2015 Document Revised: 05/15/2016 Document Reviewed: 06/27/2015 Elsevier Interactive Patient Education  2017 [employer] Prevention in the Home Falls can cause injuries. They can happen to people of all ages. There are many things you can do to make your home safe and to help prevent falls. What can I do on the outside of my home? Regularly fix the edges of walkways and driveways and fix any cracks. Remove anything that might make you trip as you walk through a door, such as a raised step or threshold. Trim any bushes or trees on the path to your home. Use bright outdoor lighting. Clear any walking paths of anything that might make someone trip, such as rocks or tools. Regularly check to see if handrails are loose or broken. Make sure that both sides of any steps have handrails. Any raised decks and porches should have guardrails on the edges. Have any leaves, snow, or ice cleared regularly. Use sand or salt on walking paths during winter. Clean up any spills in your garage right away. This includes oil or grease spills. What can I do in the bathroom? Use night lights. Install grab bars by the toilet and in the tub and shower. Do not use towel bars as grab bars. Use non-skid mats or decals in the tub or shower. If you need to sit down in the shower, use a plastic, non-slip stool. Keep the floor dry. Clean up any water that spills on the floor as soon as it happens. Remove soap buildup in the tub or shower regularly. Attach bath mats securely with double-sided non-slip rug tape. Do not have throw  rugs and other things on the floor that can make you trip. What can I do in the bedroom? Use night lights. Make sure that you have a light by your bed that is easy to reach. Do not use any sheets or blankets that are too big for your bed. They should not hang down onto the floor. Have a firm chair that has side arms. You can use this for support while you get dressed. Do not have throw rugs and other things on the floor that can make you trip. What can I do in the kitchen? Clean up any spills right away. Avoid walking on wet floors. Keep items that you use a lot in easy-to-reach places. If you need to reach something above you, use a strong step stool that has a grab bar. Keep electrical cords out of the way. Do not use floor polish or wax that makes floors slippery. If you must use wax, use non-skid floor wax. Do not have throw rugs and other things on the floor that can make you trip. What can I do with my stairs? Do not leave any items on the stairs. Make sure that there are  handrails on both sides of the stairs and use them. Fix handrails that are broken or loose. Make sure that handrails are as long as the stairways. Check any carpeting to make sure that it is firmly attached to the stairs. Fix any carpet that is loose or worn. Avoid having throw rugs at the top or bottom of the stairs. If you do have throw rugs, attach them to the floor with carpet tape. Make sure that you have a light switch at the top of the stairs and the bottom of the stairs. If you do not have them, ask someone to add them for you. What else can I do to help prevent falls? Wear shoes that: Do not have high heels. Have rubber bottoms. Are comfortable and fit you well. Are closed at the toe. Do not wear sandals. If you use a stepladder: Make sure that it is fully opened. Do not climb a closed stepladder. Make sure that both sides of the stepladder are locked into place. Ask someone to hold it for you, if  possible. Clearly mark and make sure that you can see: Any grab bars or handrails. First and last steps. Where the edge of each step is. Use tools that help you move around (mobility aids) if they are needed. These include: Canes. Walkers. Scooters. Crutches. Turn on the lights when you go into a dark area. Replace any light bulbs as soon as they burn out. Set up your furniture so you have a clear path. Avoid moving your furniture around. If any of your floors are uneven, fix them. If there are any pets around you, be aware of where they are. Review your medicines with your doctor. Some medicines can make you feel dizzy. This can increase your chance of falling. Ask your doctor what other things that you can do to help prevent falls. This information is not intended to replace advice given to you by your health care provider. Make sure you discuss any questions you have with your health care provider. Document Released: 06/22/2009 Document Revised: 02/01/2016 Document Reviewed: 09/30/2014 Elsevier Interactive Patient Education  2017 [employer].
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
## 2022-12-17 ENCOUNTER — Telehealth: Payer: Self-pay | Admitting: Medical

## 2022-12-17 NOTE — Telephone Encounter (Signed)
Pt called to verify that she has been taken the vit. D weekly as prescribed. Pt said that she was also given atorvastatin once daily and she noticed on the pharmacy pamphlet that the two medicines should not be taken together because it reduces the effectiveness. Pt wonders if this is the cause of the levels still being low. Please call to advise next steps

## 2022-12-18 MED ORDER — VITAMIN D (ERGOCALCIFEROL) 1.25 MG (50000 UNIT) PO CAPS: 50000.0000 [IU] | ORAL_CAPSULE | ORAL | 0 refills | Status: DC

## 2022-12-18 NOTE — Addendum Note (Signed)
Addended by: Gwenevere Abbot on: 12/18/2022 05:42 PM   Modules accepted: Orders

## 2022-12-19 ENCOUNTER — Other Ambulatory Visit: Payer: Self-pay | Admitting: Allergy

## 2022-12-19 NOTE — Telephone Encounter (Signed)
Mychart message sent.

## 2023-03-17 ENCOUNTER — Other Ambulatory Visit: Payer: Self-pay | Admitting: Allergy

## 2023-04-03 ENCOUNTER — Encounter: Payer: Self-pay | Admitting: Oncology

## 2023-04-10 ENCOUNTER — Ambulatory Visit: Payer: Medicare PPO | Admitting: Nurse Practitioner

## 2023-04-10 ENCOUNTER — Other Ambulatory Visit: Payer: Medicare PPO

## 2023-05-08 ENCOUNTER — Inpatient Hospital Stay: Payer: Medicare PPO | Admitting: Nurse Practitioner

## 2023-05-08 ENCOUNTER — Other Ambulatory Visit (HOSPITAL_BASED_OUTPATIENT_CLINIC_OR_DEPARTMENT_OTHER): Payer: Self-pay

## 2023-05-08 ENCOUNTER — Encounter: Payer: Self-pay | Admitting: Nurse Practitioner

## 2023-05-08 ENCOUNTER — Inpatient Hospital Stay: Payer: Medicare PPO | Attending: Nurse Practitioner

## 2023-05-08 VITALS — BP 152/80 | HR 66 | Temp 98.2°F | Resp 18 | Ht 63.0 in | Wt 251.9 lb

## 2023-05-08 DIAGNOSIS — C180 Malignant neoplasm of cecum: Secondary | ICD-10-CM | POA: Insufficient documentation

## 2023-05-08 DIAGNOSIS — J45909 Unspecified asthma, uncomplicated: Secondary | ICD-10-CM | POA: Diagnosis not present

## 2023-05-08 DIAGNOSIS — Z79899 Other long term (current) drug therapy: Secondary | ICD-10-CM | POA: Diagnosis not present

## 2023-05-08 DIAGNOSIS — B351 Tinea unguium: Secondary | ICD-10-CM

## 2023-05-08 DIAGNOSIS — R202 Paresthesia of skin: Secondary | ICD-10-CM | POA: Diagnosis not present

## 2023-05-08 DIAGNOSIS — R200 Anesthesia of skin: Secondary | ICD-10-CM | POA: Insufficient documentation

## 2023-05-08 DIAGNOSIS — R197 Diarrhea, unspecified: Secondary | ICD-10-CM | POA: Insufficient documentation

## 2023-05-08 DIAGNOSIS — C182 Malignant neoplasm of ascending colon: Secondary | ICD-10-CM

## 2023-05-08 DIAGNOSIS — T7840XA Allergy, unspecified, initial encounter: Secondary | ICD-10-CM | POA: Insufficient documentation

## 2023-05-08 DIAGNOSIS — Z9049 Acquired absence of other specified parts of digestive tract: Secondary | ICD-10-CM | POA: Insufficient documentation

## 2023-05-08 LAB — CEA (ACCESS): CEA (CHCC): 1.24 ng/mL (ref 0.00–5.00)

## 2023-05-08 MED ORDER — ZOSTER VAC RECOMB ADJUVANTED 50 MCG/0.5ML IM SUSR
0.5000 mL | Freq: Once | INTRAMUSCULAR | 0 refills | Status: AC
  Filled 2023-05-08: qty 0.5, 1d supply, fill #0

## 2023-05-08 NOTE — Progress Notes (Signed)
  [facility] OFFICE PROGRESS NOTE   Diagnosis: Colon cancer  INTERVAL HISTORY:   Sharon Allison returns as scheduled.  No change in bowel habit.  No bleeding.  She is up-to-date on colonoscopy surveillance.  She continues to have numbness/tingling in the toes and balls of the feet.  No symptoms in the fingertips.  She would like to see neurology.  She notes multiple discolored toenails.  Objective:  Vital signs in last 24 hours:  Blood pressure (!) 151/77, pulse 66, temperature 98.2 F (36.8 C), temperature source Oral, resp. rate 18, height 5\' 3"  (1.6 m), weight 251 lb 14.4 oz (114.3 kg), last menstrual period 07/10/2001, SpO2 100%.    Lymphatics: No palpable cervical, supraclavicular, axillary or inguinal lymph nodes. Resp: Lungs clear bilaterally. Cardio: Regular rate and rhythm. GI: Abdomen soft and nontender.  No hepatosplenomegaly. Vascular: No leg edema. Skin: Multiple toenails have a yellow discoloration and appear thickened.   Lab Results:  Lab Results  Component Value Date   WBC 7.0 10/17/2022   HGB 14.0 10/17/2022   HCT 41.6 10/17/2022   MCV 92.6 10/17/2022   PLT 270.0 10/17/2022   NEUTROABS 4.0 10/17/2022    Imaging:  No results found.  Medications: I have reviewed the patient's current medications.  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 MSI-stable 3/14 lymph nodes positive for metastatic carcinoma, lymphovascular invasion present Cycle 1 FOLFOX 10/22/2017 CTs chest, abdomen, and pelvis 10/28/2017- negative for metastatic disease Cycle 2 FOLFOX 11/05/2017 Cycle 3 FOLFOX 11/27/2017 Cycle 4 FOLFOX 12/10/2017 Cycle 5 FOLFOX 12/24/2017 Cycle 6 FOLFOX 01/08/2018 (Oxaliplatin held due to thrombocytopenia) Cycle 7 FOLFOX 01/21/2018-oxaliplatin resumed Cycle 8 FOLFOX 02/04/2018-Oxaliplatin eliminated due to an allergic reaction with cycle 7 Cycle 9 FOLFOX 03/04/2018- no Oxaliplatin; 5-FU  bolus eliminated, infusional 5-FU dose reduced Cycle 10 FOLFOX 03/17/2018 Cycle 11 FOLFOX 03/31/2018 Cycle 12 FOLFOX 04/14/2018 Surveillance colonoscopy 06/30/2018-tubular adenoma removed from the transverse colon CTs 08/25/2018-negative CTs 08/13/2019-no evidence of recurrent colon cancer, focal fatty change in the left liver CTs 08/02/2020-no evidence of recurrent disease Colonoscopy 09/07/2021-negative CTs 02/21/2022-no evidence of recurrent or metastatic disease in the chest, abdomen or pelvis.   2.   Asthma   3.   Port-A-Cath placement 10/17/2017   4.   History of neutropenia secondary to chemotherapy-resolved   5.   History of thrombocytopenia secondary to chemotherapy   6.   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   Diarrhea following cycle 8 FOLFOX-resolved, 5-FU bolus eliminated and infusion dose decreased beginning with cycle 9  Disposition: Sharon Allison remains in clinical remission from colon cancer.  CEA is stable in normal range.  She is up-to-date on colonoscopy surveillance.  She has persistent numbness/tingling in the feet.  She would like to see neurology.  Referral placed.  She has multiple discolored and thickened toenails.  Referral made to podiatry.  She will return for a CEA and follow-up visit in 6 months.  Sharon Allison ANP/GNP-BC   05/08/2023  2:05 PM

## 2023-05-14 ENCOUNTER — Encounter: Payer: Self-pay | Admitting: Neurology

## 2023-05-20 NOTE — Progress Notes (Signed)
Initial neurology clinic note  Reason for Evaluation: Consultation requested by SaguierRamon Dredge, PA-C for an opinion regarding numbness and tingling in feet. My final recommendations will be communicated back to the requesting physician by way of shared medical record or letter to requesting physician via [place] mail.  HPI: This is [name], a 67 y.o. right-handed female with a medical history of colon cancer, HTN, HLD, OSA (on CPAP), vit D deficiency, asthma, GERD who presents to neurology clinic with the chief complaint of numbness and tingling in feet. The patient is alone today.  Patient's symptoms started with burning in feet and tingling. It initially was intermittent. It would mostly happen when she was not moving or at night in bed. She does not get symptoms as much when she is moving around. Symptoms started during chemotherapy for colon cancer (FOLFOX), about 2 weeks into chemo (2019). She rates the discomfort as 3/10. It mostly prevents her falling asleep on some nights. Over time, the symptoms have spread into calves. She had some symptoms in her hands during chemo, but this pretty much went away. There is only very occasional, mild tingling in hands. Patient has never been on medication before.  She endorses some imbalance, but denies falls.  She does endorse occasional cramps in her legs (calves) that wake her up in sleep, but this has not happened in a while.  The patient does not report symptoms referable to autonomic dysfunction including impaired sweating, heat or cold intolerance, excessive mucosal dryness, gastroparetic early satiety, postprandial abdominal bloating, constipation, bowel or bladder dyscontrol, or syncope/presyncope/orthostatic intolerance.  She does not report any constitutional symptoms like fever, night sweats, anorexia or unintentional weight loss.  EtOH use: Very rare (maybe once per year)  Restrictive diet? No Family history of  neuropathy/myopathy/neurologic disease? Brother with neuropathy (?DM)  Cancer history per oncology note from 05/08/23: Adenocarcinoma of the cecum, stage IIIa (T1,N1b), status post a right colectomy 09/18/2017 MSI-stable 3/14 lymph nodes positive for metastatic carcinoma, lymphovascular invasion present Cycle 1 FOLFOX 10/22/2017 CTs chest, abdomen, and pelvis 10/28/2017- negative for metastatic disease Cycle 2 FOLFOX 11/05/2017 Cycle 3 FOLFOX 11/27/2017 Cycle 4 FOLFOX 12/10/2017 Cycle 5 FOLFOX 12/24/2017 Cycle 6 FOLFOX 01/08/2018 (Oxaliplatin held due to thrombocytopenia) Cycle 7 FOLFOX 01/21/2018-oxaliplatin resumed Cycle 8 FOLFOX 02/04/2018-Oxaliplatin eliminated due to an allergic reaction with cycle 7 Cycle 9 FOLFOX 03/04/2018- no Oxaliplatin; 5-FU bolus eliminated, infusional 5-FU dose reduced Cycle 10 FOLFOX 03/17/2018 Cycle 11 FOLFOX 03/31/2018 Cycle 12 FOLFOX 04/14/2018 Surveillance colonoscopy 06/30/2018-tubular adenoma removed from the transverse colon CTs 08/25/2018-negative CTs 08/13/2019-no evidence of recurrent colon cancer, focal fatty change in the left liver CTs 08/02/2020-no evidence of recurrent disease Colonoscopy 09/07/2021-negative CTs 02/21/2022-no evidence of recurrent or metastatic disease in the chest, abdomen or pelvis.   MEDICATIONS:  Outpatient Encounter Medications as of 05/29/2023  Medication Sig   albuterol (VENTOLIN HFA) 108 (90 Base) MCG/ACT inhaler Inhale 2 puffs into the lungs every 4 (four) hours as needed for wheezing or shortness of breath.   atorvastatin (LIPITOR) 10 MG tablet Take 1 tablet (10 mg total) by mouth daily.   cholecalciferol (VITAMIN D3) 25 MCG (1000 UNIT) tablet Take 1,000 Units by mouth daily.   ciclopirox (PENLAC) 8 % solution Apply topically at bedtime. Apply over nail and surrounding skin. Apply daily over previous coat. After seven (7) days, may remove with alcohol and continue cycle.   levocetirizine (XYZAL) 5 MG tablet TAKE 1 TABLET BY MOUTH AS  NEEDED FOR ALLERGIES  montelukast (SINGULAIR) 10 MG tablet TAKE 1 TABLET BY MOUTH AT BEDTIME   Multiple Vitamin (MULTIVITAMIN WITH MINERALS) TABS tablet Take 1 tablet by mouth daily.   SYMBICORT 160-4.5 MCG/ACT inhaler Inhale 2 puffs into the lungs in the morning and at bedtime.   Facility-Administered Encounter Medications as of 05/29/2023  Medication   famotidine (PEPCID) IVPB 20 mg premix   heparin lock flush 100 unit/mL   sodium chloride flush (NS) 0.9 % injection 10 mL    PAST MEDICAL HISTORY: Past Medical History:  Diagnosis Date   Allergy    Anemia    Asthma    colon ca dx'd 09/2017   colon cancer   Colon polyp    GERD (gastroesophageal reflux disease)    Heart murmur    per patient " dr white told me i had a murmur but i told him they told me that when i was a kid and it had never bothered me "    History of colon cancer    Hyperlipidemia    Hypertension    Shortness of breath on exertion    Sleep apnea    uses cpap on auto set   Vitamin D deficiency    Wears glasses     PAST SURGICAL HISTORY: Past Surgical History:  Procedure Laterality Date   ABDOMINAL HYSTERECTOMY     2002   COLONOSCOPY N/A 09/18/2017   Procedure: COLONOSCOPY;  Surgeon: Andria Meuse, MD;  Location: [facility];  Service: General;  Laterality: N/A;   COLONOSCOPY WITH PROPOFOL N/A 06/30/2018   Procedure: COLONOSCOPY WITH PROPOFOL;  Surgeon: Charna Elizabeth, MD;  Location: [facility];  Service: Endoscopy;  Laterality: N/A;   COLONOSCOPY WITH PROPOFOL N/A 09/07/2021   Procedure: COLONOSCOPY WITH PROPOFOL;  Surgeon: Jeani Hawking, MD;  Location: [facility];  Service: Endoscopy;  Laterality: N/A;   colonscopy  09/07/2021   LAPAROSCOPIC RIGHT HEMI COLECTOMY Right 09/18/2017   Procedure: LAPAROSCOPIC RIGHT HEMI COLECTOMY ERAS PATHWAY;  Surgeon: Andria Meuse, MD;  Location: [facility];  Service: General;  Laterality: Right;   MYOMECTOMY  DONE 5 YEARS PRIOR TO HYSTERECTOMY   multiple fibroids    POLYPECTOMY  06/30/2018   Procedure: POLYPECTOMY;  Surgeon: Charna Elizabeth, MD;  Location: [facility];  Service: Endoscopy;;   PORT-A-CATH REMOVAL N/A 09/12/2021   Procedure: REMOVAL PORT-A-CATH;  Surgeon: Andria Meuse, MD;  Location: [facility];  Service: General;  Laterality: N/A;   PORTACATH PLACEMENT N/A 10/17/2017   Procedure: INSERTION PORT-A-CATH;  Surgeon: Andria Meuse, MD;  Location: [facility];  Service: General;  Laterality: N/A;    ALLERGIES: Allergies  Allergen Reactions   Oxaliplatin Shortness Of Breath    Sweating    Wellbutrin [Bupropion] Itching and Other (See Comments)    Facial redness   Iohexol Swelling    Patient had swelling and itching to face in 1980's. Had 13 hour pre meds before scan 10/27/17   Iodine Swelling   Sulfa Antibiotics Itching and Other (See Comments)    FAMILY HISTORY: Family History  Problem Relation Age of Onset   Hypertension Mother    Leukemia Mother    Diabetes Mother    Obesity Mother    Lupus Father    Leukemia Sister    Kidney disease Brother    Leukemia Brother    Hodgkin's lymphoma Maternal Grandmother    Cancer Maternal Grandfather        prostate   Obesity Other    Allergic rhinitis Neg Hx  Asthma Neg Hx    Eczema Neg Hx    Urticaria Neg Hx     SOCIAL HISTORY: Social History   Tobacco Use   Smoking status: Never   Smokeless tobacco: Never  Vaping Use   Vaping status: Never Used  Substance Use Topics   Alcohol use: Yes    Comment: rare - Once a year   Drug use: No   Social History   Social History Narrative   Are you right handed or left handed? Right Handed   Are you currently employed ? No    What is your current occupation? Retired but works part time at [employer].    Do you live at home alone? Yes   Who lives with you?    What type of home do you live in: 1 story or 2 story? Lives in a one story home.         OBJECTIVE: PHYSICAL EXAM: BP (!) 140/76   Pulse 73    Ht 5\' 3"  (1.6 m)   Wt 252 lb (114.3 kg)   LMP 07/10/2001 (LMP Unknown)   SpO2 99%   BMI 44.64 kg/m   General: General appearance: Awake and alert. No distress. Cooperative with exam.  Skin: No obvious rash or jaundice. HEENT: Atraumatic. Anicteric. Lungs: Non-labored breathing on room air  Extremities: Mild bilateral peripheral edema. Psych: Affect appropriate.  Neurological: Mental Status: Alert. Speech fluent. No pseudobulbar affect Cranial Nerves: CNII: No RAPD. Visual fields grossly intact. CNIII, IV, VI: PERRL. No nystagmus. EOMI. CN V: Facial sensation intact bilaterally to fine touch. CN VII: Facial muscles symmetric and strong. No ptosis at rest. CN VIII: Hearing grossly intact bilaterally. CN IX: No hypophonia. CN X: Palate elevates symmetrically. CN XI: Full strength shoulder shrug bilaterally. CN XII: Tongue protrusion full and midline. No atrophy or fasciculations. No significant dysarthria Motor: Tone is normal. Strength is 5/5 in bilateral upper and lower extremities Reflexes:  Right Left   Bicep 2+ 2+   Tricep 2+ 2+   BrRad 2+ 2+   Knee 2+ 2+   Ankle 0 0    Pathological Reflexes: Babinski: mute response bilaterally Hoffman: absent bilaterally Troemner: absent bilaterally Sensation: Pinprick: Intact in upper extremities. 60% of normal in right foot, 40% of normal in left foot Vibration: Intact in upper extremities. 14 seconds in right great toe, 13 seconds in left great toe Proprioception: Intact in bilateral great toes Coordination: Intact finger-to- nose-finger bilaterally. Romberg with mild sway. Gait: Able to rise from chair with arms crossed unassisted. Normal, narrow-based gait. Able to walk on toes and heels.  Lab and Test Review: Internal labs: Vit D (12/16/22): 30.28  10/17/22: Mg 1.8 B1 wnl B12: 384 HbA1c: 5.3 CMP unremarkable CBC w/ diff unremarkable Lipid panel: Component     Latest Ref Rng 10/17/2022  Cholesterol     0 - 200  mg/dL 161 (H)   Triglycerides     0.0 - 149.0 mg/dL 096.0   HDL Cholesterol     >39.00 mg/dL 45.40   VLDL     0.0 - 40.0 mg/dL 98.1   LDL (calc)     0 - 99 mg/dL 191 (H)   Total CHOL/HDL Ratio 4   NonHDL 162.52     ASSESSMENT: SHIRIKA LEBOWITZ is a 67 y.o. female who presents for evaluation of numbness, tingling, and burning in legs. She has a relevant medical history of colon cancer, HTN, HLD, OSA (on CPAP), vit D deficiency, asthma, GERD. Her  neurological examination is pertinent for mildly diminished sensation in bilateral feet. Available diagnostic data is significant for HbA1c of 5.3, B1 within normal limits, and B12 within normal limits. 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 Oxaliplatin is the likely cause of her symptoms.  PLAN: -Blood work: IFE -Lidocaine cream PRN -Aphla lipoic acid 600 mg once or twice daily -Discuss prescription medication for burning and tingling, Cymbalta, as it has the most evidence in chemotherapy induced neuropathy. Patient would like to try over the counter medications first though. -Cramp recommendations given (see AVS)  -Return to clinic in 6 months  The impression above as well as the plan as outlined below were extensively discussed with the patient who voiced understanding. All questions were answered to their satisfaction.  The patient was counseled on pertinent fall precautions per the printed material provided today, and as noted under the "Patient Instructions" section below.  When available, results of the above investigations and possible further recommendations will be communicated to the patient via telephone/MyChart. Patient to call office if not contacted after expected testing turnaround time.   Total time spent reviewing records, interview, history/exam, documentation, and coordination of care on day of encounter:  60 min   Thank you for allowing me to  participate in patient's care.  If I can answer any additional questions, I would be pleased to do so.  Jacquelyne Balint, MD   CC: Saguier, Kateri Mc [address] [place] [address]  CC: Referring provider: Marisue Brooklyn [address] [facility] [address] [place],  [place] [address]

## 2023-05-23 ENCOUNTER — Ambulatory Visit: Payer: Medicare PPO | Admitting: Podiatry

## 2023-05-23 DIAGNOSIS — B351 Tinea unguium: Secondary | ICD-10-CM

## 2023-05-23 MED ORDER — CICLOPIROX 8 % EX SOLN: Freq: Every day | CUTANEOUS | 0 refills | Status: DC

## 2023-05-23 NOTE — Progress Notes (Signed)
Subjective:  Patient ID: Sharon Allison, female    DOB: 1956-01-06,  MRN: [id number]  Chief Complaint  Patient presents with   Numbness    Numbness in both feet     67 y.o. female presents with the above complaint.  Patient presents with thickened longer dystrophic mycotic toenails x 9.  She states that most of her toenails have nail fungus she wanted discuss treatment options for it.  She states it does not bother her.  She just wants to try some options she has not tried over the counter option she denies seeing anyone else prior to seeing me denies any other acute complaints.   Review of Systems: Negative except as noted in the HPI. Denies N/V/F/Ch.  Past Medical History:  Diagnosis Date   Allergy    Anemia    Asthma    colon ca dx'd 09/2017   colon cancer   Colon polyp    GERD (gastroesophageal reflux disease)    Heart murmur    per patient " dr white told me i had a murmur but i told him they told me that when i was a kid and it had never bothered me "    History of colon cancer    Hyperlipidemia    Hypertension    Shortness of breath on exertion    Sleep apnea    uses cpap on auto set   Vitamin D deficiency    Wears glasses     Current Outpatient Medications:    ciclopirox (PENLAC) 8 % solution, Apply topically at bedtime. Apply over nail and surrounding skin. Apply daily over previous coat. After seven (7) days, may remove with alcohol and continue cycle., Disp: 6 each, Rfl: 0   albuterol (VENTOLIN HFA) 108 (90 Base) MCG/ACT inhaler, Inhale 2 puffs into the lungs every 4 (four) hours as needed for wheezing or shortness of breath., Disp: 18 g, Rfl: 1   atorvastatin (LIPITOR) 10 MG tablet, Take 1 tablet (10 mg total) by mouth daily., Disp: 90 tablet, Rfl: 3   cholecalciferol (VITAMIN D3) 25 MCG (1000 UNIT) tablet, Take 1,000 Units by mouth daily., Disp: , Rfl:    levocetirizine (XYZAL) 5 MG tablet, TAKE 1 TABLET BY MOUTH AS NEEDED FOR ALLERGIES, Disp: 90 tablet, Rfl: 0    montelukast (SINGULAIR) 10 MG tablet, TAKE 1 TABLET BY MOUTH AT BEDTIME, Disp: 90 tablet, Rfl: 0   Multiple Vitamin (MULTIVITAMIN WITH MINERALS) TABS tablet, Take 1 tablet by mouth daily., Disp: , Rfl:    SYMBICORT 160-4.5 MCG/ACT inhaler, Inhale 2 puffs into the lungs in the morning and at bedtime., Disp: 10.2 g, Rfl: 5 No current facility-administered medications for this visit.  Facility-Administered Medications Ordered in Other Visits:    famotidine (PEPCID) IVPB 20 mg premix, 20 mg, Intravenous, Q12H, Tanner, Kathrin Greathouse., PA-C, Stopped at 01/21/18 1528   heparin lock flush 100 unit/mL, 500 Units, Intracatheter, Once PRN, Ladene Artist, MD   sodium chloride flush (NS) 0.9 % injection 10 mL, 10 mL, Intracatheter, PRN, Ladene Artist, MD  Social History   Tobacco Use  Smoking Status Never  Smokeless Tobacco Never    Allergies  Allergen Reactions   Oxaliplatin Shortness Of Breath    Sweating    Wellbutrin [Bupropion] Itching and Other (See Comments)    Facial redness   Iohexol Swelling    Patient had swelling and itching to face in 1980's. Had 13 hour pre meds before scan 10/27/17   Iodine Swelling  Sulfa Antibiotics Itching and Other (See Comments)   Objective:  There were no vitals filed for this visit. There is no height or weight on file to calculate BMI. Constitutional Well developed. Well nourished.  Vascular Dorsalis pedis pulses palpable bilaterally. Posterior tibial pulses palpable bilaterally. Capillary refill normal to all digits.  No cyanosis or clubbing noted. Pedal hair growth normal.  Neurologic Normal speech. Oriented to person, place, and time. Epicritic sensation to light touch grossly present bilaterally.  Dermatologic Nails thickened and again dystrophic mycotic toenails x 9. Skin within normal limits  Orthopedic: Normal joint ROM without pain or crepitus bilaterally. No visible deformities. No bony tenderness.   Radiographs: None Assessment:    1. Onychomycosis due to dermatophyte   2. Nail fungus    Plan:  Patient was evaluated and treated and all questions answered.  Onychomycosis dermatophyte -Educated the patient on the etiology of onychomycosis and various treatment options associated with improving the fungal load.  I explained to the patient that there is 3 treatment options available to treat the onychomycosis including topical, p.o., laser treatment.  Dictated the topical application.  Penlac was sent to the pharmacy of asked her apply twice a day.  She states understanding.   No follow-ups on file.

## 2023-05-29 ENCOUNTER — Ambulatory Visit: Payer: Medicare PPO | Admitting: Neurology

## 2023-05-29 ENCOUNTER — Encounter: Payer: Self-pay | Admitting: Neurology

## 2023-05-29 ENCOUNTER — Other Ambulatory Visit (INDEPENDENT_AMBULATORY_CARE_PROVIDER_SITE_OTHER): Payer: Medicare PPO

## 2023-05-29 VITALS — BP 140/76 | HR 73 | Ht 63.0 in | Wt 252.0 lb

## 2023-05-29 DIAGNOSIS — R202 Paresthesia of skin: Secondary | ICD-10-CM | POA: Diagnosis not present

## 2023-05-29 DIAGNOSIS — T451X5A Adverse effect of antineoplastic and immunosuppressive drugs, initial encounter: Secondary | ICD-10-CM

## 2023-05-29 DIAGNOSIS — G620 Drug-induced polyneuropathy: Secondary | ICD-10-CM

## 2023-05-29 DIAGNOSIS — R200 Anesthesia of skin: Secondary | ICD-10-CM

## 2023-05-29 NOTE — Patient Instructions (Addendum)
I saw you today for numbness, tingling, and burning in your legs. Your symptoms are likely due to nerve damage (neuropathy). This was likely caused by your chemotherapy for colon cancer.  You can try Lidocaine cream as needed for burning and tingling in your legs. Apply wear you have pain, tingling, or burning. Wear gloves to prevent your hands being numb. This can be bought over the counter at any drug store or online.  Alpha lipoic acid 600mg  daily has some research data suggesting it helps with nerve health. No major side effects other than <1% of people report upset stomach. This can be taken twice per day (1200mg  daily) if no relief obtained. You can buy this over the counter or online.  We discussed a medication for tingling and burning that is good for chemotherapy induced neuropathy, Cymbalta. You would like to see how the over the counter options work first. Call if you change you mind about this.  Recommend the following measures that may provide some symptomatic benefit for muscle twitching and/or cramps: - Adequate oral clear fluid intake to maintain optimal hydration (about 2.5 liters, or around 8-10 glasses per day) Avoidance of caffeine Trial of DIET tonic water: About 1 glass, up to 6 times daily Magnesium oxide up to 400 mg by mouth twice daily, as needed (over the counter) Gentle muscle stretching routine, especially before bedtime  I will see you back in clinic in 6 months or sooner if needed. Please let me know if you have any questions or concerns in the meantime.   The physicians and staff at [facility] are committed to providing excellent care. You may receive a survey requesting feedback about your experience at our office. We strive to receive "very good" responses to the survey questions. If you feel that your experience would prevent you from giving the office a "very good " response, please contact our office to try to remedy the situation. We may be reached at  [phone]. Thank you for taking the time out of your busy day to complete the survey.  Jacquelyne Balint, MD [facility]  Preventing Falls at [facility] are common, often dreaded events in the lives of older people. Aside from the obvious injuries and even death that may result, fall can cause wide-ranging consequences including loss of independence, mental decline, decreased activity and mobility. Younger people are also at risk of falling, especially those with chronic illnesses and fatigue.  Ways to reduce risk for falling Examine diet and medications. Warm foods and alcohol dilate blood vessels, which can lead to dizziness when standing. Sleep aids, antidepressants and pain medications can also increase the likelihood of a fall.  Get a vision exam. Poor vision, cataracts and glaucoma increase the chances of falling.  Check foot gear. Shoes should fit snugly and have a sturdy, nonskid sole and a broad, low heel  Participate in a physician-approved exercise program to build and maintain muscle strength and improve balance and coordination. Programs that use ankle weights or stretch bands are excellent for muscle-strengthening. Water aerobics programs and low-impact Tai Chi programs have also been shown to improve balance and coordination.  Increase vitamin D intake. Vitamin D improves muscle strength and increases the amount of calcium the body is able to absorb and deposit in bones.  How to prevent falls from common hazards Floors - Remove all loose wires, cords, and throw rugs. Minimize clutter. Make sure rugs are anchored and smooth. Keep furniture in its usual place.  Chairs --  Use chairs with straight backs, armrests and firm seats. Add firm cushions to existing pieces to add height.  Bathroom - Install grab bars and non-skid tape in the tub or shower. Use a bathtub transfer bench or a shower chair with a back support Use an elevated toilet seat and/or safety rails to assist  standing from a low surface. Do not use towel racks or bathroom tissue holders to help you stand.  Lighting - Make sure halls, stairways, and entrances are well-lit. Install a night light in your bathroom or hallway. Make sure there is a light switch at the top and bottom of the staircase. Turn lights on if you get up in the middle of the night. Make sure lamps or light switches are within reach of the bed if you have to get up during the night.  Kitchen - Install non-skid rubber mats near the sink and stove. Clean spills immediately. Store frequently used utensils, pots, pans between waist and eye level. This helps prevent reaching and bending. Sit when getting things out of lower cupboards.  Living room/ Bedrooms - Place furniture with wide spaces in between, giving enough room to move around. Establish a route through the living room that gives you something to hold onto as you walk.  Stairs - Make sure treads, rails, and rugs are secure. Install a rail on both sides of the stairs. If stairs are a threat, it might be helpful to arrange most of your activities on the lower level to reduce the number of times you must climb the stairs.  Entrances and doorways - Install metal handles on the walls adjacent to the doorknobs of all doors to make it more secure as you travel through the doorway.  Tips for maintaining balance Keep at least one hand free at all times. Try using a backpack or fanny pack to hold things rather than carrying them in your hands. Never carry objects in both hands when walking as this interferes with keeping your balance.  Attempt to swing both arms from front to back while walking. This might require a conscious effort if Parkinson's disease has diminished your movement. It will, however, help you to maintain balance and posture, and reduce fatigue.  Consciously lift your feet off of the ground when walking. Shuffling and dragging of the feet is a common culprit in losing your  balance.  When trying to navigate turns, use a "U" technique of facing forward and making a wide turn, rather than pivoting sharply.  Try to stand with your feet shoulder-length apart. When your feet are close together for any length of time, you increase your risk of losing your balance and falling.  Do one thing at a time. Don't try to walk and accomplish another task, such as reading or looking around. The decrease in your automatic reflexes complicates motor function, so the less distraction, the better.  Do not wear rubber or gripping soled shoes, they might "catch" on the floor and cause tripping.  Move slowly when changing positions. Use deliberate, concentrated movements and, if needed, use a grab bar or walking aid. Count 15 seconds between each movement. For example, when rising from a seated position, wait 15 seconds after standing to begin walking.  If balance is a continuous problem, you might want to consider a walking aid such as a cane, walking stick, or walker. Once you've mastered walking with help, you might be ready to try it on your own again.

## 2023-06-07 LAB — IMMUNOFIXATION ELECTROPHORESIS
IgG (Immunoglobin G), Serum: 1227 mg/dL (ref 600–1540)
IgM, Serum: 48 mg/dL — ABNORMAL LOW (ref 50–300)
Immunoglobulin A: 274 mg/dL (ref 70–320)

## 2023-07-27 NOTE — Progress Notes (Unsigned)
Follow Up Note  RE: Sharon Allison MRN: [id number] DOB: [birth year] Date of Office Visit: 07/28/2023  Referring provider: Marisue Brooklyn Primary care provider: Esperanza Richters, PA-C  Chief Complaint: No chief complaint on file.  History of Present Illness: I had the pleasure of seeing Zeta Schaum for a follow up visit at the [facility] on 07/27/2023. She is a 67 y.o. female, who is being followed for asthma and chronic rhinitis. Her previous allergy office visit was on 03/18/2022 with Dr. Selena Batten. Today is a regular follow up visit.  Discussed the use of AI scribe software for clinical note transcription with the patient, who gave verbal consent to proceed.  History of Present Illness            Moderate persistent asthma without complication Controlled with below regimen. Some DOE. Printmaker.  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  Daily controller medication(s): Decrease Symbicort to ONE puff once a day. If you notice any symptoms then go back up to 2 puffs once a day.  Prior to physical activity: May use albuterol rescue inhaler 2 puffs 5 to 15 minutes prior to strenuous physical activities. Rescue medications: May use albuterol rescue inhaler 2 puffs or nebulizer every 4 to 6 hours as needed for shortness of breath, chest tightness, coughing, and wheezing. Monitor frequency of use.  During upper respiratory infections/asthma flares: Start Singulair 10mg  daily.  Increase Symbicort to 2 puffs twice a day for 1-2 weeks. Get spirometry at next visit. Get flu shot in the fall.    Chronic rhinitis Some nasal congestion.  Use over the counter antihistamines such as Zyrtec (cetirizine), Claritin (loratadine), Allegra (fexofenadine), or Xyzal (levocetirizine) daily as needed. May switch antihistamines every few months. May use Flonase 1-2 sprays per nostril daily for nasal congestion. For the dry sinuses: Use a cool mist humidifier  during the winter months. May use saline gel in the nose as needed.     Assessment and Plan: Sharon Allison is a 67 y.o. female with: Moderate persistent asthma without complication Controlled with below regimen. Some DOE. Printmaker.  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  Daily controller medication(s): Decrease Symbicort to ONE puff once a day. If you notice any symptoms then go back up to 2 puffs once a day.  Prior to physical activity: May use albuterol rescue inhaler 2 puffs 5 to 15 minutes prior to strenuous physical activities. Rescue medications: May use albuterol rescue inhaler 2 puffs or nebulizer every 4 to 6 hours as needed for shortness of breath, chest tightness, coughing, and wheezing. Monitor frequency of use.  During upper respiratory infections/asthma flares: Start Singulair 10mg  daily.  Increase Symbicort to 2 puffs twice a day for 1-2 weeks. Get spirometry at next visit. Get flu shot in the fall.    Chronic rhinitis Some nasal congestion.  Use over the counter antihistamines such as Zyrtec (cetirizine), Claritin (loratadine), Allegra (fexofenadine), or Xyzal (levocetirizine) daily as needed. May switch antihistamines every few months. May use Flonase 1-2 sprays per nostril daily for nasal congestion. For the dry sinuses: Use a cool mist humidifier during the winter months. May use saline gel in the nose as needed.    Assessment and Plan              No follow-ups on file.  No orders of the defined types were placed in this encounter.  Lab Orders  No laboratory test(s) ordered today  Diagnostics: Spirometry:  Tracings reviewed. Her effort: {Blank single:19197::"Good reproducible efforts.","It was hard to get consistent efforts and there is a question as to whether this reflects a maximal maneuver.","Poor effort, data can not be interpreted."} FVC: ***L FEV1: ***L, ***% predicted FEV1/FVC ratio: ***% Interpretation: {Blank  single:19197::"Spirometry consistent with mild obstructive disease","Spirometry consistent with moderate obstructive disease","Spirometry consistent with severe obstructive disease","Spirometry consistent with possible restrictive disease","Spirometry consistent with mixed obstructive and restrictive disease","Spirometry uninterpretable due to technique","Spirometry consistent with normal pattern","No overt abnormalities noted given today's efforts"}.  Please see scanned spirometry results for details.  Skin Testing: {Blank single:19197::"Select foods","Environmental allergy panel","Environmental allergy panel and select foods","Food allergy panel","None","Deferred due to recent antihistamines use"}. *** Results discussed with patient/family.   Medication List:  Current Outpatient Medications  Medication Sig Dispense Refill   albuterol (VENTOLIN HFA) 108 (90 Base) MCG/ACT inhaler Inhale 2 puffs into the lungs every 4 (four) hours as needed for wheezing or shortness of breath. 18 g 1   atorvastatin (LIPITOR) 10 MG tablet Take 1 tablet (10 mg total) by mouth daily. 90 tablet 3   cholecalciferol (VITAMIN D3) 25 MCG (1000 UNIT) tablet Take 1,000 Units by mouth daily.     ciclopirox (PENLAC) 8 % solution Apply topically at bedtime. Apply over nail and surrounding skin. Apply daily over previous coat. After seven (7) days, may remove with alcohol and continue cycle. 6 each 0   levocetirizine (XYZAL) 5 MG tablet TAKE 1 TABLET BY MOUTH AS NEEDED FOR ALLERGIES 90 tablet 0   montelukast (SINGULAIR) 10 MG tablet TAKE 1 TABLET BY MOUTH AT BEDTIME 90 tablet 0   Multiple Vitamin (MULTIVITAMIN WITH MINERALS) TABS tablet Take 1 tablet by mouth daily.     SYMBICORT 160-4.5 MCG/ACT inhaler Inhale 2 puffs into the lungs in the morning and at bedtime. 10.2 g 5   No current facility-administered medications for this visit.   Facility-Administered Medications Ordered in Other Visits  Medication Dose Route Frequency  Provider Last Rate Last Admin   famotidine (PEPCID) IVPB 20 mg premix  20 mg Intravenous Q12H Tanner, Zenaida Niece E., PA-C   Stopped at 01/21/18 1528   heparin lock flush 100 unit/mL  500 Units Intracatheter Once PRN Ladene Artist, MD       sodium chloride flush (NS) 0.9 % injection 10 mL  10 mL Intracatheter PRN Ladene Artist, MD       Allergies: Allergies  Allergen Reactions   Oxaliplatin Shortness Of Breath    Sweating    Wellbutrin [Bupropion] Itching and Other (See Comments)    Facial redness   Iohexol Swelling    Patient had swelling and itching to face in 1980's. Had 13 hour pre meds before scan 10/27/17   Iodine Swelling   Sulfa Antibiotics Itching and Other (See Comments)   I reviewed her past medical history, social history, family history, and environmental history and no significant changes have been reported from her previous visit.  Review of Systems  Constitutional:  Negative for appetite change, chills, fever and unexpected weight change.  HENT:  Positive for congestion. Negative for postnasal drip and rhinorrhea.   Eyes:  Negative for itching.  Respiratory:  Negative for cough, chest tightness, shortness of breath and wheezing.   Gastrointestinal:  Negative for abdominal pain.  Skin:  Negative for rash.  Neurological:  Negative for headaches.    Objective: LMP 07/10/2001 (LMP Unknown)  There is no height or weight on file to calculate BMI. Physical Exam Vitals and nursing note  reviewed.  Constitutional:      Appearance: Normal appearance. She is well-developed.  HENT:     Head: Normocephalic and atraumatic.     Right Ear: External ear normal.     Left Ear: External ear normal.     Nose: Nose normal.     Mouth/Throat:     Mouth: Mucous membranes are moist.     Pharynx: Oropharynx is clear.  Eyes:     Conjunctiva/sclera: Conjunctivae normal.  Cardiovascular:     Rate and Rhythm: Normal rate and regular rhythm.     Heart sounds: Normal heart sounds. No  murmur heard.    No friction rub. No gallop.  Pulmonary:     Effort: Pulmonary effort is normal.     Breath sounds: Normal breath sounds. No wheezing or rales.  Musculoskeletal:     Cervical back: Neck supple.  Skin:    General: Skin is warm.     Findings: No rash.  Neurological:     Mental Status: She is alert and oriented to person, place, and time.  Psychiatric:        Behavior: Behavior normal.    Previous notes and tests were reviewed. The plan was reviewed with the patient/family, and all questions/concerned were addressed.  It was my pleasure to see Bailee today and participate in her care. Please feel free to contact me with any questions or concerns.  Sincerely,  Wyline Mood, DO Allergy & Immunology  [facility] office: [phone] [facility] office: [phone]

## 2023-07-28 ENCOUNTER — Other Ambulatory Visit (HOSPITAL_BASED_OUTPATIENT_CLINIC_OR_DEPARTMENT_OTHER): Payer: Self-pay

## 2023-07-28 ENCOUNTER — Other Ambulatory Visit: Payer: Self-pay

## 2023-07-28 ENCOUNTER — Encounter: Payer: Self-pay | Admitting: Allergy

## 2023-07-28 ENCOUNTER — Ambulatory Visit: Payer: Medicare PPO | Admitting: Allergy

## 2023-07-28 VITALS — HR 75 | Temp 98.0°F | Resp 18 | Ht 63.0 in | Wt 256.5 lb

## 2023-07-28 DIAGNOSIS — J310 Chronic rhinitis: Secondary | ICD-10-CM

## 2023-07-28 DIAGNOSIS — J4540 Moderate persistent asthma, uncomplicated: Secondary | ICD-10-CM

## 2023-07-28 MED ORDER — ALBUTEROL SULFATE HFA 108 (90 BASE) MCG/ACT IN AERS: 2.0000 | INHALATION_SPRAY | RESPIRATORY_TRACT | 1 refills | Status: AC | PRN

## 2023-07-28 MED ORDER — COVID-19 MRNA VAC-TRIS(PFIZER) 30 MCG/0.3ML IM SUSY
0.3000 mL | PREFILLED_SYRINGE | Freq: Once | INTRAMUSCULAR | 0 refills | Status: AC
  Filled 2023-07-28: qty 0.3, 1d supply, fill #0

## 2023-07-28 MED ORDER — LEVOCETIRIZINE DIHYDROCHLORIDE 5 MG PO TABS: 5.0000 mg | ORAL_TABLET | Freq: Every evening | ORAL | 1 refills | Status: AC

## 2023-07-28 MED ORDER — MONTELUKAST SODIUM 10 MG PO TABS: 10.0000 mg | ORAL_TABLET | Freq: Every day | ORAL | 1 refills | Status: DC

## 2023-07-28 MED ORDER — FLUTICASONE PROPIONATE 50 MCG/ACT NA SUSP: 1.0000 | Freq: Every day | NASAL | 5 refills | Status: AC | PRN

## 2023-07-28 MED ORDER — BUDESONIDE-FORMOTEROL FUMARATE 160-4.5 MCG/ACT IN AERO: 2.0000 | INHALATION_SPRAY | Freq: Two times a day (BID) | RESPIRATORY_TRACT | 5 refills | Status: AC | PRN

## 2023-07-28 NOTE — Patient Instructions (Addendum)
Asthma: May use Symbicort 1-2 puffs as needed 1-2 times per day.  During respiratory infections/flares:  Start Symbicort 2 puffs twice a day with spacer and rinse mouth afterwards for 1-2 weeks until your breathing symptoms return to baseline.  May use albuterol rescue inhaler 2 puffs every 4 to 6 hours as needed for shortness of breath, chest tightness, coughing, and wheezing. May use albuterol rescue inhaler 2 puffs 5 to 15 minutes prior to strenuous physical activities. Monitor frequency of use - if you need to use it more than twice per week on a consistent basis let us know.  Breathing control goals:  Full participation in all desired activities (may need albuterol before activity) Albuterol use two times or less a week on average (not counting use with activity) Cough interfering with sleep two times or less a month Oral steroids no more than once a year No hospitalizations   Rhinitis  Use over the counter antihistamines such as Zyrtec (cetirizine), Claritin (loratadine), Allegra (fexofenadine), or Xyzal (levocetirizine) daily as needed. May switch antihistamines every few months. May use Singulair (montelukast) 10mg  daily at night. Use Flonase (fluticasone) nasal spray 1-2 sprays per nostril once a day as needed for nasal congestion.  Nasal saline spray (i.e., Simply Saline) or nasal saline lavage (i.e., NeilMed) is recommended as needed and prior to medicated nasal sprays.  Follow up in 12 months or sooner if needed.  Flu vaccine - you need the high dose due to age which I don't have in my office. Recommend to get flu vaccine in pharmacy or at your PCP's office.

## 2023-08-14 ENCOUNTER — Other Ambulatory Visit (HOSPITAL_BASED_OUTPATIENT_CLINIC_OR_DEPARTMENT_OTHER): Payer: Self-pay

## 2023-08-14 MED ORDER — INFLUENZA VAC A&B SURF ANT ADJ 0.5 ML IM SUSY
0.5000 mL | PREFILLED_SYRINGE | Freq: Once | INTRAMUSCULAR | 0 refills | Status: AC
  Filled 2023-08-14: qty 0.5, 1d supply, fill #0

## 2023-08-20 ENCOUNTER — Other Ambulatory Visit: Payer: Self-pay | Admitting: Medical

## 2023-08-20 DIAGNOSIS — Z1231 Encounter for screening mammogram for malignant neoplasm of breast: Secondary | ICD-10-CM

## 2023-08-20 DIAGNOSIS — Z780 Asymptomatic menopausal state: Secondary | ICD-10-CM

## 2023-10-06 ENCOUNTER — Ambulatory Visit
Admission: RE | Admit: 2023-10-06 | Discharge: 2023-10-06 | Disposition: A | Discharge status: Home / Self Care | Payer: Medicare PPO | Source: Ambulatory Visit | Attending: Medical | Admitting: Medical

## 2023-10-06 DIAGNOSIS — Z1231 Encounter for screening mammogram for malignant neoplasm of breast: Secondary | ICD-10-CM | POA: Diagnosis not present

## 2023-10-08 ENCOUNTER — Encounter: Payer: Self-pay | Admitting: Medical

## 2023-10-17 ENCOUNTER — Other Ambulatory Visit: Payer: Self-pay | Admitting: Medical

## 2023-11-07 ENCOUNTER — Inpatient Hospital Stay: Payer: Medicare PPO | Attending: Oncology | Admitting: Oncology

## 2023-11-07 ENCOUNTER — Other Ambulatory Visit (HOSPITAL_BASED_OUTPATIENT_CLINIC_OR_DEPARTMENT_OTHER): Payer: Self-pay

## 2023-11-07 ENCOUNTER — Inpatient Hospital Stay: Payer: Medicare PPO

## 2023-11-07 ENCOUNTER — Other Ambulatory Visit: Payer: Medicare PPO

## 2023-11-07 VITALS — BP 142/74 | HR 74 | Temp 98.1°F | Resp 18 | Ht 63.0 in | Wt 256.5 lb

## 2023-11-07 DIAGNOSIS — T451X5A Adverse effect of antineoplastic and immunosuppressive drugs, initial encounter: Secondary | ICD-10-CM | POA: Diagnosis not present

## 2023-11-07 DIAGNOSIS — D123 Benign neoplasm of transverse colon: Secondary | ICD-10-CM | POA: Diagnosis not present

## 2023-11-07 DIAGNOSIS — J45909 Unspecified asthma, uncomplicated: Secondary | ICD-10-CM | POA: Diagnosis not present

## 2023-11-07 DIAGNOSIS — C182 Malignant neoplasm of ascending colon: Secondary | ICD-10-CM

## 2023-11-07 DIAGNOSIS — R197 Diarrhea, unspecified: Secondary | ICD-10-CM | POA: Diagnosis not present

## 2023-11-07 DIAGNOSIS — R200 Anesthesia of skin: Secondary | ICD-10-CM | POA: Diagnosis not present

## 2023-11-07 DIAGNOSIS — Z9049 Acquired absence of other specified parts of digestive tract: Secondary | ICD-10-CM | POA: Insufficient documentation

## 2023-11-07 DIAGNOSIS — R202 Paresthesia of skin: Secondary | ICD-10-CM | POA: Insufficient documentation

## 2023-11-07 DIAGNOSIS — C180 Malignant neoplasm of cecum: Secondary | ICD-10-CM | POA: Diagnosis not present

## 2023-11-07 DIAGNOSIS — Z79899 Other long term (current) drug therapy: Secondary | ICD-10-CM | POA: Insufficient documentation

## 2023-11-07 DIAGNOSIS — D6959 Other secondary thrombocytopenia: Secondary | ICD-10-CM | POA: Insufficient documentation

## 2023-11-07 LAB — CEA (ACCESS): CEA (CHCC): 1.15 ng/mL (ref 0.00–5.00)

## 2023-11-07 MED ORDER — SHINGRIX 50 MCG/0.5ML IM SUSR
0.5000 mL | Freq: Once | INTRAMUSCULAR | 0 refills | Status: AC
  Filled 2023-11-07: qty 0.5, 1d supply, fill #0

## 2023-11-07 NOTE — Progress Notes (Signed)
  [facility] OFFICE PROGRESS NOTE   Diagnosis: Colon cancer  INTERVAL HISTORY:   Sharon Allison returns as scheduled.  She feels well.  Good appetite.  No difficulty with bowel function.  No bleeding.  She continues to have intermittent numbness and tingling in the feet.  She notices it is chiefly at night and when sitting.  Objective:  Vital signs in last 24 hours:  Blood pressure (!) 142/74, pulse 74, temperature 98.1 F (36.7 C), temperature source Temporal, resp. rate 18, height 5\' 3"  (1.6 m), weight 256 lb 8 oz (116.3 kg), last menstrual period 07/10/2001, SpO2 98%.    Lymphatics: No cervical, supraclavicular, axillary, or inguinal nodes Resp: Lungs clear bilaterally Cardio: Regular rate and rhythm GI: No hepatosplenomegaly, no mass, nontender Vascular: No leg edema   Lab Results:  Lab Results  Component Value Date   WBC 7.0 10/17/2022   HGB 14.0 10/17/2022   HCT 41.6 10/17/2022   MCV 92.6 10/17/2022   PLT 270.0 10/17/2022   NEUTROABS 4.0 10/17/2022    CMP  Lab Results  Component Value Date   NA 139 10/17/2022   K 4.1 10/17/2022   CL 102 10/17/2022   CO2 28 10/17/2022   GLUCOSE 90 10/17/2022   BUN 11 10/17/2022   CREATININE 0.67 10/17/2022   CALCIUM 10.1 10/17/2022   PROT 6.8 10/17/2022   ALBUMIN 4.1 10/17/2022   AST 15 10/17/2022   ALT 16 10/17/2022   ALKPHOS 87 10/17/2022   BILITOT 0.5 10/17/2022   GFRNONAA >60 08/02/2020   GFRAA >60 08/13/2019    Lab Results  Component Value Date   CEA1 1.09 01/22/2021   CEA 1.15 11/07/2023    Medications: I have reviewed the patient's current medications.   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 MSI-stable 3/14 lymph nodes positive for metastatic carcinoma, lymphovascular invasion present Cycle 1 FOLFOX 10/22/2017 CTs chest, abdomen, and pelvis 10/28/2017- negative for metastatic disease Cycle 2 FOLFOX 11/05/2017 Cycle 3  FOLFOX 11/27/2017 Cycle 4 FOLFOX 12/10/2017 Cycle 5 FOLFOX 12/24/2017 Cycle 6 FOLFOX 01/08/2018 (Oxaliplatin held due to thrombocytopenia) Cycle 7 FOLFOX 01/21/2018-oxaliplatin resumed Cycle 8 FOLFOX 02/04/2018-Oxaliplatin eliminated due to an allergic reaction with cycle 7 Cycle 9 FOLFOX 03/04/2018- no Oxaliplatin; 5-FU bolus eliminated, infusional 5-FU dose reduced Cycle 10 FOLFOX 03/17/2018 Cycle 11 FOLFOX 03/31/2018 Cycle 12 FOLFOX 04/14/2018 Surveillance colonoscopy 06/30/2018-tubular adenoma removed from the transverse colon CTs 08/25/2018-negative CTs 08/13/2019-no evidence of recurrent colon cancer, focal fatty change in the left liver CTs 08/02/2020-no evidence of recurrent disease Colonoscopy 09/07/2021-negative CTs 02/21/2022-no evidence of recurrent or metastatic disease in the chest, abdomen or pelvis.   2.   Asthma   3.   Port-A-Cath placement 10/17/2017   4.   History of neutropenia secondary to chemotherapy-resolved   5.   History of thrombocytopenia secondary to chemotherapy   6.   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   Diarrhea following cycle 8 FOLFOX-resolved, 5-FU bolus eliminated and infusion dose decreased beginning with cycle 9    Disposition: Sharon Allison remains in clinical remission from colon cancer.  He would like to continue follow-up with the cancer center.  She will return for an office visit in 1 year.  She will continue colonoscopy surveillance with Dr Elnoria Howard.  Thornton Papas, MD  11/07/2023  11:56 AM

## 2023-11-18 NOTE — Progress Notes (Signed)
 I saw Sharon Allison in neurology clinic on 11/26/23 in follow up for numbness and tingling in the feet.  HPI: Sharon Allison is a 68 y.o. year old female with a history of colon cancer, HTN, HLD, OSA (on CPAP), vit D deficiency, asthma, GERD who we last saw on 05/29/23.  To briefly review: Patient's symptoms started with burning in feet and tingling. It initially was intermittent. It would mostly happen when she was not moving or at night in bed. She does not get symptoms as much when she is moving around. Symptoms started during chemotherapy for colon cancer (FOLFOX), about 2 weeks into chemo (2019). She rates the discomfort as 3/10. It mostly prevents her falling asleep on some nights. Over time, the symptoms have spread into calves. She had some symptoms in her hands during chemo, but this pretty much went away. There is only very occasional, mild tingling in hands. Patient has never been on medication before.   She endorses some imbalance, but denies falls.   She does endorse occasional cramps in her legs (calves) that wake her up in sleep, but this has not happened in a while.   The patient does not report symptoms referable to autonomic dysfunction including impaired sweating, heat or cold intolerance, excessive mucosal dryness, gastroparetic early satiety, postprandial abdominal bloating, constipation, bowel or bladder dyscontrol, or syncope/presyncope/orthostatic intolerance.   She does not report any constitutional symptoms like fever, night sweats, anorexia or unintentional weight loss.   EtOH use: Very rare (maybe once per year)  Restrictive diet? No Family history of neuropathy/myopathy/neurologic disease? Brother with neuropathy (?DM)   Cancer history per oncology note from 05/08/23: Adenocarcinoma of the cecum, stage IIIa (T1,N1b), status post a right colectomy 09/18/2017 MSI-stable 3/14 lymph nodes positive for metastatic carcinoma, lymphovascular invasion present Cycle 1  FOLFOX 10/22/2017 CTs chest, abdomen, and pelvis 10/28/2017- negative for metastatic disease Cycle 2 FOLFOX 11/05/2017 Cycle 3 FOLFOX 11/27/2017 Cycle 4 FOLFOX 12/10/2017 Cycle 5 FOLFOX 12/24/2017 Cycle 6 FOLFOX 01/08/2018 (Oxaliplatin held due to thrombocytopenia) Cycle 7 FOLFOX 01/21/2018-oxaliplatin resumed Cycle 8 FOLFOX 02/04/2018-Oxaliplatin eliminated due to an allergic reaction with cycle 7 Cycle 9 FOLFOX 03/04/2018- no Oxaliplatin; 5-FU bolus eliminated, infusional 5-FU dose reduced Cycle 10 FOLFOX 03/17/2018 Cycle 11 FOLFOX 03/31/2018 Cycle 12 FOLFOX 04/14/2018 Surveillance colonoscopy 06/30/2018-tubular adenoma removed from the transverse colon CTs 08/25/2018-negative CTs 08/13/2019-no evidence of recurrent colon cancer, focal fatty change in the left liver CTs 08/02/2020-no evidence of recurrent disease Colonoscopy 09/07/2021-negative CTs 02/21/2022-no evidence of recurrent or metastatic disease in the chest, abdomen or pelvis.  Most recent Assessment and Plan (05/29/23): Sharon Allison is a 68 y.o. female who presents for evaluation of numbness, tingling, and burning in legs. She has a relevant medical history of colon cancer, HTN, HLD, OSA (on CPAP), vit D deficiency, asthma, GERD. Her neurological examination is pertinent for mildly diminished sensation in bilateral feet. Available diagnostic data is significant for HbA1c of 5.3, B1 within normal limits, and B12 within normal limits. 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 Oxaliplatin is the likely cause of her symptoms.   PLAN: -Blood work: IFE -Lidocaine cream PRN -Aphla lipoic acid 600 mg once or twice daily -Discuss prescription medication for burning and tingling, Cymbalta, as it has the most evidence in chemotherapy induced neuropathy. Patient would like to try over the counter medications first though. -Cramp recommendations given (see AVS)  Since their last  visit: Patient's symptoms are  similar to prior. She has not had difficulty sleeping and has not had to buy lidocaine cream. Overall, her symptoms are minor and not too bothersome. Her cramps are also minimal. She has not had to take anything for this.  She mentions some new leg pain (calves and knee). She associates this with new activity and exercise as it is different from her neuropathy pain.   MEDICATIONS:  Outpatient Encounter Medications as of 11/26/2023  Medication Sig   albuterol (VENTOLIN HFA) 108 (90 Base) MCG/ACT inhaler Inhale 2 puffs into the lungs every 4 (four) hours as needed for wheezing or shortness of breath.   atorvastatin (LIPITOR) 10 MG tablet Take 1 tablet by mouth once daily   budesonide-formoterol (SYMBICORT) 160-4.5 MCG/ACT inhaler Inhale 2 puffs into the lungs 2 (two) times daily as needed (asthma symptoms). with spacer and rinse mouth afterwards.   cholecalciferol (VITAMIN D3) 25 MCG (1000 UNIT) tablet Take 1,000 Units by mouth daily.   fluticasone (FLONASE) 50 MCG/ACT nasal spray Place 1-2 sprays into both nostrils daily as needed (nasal congestion).   levocetirizine (XYZAL) 5 MG tablet Take 1 tablet (5 mg total) by mouth every evening.   montelukast (SINGULAIR) 10 MG tablet Take 1 tablet (10 mg total) by mouth at bedtime. (Patient taking differently: Take 10 mg by mouth as needed.)   Multiple Vitamin (MULTIVITAMIN WITH MINERALS) TABS tablet Take 1 tablet by mouth daily.   Facility-Administered Encounter Medications as of 11/26/2023  Medication   famotidine (PEPCID) IVPB 20 mg premix   heparin lock flush 100 unit/mL   sodium chloride flush (NS) 0.9 % injection 10 mL    PAST MEDICAL HISTORY: Past Medical History:  Diagnosis Date   Allergy    Anemia    Asthma    colon ca dx'd 09/2017   colon cancer   Colon polyp    GERD (gastroesophageal reflux disease)    Heart murmur    per patient " dr white told me i had a murmur but i told him they told me that when i  was a kid and it had never bothered me "    History of colon cancer    Hyperlipidemia    Hypertension    Shortness of breath on exertion    Sleep apnea    uses cpap on auto set   Vitamin D deficiency    Wears glasses     PAST SURGICAL HISTORY: Past Surgical History:  Procedure Laterality Date   ABDOMINAL HYSTERECTOMY     2002   COLONOSCOPY N/A 09/18/2017   Procedure: COLONOSCOPY;  Surgeon: Sharon Meuse, MD;  Location: [facility];  Service: General;  Laterality: N/A;   COLONOSCOPY WITH PROPOFOL N/A 06/30/2018   Procedure: COLONOSCOPY WITH PROPOFOL;  Surgeon: Sharon Elizabeth, MD;  Location: [facility];  Service: Endoscopy;  Laterality: N/A;   COLONOSCOPY WITH PROPOFOL N/A 09/07/2021   Procedure: COLONOSCOPY WITH PROPOFOL;  Surgeon: Sharon Hawking, MD;  Location: [facility];  Service: Endoscopy;  Laterality: N/A;   colonscopy  09/07/2021   LAPAROSCOPIC RIGHT HEMI COLECTOMY Right 09/18/2017   Procedure: LAPAROSCOPIC RIGHT HEMI COLECTOMY ERAS PATHWAY;  Surgeon: Sharon Meuse, MD;  Location: [facility];  Service: General;  Laterality: Right;   MYOMECTOMY  DONE 5 YEARS PRIOR TO HYSTERECTOMY   multiple fibroids   POLYPECTOMY  06/30/2018   Procedure: POLYPECTOMY;  Surgeon: Sharon Elizabeth, MD;  Location: [facility];  Service: Endoscopy;;   PORT-A-CATH REMOVAL N/A 09/12/2021   Procedure: REMOVAL PORT-A-CATH;  Surgeon: Sharon Allison,  Sharon Coup, MD;  Location[facility];  Service: General;  Laterality: N/A;   PORTACATH PLACEMENT N/A 10/17/2017   Procedure: INSERTION PORT-A-CATH;  Surgeon: Sharon Meuse, MD;  Location: [facility];  Service: General;  Laterality: N/A;    ALLERGIES: Allergies  Allergen Reactions   Oxaliplatin Shortness Of Breath    Sweating    Wellbutrin [Bupropion] Itching and Other (See Comments)    Facial redness   Iohexol Swelling    Patient had swelling and itching to face in 1980's. Had 13 hour pre meds before scan 10/27/17   Iodine Swelling    Sulfa Antibiotics Itching and Other (See Comments)    FAMILY HISTORY: Family History  Problem Relation Age of Onset   Hypertension Mother    Leukemia Mother    Diabetes Mother    Obesity Mother    Lupus Father    Leukemia Sister    Hodgkin's lymphoma Maternal Grandmother    Cancer Maternal Grandfather        prostate   Kidney disease Brother    Leukemia Brother    Obesity Other    Allergic rhinitis Neg Hx    Asthma Neg Hx    Eczema Neg Hx    Urticaria Neg Hx    Breast cancer Neg Hx     SOCIAL HISTORY: Social History   Tobacco Use   Smoking status: Never   Smokeless tobacco: Never  Vaping Use   Vaping status: Never Used  Substance Use Topics   Alcohol use: Yes    Comment: rare - Once a year   Drug use: No   Social History   Social History Narrative   Are you right handed or left handed? Right Handed   Are you currently employed ? No    What is your current occupation? Retired but works part time at [employer].    Do you live at home alone? Yes   Who lives with you?    What type of home do you live in: 1 story or 2 story? Lives in a one story home.    Caffiene 2 cups a day    Objective:  Vital Signs:  BP 128/61 (BP Location: Left Arm, Patient Position: Sitting, Cuff Size: Large)   Pulse 72   Ht 5\' 3"  (1.6 m)   Wt 254 lb (115.2 kg)   LMP 07/10/2001 (LMP Unknown)   SpO2 98%   BMI 44.99 kg/m   General: No acute distress.  Patient appears well-groomed.   Head:  Normocephalic/atraumatic Neck: supple Back: No paraspinal tenderness Lungs: Non-labored breathing on room air   Neurological Exam: Mental status: alert and oriented, speech fluent and not dysarthric, language intact.  Cranial nerves: CN I: not tested CN II: pupils equal, round and reactive to light, visual fields intact CN III, IV, VI:  full range of motion, no nystagmus, no ptosis CN V: facial sensation intact. CN VII: upper and lower face symmetric CN VIII: hearing intact CN IX, X:  uvula midline CN XI: sternocleidomastoid and trapezius muscles intact CN XII: tongue midline  Bulk & Tone: normal, no fasciculations. Motor:  muscle strength 5/5 throughout Deep Tendon Reflexes:  2+ throughout, except absent at bilateral ankles.   Sensation:  Pinprick, vibratory, and proprioceptive sensation intact. Finger to nose testing:  Without dysmetria.   Gait:  Normal station and stride.  Romberg with mild sway.   Lab and Test Review: New results: IFE (05/29/23): No M protein  Previously reviewed results: Vit  D (12/16/22): 30.28   10/17/22: B1 wnl B12: 384 HbA1c: 5.3 CMP unremarkable CBC w/ diff unremarkable Lipid panel: tChol 214, LDL 136, TG 132  ASSESSMENT: This is KRISLYNN GRONAU, a 68 y.o. female with: Numbness, tingling, and burning in bilateral feet, likely 2/2 Oxaliplatin chemotherapy from colon cancer. Symptoms are currently minimal. No falls Right leg and knee pain - no abnormalities on examination, may be MSK related due to new activity  Plan: -Again discussed neuropathic pain medications, but patient has minimal symptoms, so will defer for now -Lidocaine cream PRN -Alpha lipoic acid 600 mg once or twice daily -Fall precautions discussed  Return to clinic in 1 year  Total time spent reviewing records, interview, history/exam, documentation, and coordination of care on day of encounter:  30 min  Jacquelyne Balint, MD

## 2023-11-26 ENCOUNTER — Encounter: Payer: Self-pay | Admitting: Neurology

## 2023-11-26 ENCOUNTER — Ambulatory Visit: Payer: Medicare PPO | Admitting: Neurology

## 2023-11-26 VITALS — BP 128/61 | HR 72 | Ht 63.0 in | Wt 254.0 lb

## 2023-11-26 DIAGNOSIS — M79604 Pain in right leg: Secondary | ICD-10-CM

## 2023-11-26 DIAGNOSIS — T451X5A Adverse effect of antineoplastic and immunosuppressive drugs, initial encounter: Secondary | ICD-10-CM | POA: Diagnosis not present

## 2023-11-26 DIAGNOSIS — R202 Paresthesia of skin: Secondary | ICD-10-CM | POA: Diagnosis not present

## 2023-11-26 DIAGNOSIS — G620 Drug-induced polyneuropathy: Secondary | ICD-10-CM | POA: Diagnosis not present

## 2023-11-26 DIAGNOSIS — R200 Anesthesia of skin: Secondary | ICD-10-CM | POA: Diagnosis not present

## 2023-11-26 NOTE — Patient Instructions (Signed)
 You can try Lidocaine cream as needed. Apply wear you have pain, tingling, or burning. Wear gloves to prevent your hands being numb. This can be bought over the counter at any drug store or online.  Alpha lipoic acid 600mg  daily has some research data suggesting it helps with nerve health. No major side effects other than <1% of people report upset stomach. This can be taken twice per day (1200mg  daily) if no relief obtained. You can buy this over the counter or online.   I will see you in 1 year or sooner if needed.  The physicians and staff at [facility] are committed to providing excellent care. You may receive a survey requesting feedback about your experience at our office. We strive to receive "very good" responses to the survey questions. If you feel that your experience would prevent you from giving the office a "very good " response, please contact our office to try to remedy the situation. We may be reached at [phone]. Thank you for taking the time out of your busy day to complete the survey.  Jacquelyne Balint, MD [facility]  Preventing Falls at [facility] are common, often dreaded events in the lives of older people. Aside from the obvious injuries and even death that may result, fall can cause wide-ranging consequences including loss of independence, mental decline, decreased activity and mobility. Younger people are also at risk of falling, especially those with chronic illnesses and fatigue.  Ways to reduce risk for falling Examine diet and medications. Warm foods and alcohol dilate blood vessels, which can lead to dizziness when standing. Sleep aids, antidepressants and pain medications can also increase the likelihood of a fall.  Get a vision exam. Poor vision, cataracts and glaucoma increase the chances of falling.  Check foot gear. Shoes should fit snugly and have a sturdy, nonskid sole and a broad, low heel  Participate in a physician-approved exercise program  to build and maintain muscle strength and improve balance and coordination. Programs that use ankle weights or stretch bands are excellent for muscle-strengthening. Water aerobics programs and low-impact Tai Chi programs have also been shown to improve balance and coordination.  Increase vitamin D intake. Vitamin D improves muscle strength and increases the amount of calcium the body is able to absorb and deposit in bones.  How to prevent falls from common hazards Floors - Remove all loose wires, cords, and throw rugs. Minimize clutter. Make sure rugs are anchored and smooth. Keep furniture in its usual place.  Chairs -- Use chairs with straight backs, armrests and firm seats. Add firm cushions to existing pieces to add height.  Bathroom - Install grab bars and non-skid tape in the tub or shower. Use a bathtub transfer bench or a shower chair with a back support Use an elevated toilet seat and/or safety rails to assist standing from a low surface. Do not use towel racks or bathroom tissue holders to help you stand.  Lighting - Make sure halls, stairways, and entrances are well-lit. Install a night light in your bathroom or hallway. Make sure there is a light switch at the top and bottom of the staircase. Turn lights on if you get up in the middle of the night. Make sure lamps or light switches are within reach of the bed if you have to get up during the night.  Kitchen - Install non-skid rubber mats near the sink and stove. Clean spills immediately. Store frequently used utensils, pots, pans between waist and  eye level. This helps prevent reaching and bending. Sit when getting things out of lower cupboards.  Living room/ Bedrooms - Place furniture with wide spaces in between, giving enough room to move around. Establish a route through the living room that gives you something to hold onto as you walk.  Stairs - Make sure treads, rails, and rugs are secure. Install a rail on both sides of the stairs.  If stairs are a threat, it might be helpful to arrange most of your activities on the lower level to reduce the number of times you must climb the stairs.  Entrances and doorways - Install metal handles on the walls adjacent to the doorknobs of all doors to make it more secure as you travel through the doorway.  Tips for maintaining balance Keep at least one hand free at all times. Try using a backpack or fanny pack to hold things rather than carrying them in your hands. Never carry objects in both hands when walking as this interferes with keeping your balance.  Attempt to swing both arms from front to back while walking. This might require a conscious effort if Parkinson's disease has diminished your movement. It will, however, help you to maintain balance and posture, and reduce fatigue.  Consciously lift your feet off of the ground when walking. Shuffling and dragging of the feet is a common culprit in losing your balance.  When trying to navigate turns, use a "U" technique of facing forward and making a wide turn, rather than pivoting sharply.  Try to stand with your feet shoulder-length apart. When your feet are close together for any length of time, you increase your risk of losing your balance and falling.  Do one thing at a time. Don't try to walk and accomplish another task, such as reading or looking around. The decrease in your automatic reflexes complicates motor function, so the less distraction, the better.  Do not wear rubber or gripping soled shoes, they might "catch" on the floor and cause tripping.  Move slowly when changing positions. Use deliberate, concentrated movements and, if needed, use a grab bar or walking aid. Count 15 seconds between each movement. For example, when rising from a seated position, wait 15 seconds after standing to begin walking.  If balance is a continuous problem, you might want to consider a walking aid such as a cane, walking stick, or walker.  Once you've mastered walking with help, you might be ready to try it on your own again.

## 2023-12-02 DIAGNOSIS — H04123 Dry eye syndrome of bilateral lacrimal glands: Secondary | ICD-10-CM | POA: Diagnosis not present

## 2023-12-02 DIAGNOSIS — H10413 Chronic giant papillary conjunctivitis, bilateral: Secondary | ICD-10-CM | POA: Diagnosis not present

## 2023-12-02 DIAGNOSIS — H25813 Combined forms of age-related cataract, bilateral: Secondary | ICD-10-CM | POA: Diagnosis not present

## 2023-12-30 ENCOUNTER — Ambulatory Visit: Payer: Medicare PPO
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
## 2024-03-17 ENCOUNTER — Telehealth: Payer: Self-pay | Admitting: Medical

## 2024-03-17 NOTE — Telephone Encounter (Signed)
 Copied from CRM [id number]. Topic: Medicare AWV >> Mar 17, 2024  1:46 PM Nathanel DEL wrote: Reason for CRM: LVM 03/17/2024 to schedule AWV. Please schedule Virtual or Telehealth visits ONLY.   Nathanel Paschal; Care Guide Ambulatory Clinical Support [place] [facility] Direct Dial: [phone]

## 2024-04-12 ENCOUNTER — Other Ambulatory Visit: Payer: Medicare PPO

## 2024-06-03 ENCOUNTER — Telehealth: Payer: Self-pay

## 2024-06-03 NOTE — Telephone Encounter (Signed)
 [name] appointment has been rescheduled to Oct 29, 2023, to accommodate the provider's updated office hours. A letter confirming the new appointment date has been mailed to the patient.

## 2024-07-27 ENCOUNTER — Telehealth: Payer: Self-pay | Admitting: Medical

## 2024-07-27 NOTE — Telephone Encounter (Signed)
 Copied from CRM [id number]. Topic: Medicare AWV >> Jul 27, 2024  1:26 PM Nathanel DEL wrote: Called LVM 07/27/2024 to sched AWV. Please schedule in office or virtual visit.   Nathanel Paschal; Care Guide Ambulatory Clinical Support [place] [facility] Direct Dial: [phone]

## 2024-08-20 ENCOUNTER — Ambulatory Visit: Admitting: Internal Medicine

## 2024-08-20 ENCOUNTER — Other Ambulatory Visit: Payer: Self-pay

## 2024-08-20 ENCOUNTER — Encounter: Payer: Self-pay | Admitting: Internal Medicine

## 2024-08-20 VITALS — BP 128/82 | HR 80 | Temp 98.4°F | Resp 16 | Ht 63.0 in | Wt 243.0 lb

## 2024-08-20 DIAGNOSIS — A09 Infectious gastroenteritis and colitis, unspecified: Secondary | ICD-10-CM

## 2024-08-20 DIAGNOSIS — B349 Viral infection, unspecified: Secondary | ICD-10-CM

## 2024-08-20 DIAGNOSIS — J310 Chronic rhinitis: Secondary | ICD-10-CM

## 2024-08-20 DIAGNOSIS — J4541 Moderate persistent asthma with (acute) exacerbation: Secondary | ICD-10-CM

## 2024-08-20 MED ORDER — METHYLPREDNISOLONE ACETATE 40 MG/ML IJ SUSP
40.0000 mg | Freq: Once | INTRAMUSCULAR | Status: AC
  Administered 2024-08-20: 40 mg via INTRAMUSCULAR

## 2024-08-20 MED ORDER — PREDNISONE 10 MG PO TABS: ORAL_TABLET | ORAL | 0 refills | Status: AC

## 2024-08-20 NOTE — Patient Instructions (Addendum)
 Asthma: with acute exacerbation likely due to viral infection.  We are out of the window for tamiflu or paxlovid, so testing is not as important now  Depo medrol  40mg  IM given in clinic  Starting tomorrow: Prednisone  10mg  : Take 2 tablets twice a day for 3 more days, Then take 2 tablets once a day for 1 day., then take 1 tablet once a day for 1 day.  Schedule albuterol  2 puffs twice daily for 72 hours, then 2 puffs daily for 72 hours, then step down to as needed  Continue symbicort  2 puffs twice daily.  After you recover,  go back to chronic management as below  May use Symbicort  160mcg 1-2 puffs as needed 1-2 times per day.  During respiratory infections/flares:  Start Symbicort  160mcg 2 puffs twice a day with spacer and rinse mouth afterwards for 1-2 weeks until your breathing symptoms return to baseline.  May use albuterol  rescue inhaler 2 puffs every 4 to 6 hours as needed for shortness of breath, chest tightness, coughing, and wheezing. May use albuterol  rescue inhaler 2 puffs 5 to 15 minutes prior to strenuous physical activities. Monitor frequency of use - if you need to use it more than twice per week on a consistent basis let us  know.  Breathing control goals:  Full participation in all desired activities (may need albuterol  before activity) Albuterol  use two times or less a week on average (not counting use with activity) Cough interfering with sleep two times or less a month Oral steroids no more than once a year No hospitalizations   Rhinitis  Use over the counter antihistamines such as Zyrtec (cetirizine), Claritin (loratadine), Allegra (fexofenadine), or Xyzal  (levocetirizine) daily as needed. May switch antihistamines every few months. May use Singulair  (montelukast ) 10mg  daily at night. Use Flonase  (fluticasone ) nasal spray 1-2 sprays per nostril once a day as needed for nasal congestion.  Nasal saline spray (i.e., Simply Saline) or nasal saline lavage (i.e., NeilMed) is  recommended as needed and prior to medicated nasal sprays.  For diarrhea, only fluids (gatorade, soup, jello) until diarrhea resolves, then can introduce gentle solids (toast, banana), limit dairy for 2 weeks   Follow up in 12 months or sooner if needed.

## 2024-08-20 NOTE — Progress Notes (Unsigned)
 FOLLOW UP Date of Service/Encounter:  08/20/2024  Subjective:  Sharon Allison (DOB: [birth year]) is a 68 y.o. female who [facility] in re-evaluation of the following: acute visit for illness  History obtained from: chart review and patient.  For Review, LV was on 07/28/23  with Dr. Luke seen for routine follow-up. See below for summary of history and diagnostics.   Today presents for follow-up. Discussed the use of AI scribe software for clinical note transcription with the patient, who gave verbal consent to proceed.  History of Present Illness Sharon Allison is a 68 year old female with asthma who presents with respiratory symptoms and diarrhea.  Respiratory symptoms - Onset since Sunday after exposure to a cold environment - Runny nose, cough, and wheezing - Worsening of symptoms despite usual medications (Symbicort , Xyzal , Singulair ) - Frequent nose blowing and coughing, sometimes requiring bathroom visits - Audible crackling in the chest - Chest pain and sensation of something present in the chest - No fever - no sick contacts - scheduled to get flu vaccine next week   Gastrointestinal symptoms - Diarrhea began on the same day as respiratory symptoms - Diarrhea has persisted daily since onset, may have started slowing down today   Asthma management - Current regimen: Symbicort , two puffs in the morning and two at night - Not using albuterol  inhaler regularly due to uncertainty about its use  Supportive care and preventive measures - Consuming crackers and Gatorade for symptom management - Has not yet received flu vaccination; scheduled for next month  Social history relevant to present illness - Lives alone     All medications reviewed by clinical staff and updated in chart. No new pertinent medical or surgical history except as noted in HPI.  ROS: All others negative except as noted per HPI.   Objective:  BP  128/82 (BP Location: Left Arm, Patient Position: Sitting, Cuff Size: Normal)   Pulse 80   Temp 98.4 F (36.9 C) (Temporal)   Resp 16   Ht 5' 3 (1.6 m)   Wt 243 lb (110.2 kg)   LMP 07/10/2001   SpO2 95%   BMI 43.05 kg/m  Body mass index is 43.05 kg/m. Physical Exam: General Appearance:  Alert, cooperative, no distress, appears stated age  Head:  Normocephalic, without obvious abnormality, atraumatic  Eyes:  Conjunctiva clear, EOM's intact  Ears EACs normal bilaterally and normal TMs bilaterally  Nose: Nares normal, erythematous nasal mucosa with clear rhinorrhea , hypertrophic turbinates, no visible anterior polyps, and septum midline  Throat: Lips, tongue normal; teeth and gums normal, normal posterior oropharynx  Neck: Supple, symmetrical  Lungs:   wheezing throughout, Respirations unlabored, intermittent dry coughing  Heart:  regular rate and rhythm and no murmur, Appears well perfused  Extremities: No edema  Skin: Skin color, texture, turgor normal and no rashes or lesions on visualized portions of skin  Neurologic: No gross deficits   Labs:  Lab Orders  No laboratory test(s) ordered today    Assessment/Plan   Patient Instructions  Asthma: with acute exacerbation likely due to viral infection.  We are out of the window for tamiflu or paxlovid, so testing is not as important now  Depo medrol  40mg  IM given in clinic  Starting tomorrow: Prednisone  10mg  : Take 2 tablets twice a day for 3 more days, Then take 2 tablets once a day for 1 day., then take 1 tablet once a day for 1 day.  Schedule albuterol  2 puffs twice daily for 72 hours, then 2 puffs daily for 72 hours, then step down to as needed  Continue symbicort  2 puffs twice daily.  After you recover,  go back to chronic management as below  May use Symbicort  160mcg 1-2 puffs as needed 1-2 times per day.  During respiratory infections/flares:  Start Symbicort  160mcg 2 puffs twice a day with spacer and rinse mouth  afterwards for 1-2 weeks until your breathing symptoms return to baseline.  May use albuterol  rescue inhaler 2 puffs every 4 to 6 hours as needed for shortness of breath, chest tightness, coughing, and wheezing. May use albuterol  rescue inhaler 2 puffs 5 to 15 minutes prior to strenuous physical activities. Monitor frequency of use - if you need to use it more than twice per week on a consistent basis let us  know.  Breathing control goals:  Full participation in all desired activities (may need albuterol  before activity) Albuterol  use two times or less a week on average (not counting use with activity) Cough interfering with sleep two times or less a month Oral steroids no more than once a year No hospitalizations   Rhinitis  Use over the counter antihistamines such as Zyrtec (cetirizine), Claritin (loratadine), Allegra (fexofenadine), or Xyzal  (levocetirizine) daily as needed. May switch antihistamines every few months. May use Singulair  (montelukast ) 10mg  daily at night. Use Flonase  (fluticasone ) nasal spray 1-2 sprays per nostril once a day as needed for nasal congestion.  Nasal saline spray (i.e., Simply Saline) or nasal saline lavage (i.e., NeilMed) is recommended as needed and prior to medicated nasal sprays.  For diarrhea, only fluids (gatorade, soup, jello) until diarrhea resolves, then can introduce gentle solids (toast, banana), limit dairy for 2 weeks   Follow up in 12 months or sooner if needed.    Other:    Thank you so much for letting me partake in your care today.  Don't hesitate to reach out if you have any additional concerns!  Sharon Springer, MD  [facility] [place], [place]

## 2024-09-21 ENCOUNTER — Other Ambulatory Visit (HOSPITAL_BASED_OUTPATIENT_CLINIC_OR_DEPARTMENT_OTHER): Payer: Self-pay

## 2024-09-21 ENCOUNTER — Encounter: Payer: Self-pay | Admitting: Oncology

## 2024-09-21 MED ORDER — FLUZONE HIGH-DOSE 0.5 ML IM SUSY
0.5000 mL | PREFILLED_SYRINGE | Freq: Once | INTRAMUSCULAR | 0 refills | Status: AC
  Filled 2024-09-21: qty 0.5, 1d supply, fill #0

## 2024-09-25 ENCOUNTER — Other Ambulatory Visit: Payer: Self-pay | Admitting: Allergy

## 2024-10-14 ENCOUNTER — Other Ambulatory Visit: Payer: Self-pay | Admitting: Medical

## 2024-10-14 DIAGNOSIS — Z1231 Encounter for screening mammogram for malignant neoplasm of breast: Secondary | ICD-10-CM

## 2024-10-22 ENCOUNTER — Ambulatory Visit

## 2024-10-28 ENCOUNTER — Other Ambulatory Visit

## 2024-10-28 ENCOUNTER — Ambulatory Visit: Admitting: Oncology

## 2024-11-05 ENCOUNTER — Other Ambulatory Visit: Payer: Medicare PPO

## 2024-11-05 ENCOUNTER — Ambulatory Visit: Payer: Medicare PPO | Admitting: Oncology

## 2024-11-26 ENCOUNTER — Ambulatory Visit: Admitting: Neurology
